# Patient Record
Sex: Male | Born: 1935
Health system: Southern US, Community
[De-identification: ages and names within clinical notes are randomized; demographics above are authoritative.]

## PROBLEM LIST (undated history)

## (undated) DIAGNOSIS — I4891 Unspecified atrial fibrillation: Secondary | ICD-10-CM

## (undated) DIAGNOSIS — I4892 Unspecified atrial flutter: Secondary | ICD-10-CM

## (undated) DIAGNOSIS — G4733 Obstructive sleep apnea (adult) (pediatric): Secondary | ICD-10-CM

## (undated) DIAGNOSIS — E785 Hyperlipidemia, unspecified: Secondary | ICD-10-CM

## (undated) DIAGNOSIS — C801 Malignant (primary) neoplasm, unspecified: Secondary | ICD-10-CM

## (undated) DIAGNOSIS — I499 Cardiac arrhythmia, unspecified: Secondary | ICD-10-CM

## (undated) DIAGNOSIS — M1990 Unspecified osteoarthritis, unspecified site: Secondary | ICD-10-CM

## (undated) HISTORY — DX: Unspecified atrial flutter: I48.92

## (undated) HISTORY — DX: Hyperlipidemia, unspecified: E78.5

## (undated) HISTORY — PX: MELANOMA EXCISION: SHX5266

## (undated) HISTORY — PX: CARDIAC CATHETERIZATION: SHX172

## (undated) HISTORY — DX: Obstructive sleep apnea (adult) (pediatric): G47.33

## (undated) HISTORY — DX: Unspecified atrial fibrillation: I48.91

## (undated) HISTORY — PX: ATRIAL ABLATION SURGERY: SHX560

---

## 2005-10-30 ENCOUNTER — Encounter: Payer: Self-pay | Admitting: Cardiology

## 2005-11-11 ENCOUNTER — Ambulatory Visit: Payer: Self-pay | Admitting: Cardiology

## 2005-11-11 ENCOUNTER — Encounter: Payer: Self-pay | Admitting: Physician Assistant

## 2007-12-16 ENCOUNTER — Encounter: Payer: Self-pay | Admitting: Cardiology

## 2008-09-23 ENCOUNTER — Encounter: Payer: Self-pay | Admitting: Cardiology

## 2008-09-29 ENCOUNTER — Encounter: Payer: Self-pay | Admitting: Cardiology

## 2009-02-25 DIAGNOSIS — E785 Hyperlipidemia, unspecified: Secondary | ICD-10-CM

## 2009-02-25 DIAGNOSIS — I4891 Unspecified atrial fibrillation: Secondary | ICD-10-CM | POA: Insufficient documentation

## 2009-02-25 DIAGNOSIS — E7800 Pure hypercholesterolemia, unspecified: Secondary | ICD-10-CM | POA: Insufficient documentation

## 2009-02-25 DIAGNOSIS — I10 Essential (primary) hypertension: Secondary | ICD-10-CM | POA: Insufficient documentation

## 2009-02-25 DIAGNOSIS — G4733 Obstructive sleep apnea (adult) (pediatric): Secondary | ICD-10-CM

## 2009-03-01 ENCOUNTER — Ambulatory Visit: Payer: Self-pay | Admitting: Cardiology

## 2010-03-29 NOTE — Letter (Signed)
Summary: Discharge Summary/ [facility]  Discharge Summary/ [facility]   Imported By: Dorise Hiss 03/01/2009 11:56:24  _____________________________________________________________________  External Attachment:    Type:   Image     Comment:   External Document

## 2010-03-29 NOTE — Letter (Signed)
Summary: Internal Other/ PATIENT HISTORY FORM  Internal Other/ PATIENT HISTORY FORM   Imported By: Dorise Hiss 03/01/2009 11:51:05  _____________________________________________________________________  External Attachment:    Type:   Image     Comment:   External Document

## 2010-03-29 NOTE — Assessment & Plan Note (Signed)
Summary: NP-AFIB   Visit Type:  Initial Consult Primary Provider:  Dr. Fara Chute  CC:  Atrial Fibrillation.  History of Present Illness: The patient presents for followup of atrial fibrillation. He has had this problem paroxysmally for years. He has only had 3 symptomatic episodes. At one point he was treated with propafenon and and Cardizem.  However, he developed a rash and both of these medications were stopped. He also felt lethargic on beta blockers. He has since been treated with anticoagulation alone. He did have a brief episode recently of feeling slightly lightheaded and noted that he had an irregular pulse with rates in the 100 range. He rested and this lasted for only about an hour. He had no presyncope or syncope. He is active in his daily chores though he does not exercise routinely. With his activities he denies any chest pressure, neck or arm discomfort. He does not have any shortness of breath and denies any PND or orthopnea. He does have sleep apnea and cannot use CPAP.  Preventive Screening-Counseling & Management  Alcohol-Tobacco     Smoking Status: never  Current Medications (verified): 1)  Pravastatin Sodium 20 Mg Tabs (Pravastatin Sodium) .... Take One Tablet By Mouth Daily At Bedtime 2)  Warfarin Sodium 5 Mg Tabs (Warfarin Sodium) .... Use As Directed By Dr. Neita Carp 3)  Multivitamins  Tabs (Multiple Vitamin) .... Take 1 Tablet By Mouth Once A Day 4)  Fish Oil 1000 Mg Caps (Omega-3 Fatty Acids) .... Take 1 Capsule By Mouth Once A Day 5)  Aleve 220 Mg Tabs (Naproxen Sodium) .... As Needed Gout  Allergies (verified): 1)  Lisinopril 2)  Lovenox (Enoxaparin Sodium) 3)  Propafenone Hcl 4)  Cardizem 5)  Metoprolol Succinate  Comments:  Nurse/Medical Assistant: The patient's medications and allergies were reviewed with the patient and were updated in the Medication and Allergy Lists. Pt brought a list.  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   Vital Signs:  Patient profile:   77 year old male Height:      70 inches Weight:      210.50 pounds BMI:     30.31 Pulse rate:   67 / minute BP sitting:   169 / 81  (left arm) Cuff size:   regular  Vitals Entered By: Cyril Loosen, RN, BSN (March 01, 2009 10:09 AM)  Nutrition Counseling: Patient's BMI is greater than 25 and therefore counseled on weight management options. CC: Atrial Fibrillation   Physical Exam  General:  Well developed, well nourished, in no acute distress. Head:  normocephalic and atraumatic Eyes:  PERRLA/EOM intact; conjunctiva and lids normal. Mouth:  Teeth, gums and palate normal. Oral mucosa normal. Neck:  Neck supple, no JVD. No masses, thyromegaly or abnormal cervical nodes. Chest Wall:  no deformities or breast masses noted Lungs:  Clear bilaterally to auscultation and percussion. Abdomen:  Bowel sounds positive; abdomen soft and non-tender without masses, organomegaly, or hernias noted. No hepatosplenomegaly. Msk:  Back normal, normal gait. Muscle strength and tone normal. Extremities:  No clubbing or cyanosis. Neurologic:  Alert and oriented x  3. Skin:  Intact without lesions or rashes. Cervical Nodes:  no significant adenopathy Axillary Nodes:  no significant adenopathy Inguinal Nodes:  no significant adenopathy Psych:  Normal affect.   Detailed Cardiovascular Exam  Neck    Carotids: Carotids full and equal bilaterally without bruits.      Neck Veins: Normal, no JVD.    Heart    Inspection: no deformities or lifts noted.       Palpation: normal PMI with no thrills palpable.      Auscultation: regular rate and rhythm, S1, S2 without murmurs, rubs, gallops, or clicks.    Vascular    Abdominal Aorta: no palpable masses, pulsations, or audible bruits.      Femoral Pulses: normal femoral pulses bilaterally.      Pedal Pulses: normal pedal pulses bilaterally.      Radial Pulses: normal radial pulses bilaterally.      Peripheral Circulation: no clubbing, cyanosis, or edema noted with normal capillary refill.     EKG  Procedure date:  03/01/2009  Findings:      Sinus rhythm, rate 62, axis within normal limits, intervals within normal limits, no acute ST-T wave changes.  Impression & Recommendations:  Problem # 1:  ATRIAL FIBRILLATION (ICD-427.31) The patient has infrequent symptomatic paroxysms of atrial fibrillation. At this point he and I agree that no antiarrhythmic therapy is needed. Rather we will continue with anticoagulation and will let me know if he has increasing paroxysms at which point I would most likely consider therapy with Tikosyn.  Of note I did discuss with him the option of Pradaxa therapy. He will investigate the cost of this and consider this. Orders: EKG w/ Interpretation (93000)  Problem # 2:  HYPERTENSION (ICD-401.9) His blood pressure is elevated today. This is above his baseline. I will defer any med changes to his primary physician but asked him to keep an eye on this and let Dr. Neita Carp know if he used routinely above 140/90.  Problem # 3:  OBSTRUCTIVE SLEEP APNEA (ICD-327.23) He cannot tolerate CPAP. We discussed weight loss and I prescribed for him an exercise regimen.  Patient Instructions: 1)  Your physician recommends that you continue on your current medications as directed. Please refer to the Current Medication list given to you today. PRADAXA is the new drug your doctor discussed with you today. 2)  No follow up needed.

## 2010-05-10 ENCOUNTER — Encounter: Payer: Self-pay | Admitting: Cardiology

## 2010-05-10 ENCOUNTER — Ambulatory Visit (INDEPENDENT_AMBULATORY_CARE_PROVIDER_SITE_OTHER): Payer: Medicare Other | Admitting: Cardiology

## 2010-05-10 DIAGNOSIS — I4892 Unspecified atrial flutter: Secondary | ICD-10-CM | POA: Insufficient documentation

## 2010-05-17 ENCOUNTER — Telehealth: Payer: Self-pay | Admitting: *Deleted

## 2010-05-17 NOTE — Telephone Encounter (Signed)
Pt called for results of holter monitor. He is aware results have just become available. Pt is aware he will be called when results reviewed. Pt is going out of town and asks for return call on cell phone.

## 2010-05-17 NOTE — Assessment & Plan Note (Signed)
Summary: 1 yr fu per reminder -vs   Visit Type:  Follow-up Primary Provider:  Dr. Fara Chute  CC:  Atrial Fibrillation.  History of Present Illness: The patient presents for followup of atrial fibrillation. He actually has noticed that his heart rate and more easily be in the 80-100 range than it used to be. This has been for weeks or months. However, he hasn't noticed any significant consistently elevated tachycardia arrhythmias and he does not feel palpitations, presyncope or syncope. He has had not any acute symptoms that he had previously apparently was fibrillation. He is in flutter today on his EKG. He has been under stress caring for his mother-in-law [place]. He does take Coumadin. He denies any chest pain, neck or arm discomfort. He doesn't feel any PND or orthopnea.  Preventive Screening-Counseling & Management  Alcohol-Tobacco     Smoking Status: never  Current Medications (verified): 1)  Pravastatin Sodium 20 Mg Tabs (Pravastatin Sodium) .... Take One Tablet By Mouth Daily At Bedtime 2)  Warfarin Sodium 5 Mg Tabs (Warfarin Sodium) .... Use As Directed By Dr. Neita Carp 3)  Multivitamins  Tabs (Multiple Vitamin) .... Take 1 Tablet By Mouth Once A Day 4)  Fish Oil 1000 Mg Caps (Omega-3 Fatty Acids) .... Take 1 Capsule By Mouth Once A Day 5)  Aleve 220 Mg Tabs (Naproxen Sodium) .... As Needed Gout 6)  Metoprolol Tartrate 50 Mg Tabs (Metoprolol Tartrate) .... Take One By Mouth Every Twelve Hours As Needed  Allergies (verified): 1)  Lisinopril 2)  Lovenox (Enoxaparin Sodium) 3)  Propafenone Hcl 4)  Cardizem 5)  Metoprolol Succinate  Comments:  Nurse/Medical Assistant: The patient's medication bottles and allergies were reviewed with the patient and were updated in the Medication and Allergy Lists.  Past History:  Past Medical History: Reviewed history from 03/01/2009 and no changes required. 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 Cardiac Catheterization   ( Nonobstructive coronary artery disease 2000) Melanoma resected from his right shoulder  Review of Systems       As stated in the HPI and negative for all other systems.   Vital Signs:  Patient profile:   77 year old male Height:      70 inches Weight:      207 pounds BMI:     29.81 O2 Sat:      98 % on Room air Pulse rate:   95 / minute BP sitting:   119 / 73  (left arm) Cuff size:   large  Vitals Entered By: Carlye Grippe (May 10, 2010 10:11 AM)  Nutrition Counseling: Patient's BMI is greater than 25 and therefore counseled on weight management options.  O2 Flow:  Room air  Physical Exam  General:  Well developed, well nourished, in no acute distress. Head:  normocephalic and atraumatic Eyes:  PERRLA/EOM intact; conjunctiva and lids normal. Mouth:  Teeth, gums and palate normal. Oral mucosa normal. Neck:  Neck supple, no JVD. No masses, thyromegaly or abnormal cervical nodes. Chest Wall:  no deformities or breast masses noted Lungs:  Clear bilaterally to auscultation and percussion. Heart:  Non-displaced PMI, chest non-tender;ir regular rate and rhythm, S1, S2 without murmurs, rubs or gallops. Carotid upstroke normal, no bruit. Normal abdominal aortic size, no bruits. Femorals normal pulses, no bruits. Pedals normal pulses. No edema, no varicosities. Abdomen:  Bowel  sounds positive; abdomen soft and non-tender without masses, organomegaly, or hernias noted. No hepatosplenomegaly. Msk:  Back normal, normal gait. Muscle strength and tone normal. Extremities:  No clubbing or cyanosis. Neurologic:  Alert and oriented x 3. Skin:  Intact without lesions or rashes. Cervical Nodes:  no significant adenopathy Inguinal Nodes:  no significant  adenopathy Psych:  Normal affect.   EKG  Procedure date:  05/10/2010  Findings:      atrial flutter with variable conduction, axis within normal limits, intervals within normal limits, no acute ST-T wave changes.  Impression & Recommendations:  Problem # 1:  ATRIAL FLUTTER (ICD-427.32)  The patient is in flutter. Pedal place at 48 hour Holter monitor to see if this is persistent. I will see whether he has good rate control and whether there is evidence of fibrillation. He will remain on Coumadin. It is only flutter we may discuss flutter ablation in the future. He does not want to do this at this time as he is traveling back and forth to care for his family.   His updated medication list for this problem includes:    Warfarin Sodium 5 Mg Tabs (Warfarin sodium) ..... Use as directed by dr. Neita Carp    Metoprolol Tartrate 50 Mg Tabs (Metoprolol tartrate) .Marland Kitchen... Take one by mouth every twelve hours as needed  Orders: Holter Monitor (Holter Monitor)  Problem # 2:  HYPERTENSION (ICD-401.9)  His blood pressure is controlled. He does have p.r.n. beta blocker and has tolerated this. I likely will need some better rate control after looking at his monitor.  His updated medication list for this problem includes:    Metoprolol Tartrate 50 Mg Tabs (Metoprolol tartrate) .Marland Kitchen... Take one by mouth every twelve hours as needed  His updated medication list for this problem includes:    Metoprolol Tartrate 50 Mg Tabs (Metoprolol tartrate) .Marland Kitchen... Take one by mouth every twelve hours as needed  Problem # 3:  HYPERLIPIDEMIA (ICD-272.4)  he will continue to have this followed by his primary provider.  His updated medication list for this problem includes:    Pravastatin Sodium 20 Mg Tabs (Pravastatin sodium) .Marland Kitchen... Take one tablet by mouth daily at bedtime  His updated medication list for this problem includes:    Pravastatin Sodium 20 Mg Tabs (Pravastatin sodium) .Marland Kitchen... Take one tablet by mouth daily  at bedtime  Other Orders: EKG w/ Interpretation (93000)  Patient Instructions: 1)  Follow up with Dr. Antoine Poche on MONDAY, June 27, 2010 at 10:30am. 2)  Your physician recommends that you continue on your current medications as directed. Please refer to the Current Medication list given to you today. 3)  Your physician has recommended that you wear a holter monitor.  Holter monitors are medical devices that record the heart's electrical activity. Doctors most often use these monitors to diagnose arrhythmias. Arrhythmias are problems with the speed or rhythm of the heartbeat. The monitor is a small, portable device. You can wear one while you do your normal daily activities. This is usually used to diagnose what is causing palpitations/syncope (passing out).

## 2010-05-18 ENCOUNTER — Telehealth: Payer: Self-pay | Admitting: *Deleted

## 2010-05-18 NOTE — Telephone Encounter (Signed)
Per Gene Holter monitor showed, A.Flutter w/HR 48-127, mean HR 96, no significant pauses. Pt notified of results and verbalized understanding.

## 2010-06-06 ENCOUNTER — Encounter: Payer: Self-pay | Admitting: Physician Assistant

## 2010-06-27 ENCOUNTER — Ambulatory Visit: Payer: Medicare Other | Admitting: Cardiology

## 2010-06-28 ENCOUNTER — Ambulatory Visit: Payer: Medicare Other | Admitting: Cardiology

## 2010-07-19 ENCOUNTER — Ambulatory Visit: Payer: Medicare Other | Admitting: Cardiology

## 2010-08-08 ENCOUNTER — Encounter: Payer: Self-pay | Admitting: Cardiology

## 2010-08-16 ENCOUNTER — Ambulatory Visit (INDEPENDENT_AMBULATORY_CARE_PROVIDER_SITE_OTHER): Payer: Medicare Other | Admitting: Cardiology

## 2010-08-16 ENCOUNTER — Encounter: Payer: Self-pay | Admitting: Cardiology

## 2010-08-16 VITALS — BP 130/91 | HR 67 | Ht 70.0 in | Wt 207.0 lb

## 2010-08-16 DIAGNOSIS — E7800 Pure hypercholesterolemia, unspecified: Secondary | ICD-10-CM

## 2010-08-16 DIAGNOSIS — I4891 Unspecified atrial fibrillation: Secondary | ICD-10-CM

## 2010-08-16 DIAGNOSIS — G4733 Obstructive sleep apnea (adult) (pediatric): Secondary | ICD-10-CM

## 2010-08-16 DIAGNOSIS — I4892 Unspecified atrial flutter: Secondary | ICD-10-CM

## 2010-08-16 DIAGNOSIS — I10 Essential (primary) hypertension: Secondary | ICD-10-CM

## 2010-08-16 NOTE — Assessment & Plan Note (Signed)
This is followed by Estanislado Pandy, MD

## 2010-08-16 NOTE — Patient Instructions (Signed)
   Follow up with Dr. Johney Frame as scheduled.   Follow up with Dr. Antoine Poche as needed. Your physician has requested that you have an echocardiogram. Echocardiography is a painless test that uses sound waves to create images of your heart. It provides your doctor with information about the size and shape of your heart and how well your heart's chambers and valves are working. This procedure takes approximately one hour. There are no restrictions for this procedure. Your physician recommends that you continue on your current medications as directed. Please refer to the Current Medication list given to you today.

## 2010-08-16 NOTE — Progress Notes (Signed)
HPI  Allergies  Allergen Reactions  . Diltiazem Hcl     REACTION: Looked like burned all over  . Lisinopril     REACTION: Looked like burned all over  . Metoprolol Succinate     REACTION: Looked like burned all over  . Propafenone Hcl     REACTION: Looked like burned all over    Current Outpatient Prescriptions  Medication Sig Dispense Refill  . metoprolol (LOPRESSOR) 50 MG tablet Take 50 mg by mouth. Take every 12 hours as needed       . Multiple Vitamins-Minerals (MULTIVITAL) tablet Take 1 tablet by mouth daily.        . naproxen sodium (ALEVE) 220 MG tablet Take 220 mg by mouth. As needed gout       . Omega-3 Fatty Acids (FISH OIL) 1000 MG CAPS Take by mouth daily.        . pravastatin (PRAVACHOL) 20 MG tablet Take 20 mg by mouth daily.        Marland Kitchen warfarin (COUMADIN) 5 MG tablet Take 5 mg by mouth. Use as directed by Dr. Neita Carp.         Past Medical History  Diagnosis Date  . Atrial fibrillation   . Unspecified essential hypertension   . Other and unspecified hyperlipidemia   . Other specified cardiac dysrhythmias   . Obstructive sleep apnea (adult) (pediatric)     Past Surgical History  Procedure Date  . Cardiac catheterization     nonobstructive CAD 2000  . Melanoma (other)     resected from his right shoulder    ROS:  As stated in the HPI and negative for all other systems.  PHYSICAL EXAM BP 130/91  Pulse 67  Ht 5\' 10"  (1.778 m)  Wt 207 lb (93.895 kg)  BMI 29.70 kg/m2 GENERAL:  Well appearing HEENT:  Pupils equal round and reactive, fundi not visualized, oral mucosa unremarkable NECK:  No jugular venous distention, waveform within normal limits, carotid upstroke brisk and symmetric, no bruits, no thyromegaly LYMPHATICS:  No cervical, inguinal adenopathy LUNGS:  Clear to auscultation bilaterally BACK:  No CVA tenderness CHEST:  Unremarkable HEART:  PMI not displaced or sustained,S1 and S2 within normal limits, no S3, no S4, no clicks, no rubs, no  murmurs ABD:  Flat, positive bowel sounds normal in frequency in pitch, no bruits, no rebound, no guarding, no midline pulsatile mass, no hepatomegaly, no splenomegaly EXT:  2 plus pulses throughout, no edema, no cyanosis no clubbing SKIN:  No rashes no nodules NEURO:  Cranial nerves II through XII grossly intact, motor grossly intact throughout PSYCH:  Cognitively intact, oriented to person place and time   ASSESSMENT AND PLAN

## 2010-08-16 NOTE — Assessment & Plan Note (Signed)
The patient want to discuss ablation.  I will check and echo and make an appt with Dr. Johney Frame.

## 2010-08-16 NOTE — Assessment & Plan Note (Signed)
The blood pressure is at target. No change in medications is indicated. We will continue with therapeutic lifestyle changes (TLC).  

## 2010-08-16 NOTE — Progress Notes (Signed)
HPI The patient presents for follow up of atrial flutter.  Since I last saw him he has worn a holter which demonstrated only atrial flutter. He has remained on Coumadin.  He will occasionally feel palpitations but has no presyncope or syncope. He is not describing chest pressure, neck or arm discomfort. He is not describing any new shortness of breath, PND or orthopnea. He has had no weight gain or edema. Socially  he is a light settling down as he is elderly mother-in-law is in better health than she was previously. He thinks that he is in a position now to talk about flutter ablation.  Allergies  Allergen Reactions  . Diltiazem Hcl     REACTION: Looked like burned all over  . Lisinopril     REACTION: Looked like burned all over  . Metoprolol Succinate     REACTION: Looked like burned all over  . Propafenone Hcl     REACTION: Looked like burned all over    Current Outpatient Prescriptions  Medication Sig Dispense Refill  . metoprolol (LOPRESSOR) 50 MG tablet Take 50 mg by mouth. Take every 12 hours as needed       . Multiple Vitamins-Minerals (MULTIVITAL) tablet Take 1 tablet by mouth daily.        . naproxen sodium (ALEVE) 220 MG tablet Take 220 mg by mouth. As needed gout       . Omega-3 Fatty Acids (FISH OIL) 1000 MG CAPS Take by mouth daily.        . pravastatin (PRAVACHOL) 20 MG tablet Take 20 mg by mouth daily.        Marland Kitchen warfarin (COUMADIN) 5 MG tablet Take 5 mg by mouth. Use as directed by Dr. Neita Carp.         Past Medical History  Diagnosis Date  . Atrial fibrillation   . Unspecified essential hypertension   . Other and unspecified hyperlipidemia   . Other specified cardiac dysrhythmias   . Obstructive sleep apnea (adult) (pediatric)     Past Surgical History  Procedure Date  . Cardiac catheterization     nonobstructive CAD 2000  . Melanoma (other)     resected from his right shoulder    ROS:  As stated in the HPI and negative for all other systems.  PHYSICAL  EXAM BP 130/91  Pulse 67  Ht 5\' 10"  (1.778 m)  Wt 207 lb (93.895 kg)  BMI 29.70 kg/m2 GENERAL:  Well appearing HEENT:  Pupils equal round and reactive, fundi not visualized, oral mucosa unremarkable NECK:  No jugular venous distention, waveform within normal limits, carotid upstroke brisk and symmetric, no bruits, no thyromegaly LYMPHATICS:  No cervical, inguinal adenopathy LUNGS:  Clear to auscultation bilaterally BACK:  No CVA tenderness CHEST:  Unremarkable HEART:  PMI not displaced or sustained,S1 and S2 within normal limits, no S3, no S4, no clicks, no rubs, no murmurs, irregular ABD:  Flat, positive bowel sounds normal in frequency in pitch, no bruits, no rebound, no guarding, no midline pulsatile mass, no hepatomegaly, no splenomegaly EXT:  2 plus pulses throughout, no edema, no cyanosis no clubbing SKIN:  No rashes no nodules NEURO:  Cranial nerves II through XII grossly intact, motor grossly intact throughout PSYCH:  Cognitively intact, oriented to person place and time  ASSESSMENT AND PLAN

## 2010-09-01 ENCOUNTER — Other Ambulatory Visit: Payer: Medicare Other | Admitting: *Deleted

## 2010-09-02 ENCOUNTER — Other Ambulatory Visit: Payer: Self-pay | Admitting: Cardiology

## 2010-09-02 DIAGNOSIS — I4892 Unspecified atrial flutter: Secondary | ICD-10-CM

## 2010-09-07 ENCOUNTER — Other Ambulatory Visit (INDEPENDENT_AMBULATORY_CARE_PROVIDER_SITE_OTHER): Payer: Medicare Other | Admitting: *Deleted

## 2010-09-07 DIAGNOSIS — I4892 Unspecified atrial flutter: Secondary | ICD-10-CM

## 2010-09-08 ENCOUNTER — Encounter: Payer: Self-pay | Admitting: Internal Medicine

## 2010-09-12 ENCOUNTER — Ambulatory Visit (INDEPENDENT_AMBULATORY_CARE_PROVIDER_SITE_OTHER): Payer: Medicare Other | Admitting: Internal Medicine

## 2010-09-12 ENCOUNTER — Encounter: Payer: Self-pay | Admitting: Internal Medicine

## 2010-09-12 DIAGNOSIS — G4733 Obstructive sleep apnea (adult) (pediatric): Secondary | ICD-10-CM

## 2010-09-12 DIAGNOSIS — I10 Essential (primary) hypertension: Secondary | ICD-10-CM

## 2010-09-12 DIAGNOSIS — I4891 Unspecified atrial fibrillation: Secondary | ICD-10-CM

## 2010-09-12 DIAGNOSIS — I4892 Unspecified atrial flutter: Secondary | ICD-10-CM

## 2010-09-12 NOTE — Assessment & Plan Note (Signed)
The patient presents today for EP consultation regarding atrial arrhythmias.  Though he presents today in typical appearing atrial flutter, he has also had paroxysmal afib in the past.  He has been intolerant to propafenone and diltiazem in the past.  He is quite symptomatic with his atrial arrhythmias (afib more so than atrial flutter). Therapeutic strategies for both atrial flutter and afib including medicine and ablation were discussed in detail with the patient today.  Given h/o both afib and atrial flutter as well as poor tolerance to drug therapy previously, I think that the most prudent strategy would be to proceed with both CTI ablation and also concominant pulmonary vein isolation.  Risk, benefits, and alternatives to EP study and radiofrequency ablation for afib and atrial flutter were also discussed in detail today. These risks include but are not limited to stroke, bleeding, vascular damage, tamponade, perforation, damage to the esophagus, lungs, and other structures, pulmonary vein stenosis, AV block requiring pacemaker implantation, worsening renal function, and death. The patient understands these risk and wishes to proceed.  We will therefore proceed with catheter ablation at the next available time.

## 2010-09-12 NOTE — Progress Notes (Signed)
Bradley Short is a pleasant 77 y.o. WM patient with a h/o paroxysmal atrial fibrillation and atrial flutter who presents today for EP consultation.  He reports initially being diagnosed with atrial fibrillation in 2000 after developing diaphoresis and palpitations.  He presented to [facility] and was found to have atrial fibrillation and transferred him to [facility].  He underwent cath at that time which revealed no significant CAD.  He was placed on metoprolol but could not tolerate this medicine due to somnolence and fatigue.  He reports doing well until about 2007.  At that time, he returned to afib.  He was again evaluated at [facility] and placed on flecainide and diltiazem.  He developed a severe skin reaction and therefore these medicines were both discontinued 2007.  He has been chronically anticoagulated with coumadin since that time.  He did well off of antiarrhythmic drugs until recently presenting to [facility] office 3/12.  At that time, he was found to have atrial flutter. He reports symptoms of occasional "warmth" and palpitations.  He feels that his exercise tolerance is also decreased.   During episodes of afib, he has been much more symptomatic.  During afib, he reports tachy palpitations with diaphoresis and significantly decreased exercise tolerance.  Today, he denies symptoms of palpitations, chest pain, shortness of breath, orthopnea, PND, lower extremity edema, dizziness, presyncope, syncope, or neurologic sequela. The patient is tolerating medications without difficulties and is otherwise without complaint today.   Past Medical History  Diagnosis Date  . Atrial flutter     typical by ekg  . Hypertension   . Hyperlipidemia   . Obstructive sleep apnea (adult) (pediatric)     on CPAP  . Atrial fibrillation     paroxysmal   Past Surgical History  Procedure Date  . Cardiac catheterization     nonobstructive CAD 2000  . Melanoma (other)    resected from his right shoulder    Current Outpatient Prescriptions  Medication Sig Dispense Refill  . metoprolol (LOPRESSOR) 50 MG tablet Take 50 mg by mouth. Take every 12 hours as needed       . Multiple Vitamins-Minerals (MULTIVITAL) tablet Take 1 tablet by mouth daily.        . naproxen sodium (ALEVE) 220 MG tablet Take 220 mg by mouth. As needed gout       . Omega-3 Fatty Acids (FISH OIL) 1000 MG CAPS Take by mouth daily.        . pravastatin (PRAVACHOL) 20 MG tablet Take 20 mg by mouth daily.        Marland Kitchen warfarin (COUMADIN) 5 MG tablet Take 5 mg by mouth. Use as directed by Dr. Neita Carp.         Allergies  Allergen Reactions  . Diltiazem Hcl     REACTION: Looked like burned all over  . Lisinopril     REACTION: Looked like burned all over  . Metoprolol Succinate     REACTION: Looked like burned all over  . Propafenone Hcl     REACTION: Looked like burned all over    History   Social History  . Marital Status: Single    Spouse Name: N/A    Number of Children: N/A  . Years of Education: N/A   Occupational History  . Not on file.   Social History Main Topics  . Smoking status: Never Smoker   . Smokeless tobacco: Never Used  . Alcohol Use: No  . Drug Use: No  .  Sexually Active: Not on file   Other Topics Concern  . Not on file   Social History Narrative   Lives in [place].  Retired Dealer    Family History  Problem Relation Age of Onset  . Prostate cancer Father   . Seizures Other   . Cancer Other   . Diabetes Other   . Heart disease Other     ROS- All systems are reviewed and negative except as per the HPI above  Physical Exam: Filed Vitals:   09/12/10 1512  BP: 108/82  Pulse: 125  Resp: 16  Height: 5\' 10"  (1.778 m)  Weight: 208 lb (94.348 kg)    GEN- The patient is well appearing, alert and oriented x 3 today.   Head- normocephalic, atraumatic Eyes-  Sclera clear, conjunctiva pink Ears- hearing intact Oropharynx- clear Neck- supple, no  JVP Lymph- no cervical lymphadenopathy Lungs- Clear to ausculation bilaterally, normal work of breathing Heart- irregular rate and rhythm, no murmurs, rubs or gallops, PMI not laterally displaced GI- soft, NT, ND, + BS Extremities- no clubbing, cyanosis, or edema MS- no significant deformity or atrophy Skin- no rash or lesion Psych- euthymic mood, full affect Neuro- strength and sensation are intact  EKG today reveals typical appearing atrial flutter with 2:1 AV conduction  Echo 09/07/10- LVEF 65-70%, LA 42mm, no significant valvular disease    Assessment and Plan:

## 2010-09-12 NOTE — Assessment & Plan Note (Signed)
As above.

## 2010-09-12 NOTE — Assessment & Plan Note (Signed)
The importance of CPAP therapy was discussed today.

## 2010-09-12 NOTE — Assessment & Plan Note (Signed)
Stable No change required today  

## 2010-09-13 ENCOUNTER — Telehealth: Payer: Self-pay | Admitting: Internal Medicine

## 2010-09-13 NOTE — Telephone Encounter (Signed)
Pt wants to know if the procedures for July 30 and July 31 have been scheduled.  Please call them and let them  Know.  [phone]    Cell [phone]

## 2010-09-13 NOTE — Telephone Encounter (Signed)
patient wife aware of time for procedures on 09/27/10  TEE at 10 and ablation to follow  Labs on 09/20/10 at 9:30

## 2010-09-20 ENCOUNTER — Other Ambulatory Visit (INDEPENDENT_AMBULATORY_CARE_PROVIDER_SITE_OTHER): Payer: Medicare Other | Admitting: *Deleted

## 2010-09-20 ENCOUNTER — Encounter: Payer: Self-pay | Admitting: *Deleted

## 2010-09-20 DIAGNOSIS — I4891 Unspecified atrial fibrillation: Secondary | ICD-10-CM

## 2010-09-20 LAB — PROTIME-INR
INR: 2.4 ratio — ABNORMAL HIGH (ref 0.8–1.0)
Prothrombin Time: 25 s — ABNORMAL HIGH (ref 10.2–12.4)

## 2010-09-20 LAB — BASIC METABOLIC PANEL
BUN: 14 mg/dL (ref 6–23)
CO2: 29 mEq/L (ref 19–32)
Calcium: 9 mg/dL (ref 8.4–10.5)
Chloride: 109 mEq/L (ref 96–112)
Creatinine, Ser: 1.4 mg/dL (ref 0.4–1.5)

## 2010-09-20 LAB — CBC WITH DIFFERENTIAL/PLATELET
Eosinophils Absolute: 0.1 10*3/uL (ref 0.0–0.7)
MCHC: 34.1 g/dL (ref 30.0–36.0)
MCV: 93.8 fl (ref 78.0–100.0)
Monocytes Absolute: 0.5 10*3/uL (ref 0.1–1.0)
Neutrophils Relative %: 76.1 % (ref 43.0–77.0)
Platelets: 135 10*3/uL — ABNORMAL LOW (ref 150.0–400.0)
WBC: 9.3 10*3/uL (ref 4.5–10.5)

## 2010-09-27 ENCOUNTER — Ambulatory Visit (HOSPITAL_COMMUNITY)
Admission: RE | Admit: 2010-09-27 | Discharge: 2010-09-28 | Disposition: A | Discharge status: Home / Self Care | Payer: Medicare Other | Source: Ambulatory Visit | Attending: Internal Medicine | Admitting: Internal Medicine

## 2010-09-27 DIAGNOSIS — I4892 Unspecified atrial flutter: Secondary | ICD-10-CM

## 2010-09-27 DIAGNOSIS — I10 Essential (primary) hypertension: Secondary | ICD-10-CM | POA: Insufficient documentation

## 2010-09-27 DIAGNOSIS — E785 Hyperlipidemia, unspecified: Secondary | ICD-10-CM | POA: Insufficient documentation

## 2010-09-27 DIAGNOSIS — Z7901 Long term (current) use of anticoagulants: Secondary | ICD-10-CM | POA: Insufficient documentation

## 2010-09-27 DIAGNOSIS — G4733 Obstructive sleep apnea (adult) (pediatric): Secondary | ICD-10-CM | POA: Insufficient documentation

## 2010-09-27 DIAGNOSIS — I4891 Unspecified atrial fibrillation: Secondary | ICD-10-CM

## 2010-09-27 LAB — POCT ACTIVATED CLOTTING TIME
Activated Clotting Time: 331 seconds
Activated Clotting Time: 292 seconds
Activated Clotting Time: 171 seconds

## 2010-09-28 DIAGNOSIS — R072 Precordial pain: Secondary | ICD-10-CM

## 2010-09-28 LAB — PROTIME-INR
INR: 2.34 — ABNORMAL HIGH (ref 0.00–1.49)
Prothrombin Time: 26 seconds — ABNORMAL HIGH (ref 11.6–15.2)

## 2010-09-29 ENCOUNTER — Telehealth: Payer: Self-pay | Admitting: Internal Medicine

## 2010-09-29 NOTE — Telephone Encounter (Signed)
Per pt wife calling, pt needs [place] to call Dr. Neita Carp office at [place] [phone]. Pt needs to get a PT within a week. Please return pt wife call to advise/discuss.

## 2010-09-29 NOTE — Telephone Encounter (Signed)
Faxed request for PT to dr sasser office for next week tues.--pt's wife notified--nt

## 2010-10-11 ENCOUNTER — Inpatient Hospital Stay (HOSPITAL_COMMUNITY)
Admission: EM | Admit: 2010-10-11 | Discharge: 2010-10-13 | DRG: 309 | Disposition: A | Discharge status: Home / Self Care | Payer: Medicare Other | Attending: Internal Medicine | Admitting: Internal Medicine

## 2010-10-11 ENCOUNTER — Emergency Department (HOSPITAL_COMMUNITY): Payer: Medicare Other

## 2010-10-11 DIAGNOSIS — G4733 Obstructive sleep apnea (adult) (pediatric): Secondary | ICD-10-CM | POA: Diagnosis present

## 2010-10-11 DIAGNOSIS — Q211 Atrial septal defect: Secondary | ICD-10-CM

## 2010-10-11 DIAGNOSIS — Z85820 Personal history of malignant melanoma of skin: Secondary | ICD-10-CM

## 2010-10-11 DIAGNOSIS — D72829 Elevated white blood cell count, unspecified: Secondary | ICD-10-CM | POA: Diagnosis present

## 2010-10-11 DIAGNOSIS — I2510 Atherosclerotic heart disease of native coronary artery without angina pectoris: Secondary | ICD-10-CM | POA: Diagnosis present

## 2010-10-11 DIAGNOSIS — I4891 Unspecified atrial fibrillation: Secondary | ICD-10-CM

## 2010-10-11 DIAGNOSIS — Z79899 Other long term (current) drug therapy: Secondary | ICD-10-CM

## 2010-10-11 DIAGNOSIS — E785 Hyperlipidemia, unspecified: Secondary | ICD-10-CM | POA: Diagnosis present

## 2010-10-11 DIAGNOSIS — I4892 Unspecified atrial flutter: Secondary | ICD-10-CM | POA: Diagnosis present

## 2010-10-11 DIAGNOSIS — Q2111 Secundum atrial septal defect: Secondary | ICD-10-CM

## 2010-10-11 DIAGNOSIS — R002 Palpitations: Secondary | ICD-10-CM

## 2010-10-11 DIAGNOSIS — I10 Essential (primary) hypertension: Secondary | ICD-10-CM | POA: Diagnosis present

## 2010-10-11 DIAGNOSIS — Z7901 Long term (current) use of anticoagulants: Secondary | ICD-10-CM

## 2010-10-11 LAB — BASIC METABOLIC PANEL
BUN: 15 mg/dL (ref 6–23)
Calcium: 9.5 mg/dL (ref 8.4–10.5)
Creatinine, Ser: 1.11 mg/dL (ref 0.50–1.35)
GFR calc non Af Amer: >60 mL/min (ref >60)
Glucose, Bld: 94 mg/dL (ref 70–99)
Potassium: 4.1 mEq/L (ref 3.5–5.1)

## 2010-10-11 LAB — URINALYSIS, ROUTINE W REFLEX MICROSCOPIC
Bilirubin Urine: NEGATIVE
Nitrite: NEGATIVE
Protein, ur: NEGATIVE mg/dL
Urobilinogen, UA: 1 mg/dL (ref 0.0–1.0)

## 2010-10-11 LAB — CBC
Platelets: 221 10*3/uL (ref 150–400)
RBC: 4.65 MIL/uL (ref 4.22–5.81)
RDW: 14.6 % (ref 11.5–15.5)
WBC: 17.3 10*3/uL — ABNORMAL HIGH (ref 4.0–10.5)

## 2010-10-11 LAB — HEPATIC FUNCTION PANEL
AST: 31 U/L (ref 0–37)
Bilirubin, Direct: 0.3 mg/dL (ref 0.0–0.3)
Indirect Bilirubin: 0.7 mg/dL (ref 0.3–0.9)
Total Bilirubin: 1 mg/dL (ref 0.3–1.2)

## 2010-10-11 LAB — CK TOTAL AND CKMB (NOT AT ARMC)
Relative Index: INVALID (ref 0.0–2.5)
Total CK: 63 U/L (ref 7–232)

## 2010-10-11 LAB — TROPONIN I: Troponin I: <0.3 ng/mL (ref <0.30)

## 2010-10-11 LAB — URINE MICROSCOPIC-ADD ON

## 2010-10-11 LAB — DIFFERENTIAL
Basophils Absolute: 0 10*3/uL (ref 0.0–0.1)
Basophils Relative: 0 % (ref 0–1)
Eosinophils Absolute: 0.1 10*3/uL (ref 0.0–0.7)
Eosinophils Relative: 1 % (ref 0–5)
Neutrophils Relative %: 80 % — ABNORMAL HIGH (ref 43–77)

## 2010-10-11 LAB — PROTIME-INR
INR: 2.02 — ABNORMAL HIGH (ref 0.00–1.49)
Prothrombin Time: 23.2 seconds — ABNORMAL HIGH (ref 11.6–15.2)

## 2010-10-11 LAB — T4, FREE: Free T4: 1.18 ng/dL (ref 0.80–1.80)

## 2010-10-11 LAB — POCT I-STAT TROPONIN I: Troponin i, poc: 0.01 ng/mL (ref 0.00–0.08)

## 2010-10-12 LAB — CBC
HCT: 40.9 % (ref 39.0–52.0)
MCHC: 33.7 g/dL (ref 30.0–36.0)
MCV: 91.3 fL (ref 78.0–100.0)
RDW: 14.7 % (ref 11.5–15.5)
WBC: 13.1 10*3/uL — ABNORMAL HIGH (ref 4.0–10.5)

## 2010-10-12 LAB — CARDIAC PANEL(CRET KIN+CKTOT+MB+TROPI)
Relative Index: INVALID (ref 0.0–2.5)
Total CK: 51 U/L (ref 7–232)

## 2010-10-12 LAB — DIFFERENTIAL
Eosinophils Relative: 1 % (ref 0–5)
Lymphocytes Relative: 13 % (ref 12–46)
Lymphs Abs: 1.7 10*3/uL (ref 0.7–4.0)
Monocytes Absolute: 0.9 10*3/uL (ref 0.1–1.0)
Monocytes Relative: 7 % (ref 3–12)

## 2010-10-12 LAB — HEPARIN LEVEL (UNFRACTIONATED): Heparin Unfractionated: <0.1 IU/mL — ABNORMAL LOW (ref 0.30–0.70)

## 2010-10-13 LAB — HEPARIN LEVEL (UNFRACTIONATED): Heparin Unfractionated: 0.29 IU/mL — ABNORMAL LOW (ref 0.30–0.70)

## 2010-10-13 LAB — CBC
Hemoglobin: 12.4 g/dL — ABNORMAL LOW (ref 13.0–17.0)
MCHC: 33.5 g/dL (ref 30.0–36.0)

## 2010-10-13 LAB — PROTIME-INR: Prothrombin Time: 21.7 seconds — ABNORMAL HIGH (ref 11.6–15.2)

## 2010-10-14 ENCOUNTER — Telehealth: Payer: Self-pay | Admitting: Internal Medicine

## 2010-10-14 NOTE — Telephone Encounter (Signed)
Wife called and stated protime this am was 2.4.  He is not going to take his second shot of Lovenox today. Call back if there is anything else she needs to do.

## 2010-10-17 NOTE — Telephone Encounter (Signed)
Wife called and stated pt not doing well. Vital sign is fine. Not sleeping well. Pt on  Amiodarone 400 mg twice a day.

## 2010-10-17 NOTE — Telephone Encounter (Signed)
Discussed with Dr Johney Frame  Pt to decrease Amiodarone to 200mg  twice daily and see if this helps

## 2010-10-18 NOTE — Op Note (Signed)
  NAMEBRYNDAN, BILYK              ACCOUNT NO.:  [id number]  MEDICAL RECORD NO.:  [id number]  LOCATION:  2036                         FACILITY:  [facility]  PHYSICIAN:  Madolyn Frieze. Jens Som, MD, FACCDATE OF BIRTH:  Aug 02, 1932  DATE OF PROCEDURE:  10/12/2010 DATE OF DISCHARGE:                              OPERATIVE REPORT   This is cardioversion of atrial fibrillation.  A transesophageal echocardiogram prior to the procedure showed no left atrial appendage thrombus.  The patient was subsequently sedated by anesthesia with propofol 30 mg intravenously.  Synchronized cardioversion with 120 joules resulted in sinus rhythm.  There were no immediate complications. We would recommend continuing IV heparin until his INR is greater than or equal to 2.     Madolyn Frieze Jens Som, MD, [facility]     BSC/MEDQ  D:  10/12/2010  T:  10/12/2010  Job:  [id number]  Electronically Signed by Olga Millers MD [facility] on 10/18/2010 01:09:32 PM

## 2010-10-18 NOTE — Discharge Summary (Addendum)
NAMEMARKICE, TORBERT              ACCOUNT NO.:  [id number]  MEDICAL RECORD NO.:  [id number]  LOCATION:  2036                         FACILITY:  [facility]  PHYSICIAN:  Rollene Rotunda, MD, FACCDATE OF BIRTH:  1932/04/20  DATE OF ADMISSION:  10/11/2010 DATE OF DISCHARGE:  10/13/2010                              DISCHARGE SUMMARY   DISCHARGE DIAGNOSES: 1. Recurrent atrial fibrillation with rapid ventricular response.     a.     Status post TEE/DCCV October 12, 2010.     b.     Initiated on amiodarone.     c.     Chronically anticoagulated with Coumadin, subtherapeutic,      crossed over with Lovenox. 2. Atrial fibrillation/atrial flutter status post ablation of atrial     fibrillation and atrial flutter on September 27, 2010.  EP study     demonstrated typical isthmus and right atrial flutter successfully     ablated along the cavotricuspid isthmus as well as successful     pulmonary vein isolation of all 4 pulmonary veins using    radiofrequency current. 3. Hypertension. 4. Hyperlipidemia. 5. Obstructive sleep apnea on CPAP. 6. History of AVNRT. 7. Nonobstructive coronary artery disease by cardiac catheterization     in 2000. 8. Melanoma resection from his right shoulder. 9. Patent foramen ovale per TEE September 27, 2010.  HOSPITAL COURSE:  Mr. Elman is a 77 year old gentleman with history of atrial fibrillation and atrial flutter who recently underwent ablation on July 31.  He had general malaise the entire day prior to admission. This is the symptom he usually has when he is in atrial fibrillation, so we checked his blood pressure and heart rate.  Heart rate is elevated up to 120 at times.  __________ .  He had a regularly scheduled appointment with Dr. Neita Carp in his office when he expressed his concern, EKG was checked showing AFib with RVR.  Additionally his blood pressure was low. He was sent by EMS to [facility] and in the ER, his blood pressure improved.  His heart rate was  still in the 100-120s.  He endorsed questionable dyspnea on exertion versus shortness of breath. No chest pain was noted.  The patient was evaluated by Dr. Graciela Husbands and felt to benefit from a TEE cardioversion.  INR was therapeutic on admission, however, downtrended during this admission.  TEE demonstrated no left atrial appendage thrombus and the patient was cardioverted with 120 joules resulting in sinus rhythm.  He was continued on IV heparin given the subtherapeutic INR.  On the day of discharge, he was feeling better.  EKG was obtained which appears to be in a regular rhythm with atrial fibrillation, with occasional sinus beats.  Dr. Antoine Poche has seen and examined the patient today and felt he is stable for discharge, Lovenox crossover as an outpatient.  DISCHARGE LABS:  WBC 13.1, hematocrit 12.4, hematocrit 37, platelet count 198,000.  INR 1.85.  Electrolytes October 11, 2010, were totally normal with exception of albumin 3.3.  Cardiac enzymes negative x4.  TSH and free T4 were normal.  Initial white count was 17.3.  STUDIES: 1. Chest x-ray October 11, 2010, showed no evidence for active chest  disease. 2. TEE/cardioversion, please see full report for details.  DISCHARGE MEDICATIONS: 1. Amiodarone 200 mg 2 tablets b.i.d.  Per discussion with Dr.     Antoine Poche, the patient should be continued on his medicines and     follow up with Dr. Johney Frame for further advice. 2. Lovenox 90 mg q.12 h. subcutaneously. 3. Coumadin 5 mg.  The patient will take 1/2 tablet tonight.  He has     an appointment today.  His INR will be checked tomorrow morning. 4. Aleve 220 mg t.i.d. p.r.n. with instructions __________ while on     Coumadin. 5. Fish oil OTC 1 capsule b.i.d. 6. Metoprolol tartrate 50 mg q.12 h. p.r.n. for heart racing. 7. Multivitamin 1 tablet daily. 8. Pravastatin 20 mg daily. 9. Protonix 40 mg daily.  DISPOSITION:  Mr. Dewald will be discharged in stable condition to home. He is  instructed to increase activity slowly and follow a low sodium heart-healthy diet.  He will follow up with Dr. Dian Situ office August 17 at 9:45 a.m.  Our office will call him with the followup appointment with Dr. Johney Frame.  DURATION OF DISCHARGE ENCOUNTER:  Greater than 30 minutes including physician and PA time.     Ronie Spies, P.A.C.   ______________________________ Rollene Rotunda, MD, [facility]    DD/MEDQ  D:  10/13/2010  T:  10/13/2010  Job:  [id number]  cc:   Hillis Range, MD Rollene Rotunda, MD, [facility]  Electronically Signed by Rollene Rotunda MD [facility] on 10/18/2010 02:21:57 PM Electronically Signed by Ronie Spies  on 10/19/2010 10:01:46 AM

## 2010-10-20 ENCOUNTER — Telehealth: Payer: Self-pay | Admitting: Internal Medicine

## 2010-10-20 NOTE — Telephone Encounter (Signed)
Discussed Dr Johney Frame Pt to keep appt in 11/2010 and he is to decrease his Amiodarone to 200mg  daily aroung 11/14/10  This will be after 4 weeks of Amiodarone 200mg  bid  Pt wife aware and will call us with any problems

## 2010-10-20 NOTE — Telephone Encounter (Addendum)
Pt's wife calling to see when pt needs fu appt, had a 3 month fu in oct from his first hospital visit, but wanted to check since he was in hospital a second time, if that date needs to change, wife miriam cell [phone]

## 2010-10-20 NOTE — Op Note (Signed)
[name], Short              ACCOUNT NO.:  [id number]  MEDICAL RECORD NO.:  [id number]  LOCATION:  2925                         FACILITY:  [facility]  PHYSICIAN:  Hillis Range, MD       DATE OF BIRTH:  1932/03/31  DATE OF PROCEDURE: DATE OF DISCHARGE:                              OPERATIVE REPORT   SURGEON:  Hillis Range, MD  PREPROCEDURE DIAGNOSES: 1. Atrial flutter. 2. Paroxysmal atrial fibrillation.  POSTPROCEDURE DIAGNOSES: 1. Typical isthmus dependent right atrial flutter. 2. Paroxysmal atrial fibrillation. 3. Atypical atrial flutter.  PROCEDURES: 1. Comprehensive electrophysiologic study. 2. Coronary sinus pacing and recording. 3. Three-D mapping of supraventricular tachycardia. 4. Radiofrequency ablation of supraventricular tachycardia. 5. Arterial blood pressure monitoring. 6. Intracardiac echocardiography. 7. Transseptal puncture of an intact septum. 8. Pulmonary vein venography. 9. Isoproterenol infusion.  INTRODUCTION:  Bradley Short is a pleasant 77 year old gentleman with a history of paroxysmal atrial fibrillation and atrial flutter who now presents for EP study and radiofrequency ablation.  He was initially diagnosed with paroxysmal atrial fibrillation in 2000, at [facility].  He initially failed medical therapy with metoprolol, flecainide, and diltiazem.  Recently, he returns with typical appearing atrial flutter.  He therefore presents today for EP study and radiofrequency ablation.  DESCRIPTION OF PROCEDURE:  Informed written consent was obtained and the patient was brought to the electrophysiology lab in the fasting state. He was adequately sedated with intravenous medications as outlined in the anesthesia report.  The patient's right and left groins were prepped and draped in the usual sterile fashion by the EP lab staff.  Using a percutaneous Seldinger technique, two 7-French and one 8-French hemostasis sheaths were placed in the right  common femoral vein.  A 4- [place] hemostasis sheath was placed in the right common femoral artery for blood pressure monitoring.  An 11-French hemostasis sheath was placed into the left common femoral vein.  A 7-French Biosense Webster decapolar coronary sinus catheter was introduced through the right common femoral vein and advanced into the coronary sinus for recording and pacing from this location.  A 6-French quadripolar Josephson catheter was introduced through the right common femoral vein and advanced into the right ventricle for recording and pacing.  This catheter was then pulled back to the His bundle location.  The patient presented to the electrophysiology lab in typical appearing atrial flutter.  The atrial flutter cycle length was 250 milliseconds.  The coronary sinus activation revealed proximal to distal activation, was therefore suggestive of right atrial flutter.  Entrainment from the left atrium was performed which revealed a long post pacing interval. Entrainment from the cavotricuspid isthmus was therefore performed and revealed a post pacing interval approximately equal to the tachycardia cycle length.  The HV interval measured 55 milliseconds with a QRS duration of 82 milliseconds and a QT interval of 324 milliseconds.  I elected to perform cavotricuspid isthmus ablation.  A 3.5 mm Biosense EZ Steer ThermoCool ablation catheter was therefore introduced through the right common femoral vein and advanced into the right atrium.  Three- dimensional electroanatomical mapping was performed using CARTO technology.  This demonstrated a rather anteriorly displaced isthmus.  A series of radiofrequency applications were delivered  along the cavotricuspid isthmus using radiofrequency current.  The tachycardia slowed and then terminated during ablation.  Additional radiofrequency applications were delivered as bonus radiofrequency lesions along the isthmus.  Following ablation,  differential atrial pacing from the low lateral right atrium revealed a stimulus to earliest atrial activation recorded bidirectionally across the isthmus measuring 180 milliseconds. A 10-French Biosense Webster AcuNav intracardiac echocardiography catheter was introduced through the left common femoral vein and advanced into the right atrium.  Intracardiac echocardiography was performed which revealed a mildly enlarged left atrium.  The patient was noted to have four pulmonary veins with separate ostia.  There was no evidence of pulmonary vein stenosis.  The middle right common femoral vein sheath was exchanged for an 8.5 [place] SL-2 transseptal sheath and transseptal access was achieved in a standard fashion using a Brockenbrough needle under biplane fluoroscopy.  Once transseptal access had been achieved, heparin was administered intravenously and intra- arterially in order to maintain an ACT of greater than 300 seconds throughout the procedure.  A 6-French multipurpose angiographic catheter with guidewire was introduced through the transseptal sheath and positioned over the mouth of all four pulmonary veins.  Pulmonary venograms were performed by hand injection of nonionic contrast and demonstrated moderate to large pulmonary veins with no evidence of pulmonary vein stenosis.  The angiographic catheter was then removed. The His bundle catheter was removed and in its place the 3.5 mm Biosense Webster EZ [place] ThermoCool ablation catheter was re-advanced into the right atrium.  The transseptal sheath was pulled back into the IVC over a guidewire.  The ablation catheter was advanced across the transseptal hole using the wire as a guide.  The transseptal sheath was then re- advanced over the guidewire into the left atrium.  A 20-mm dual decapolar circular catheter was introduced through the transseptal sheath and positioned over the mouth of all four pulmonary veins.  Three- dimensional  electroanatomical mapping was performed using CARTO technology.  This demonstrated electrical conduction with all four pulmonary veins at baseline.  The patient underwent successful sequential electrical isolation and anatomical encircling of all four pulmonary veins using radiofrequency current with a circular mapping catheter as a guide.  Following ablation, isoproterenol was infused up to 20 mcg per minute with no inducible atrial fibrillation, atrial tachycardia, or arrhythmias initially.  Isoproterenol was then allowed to wash out.  During isoproterenol washout with catheter manipulation within the left atrium the patient transiently developed tachycardia. This is a one-to-one tachycardia with the earliest atrial activation recorded in the CS 7/8 electrode pair.  This was felt to possibly represent an atrial tachycardia with isoproterenol infusion and catheter manipulation.  Prior to being able to perform any diagnostic maneuvers, the tachycardia spontaneously terminated.  Continued to allow isoproterenol to washout.  Following ablation, atrial pacing was performed which revealed PR greater than RR but no tachycardias induced. The AV Wenckebach cycle length was 410 milliseconds.  Following ablation, the AH interval measured 220 milliseconds with an HV interval of 61 milliseconds.  Ventricular pacing was performed which revealed midline decremental VA conduction with a VA Wenckebach cycle length of 580 milliseconds.  Additional rapid atrial pacing was performed down to a cycle length of 210 milliseconds.  This induced atrial fibrillation initially.  Atrial fibrillation then appeared to organize into a rather regular tachycardia.  The tachycardia cycle length was 230 milliseconds and the earliest atrial activation was noticed in the CS 9-10 electrode pair.  This was a 2:1 atrial flutter.  Entrainment was performed from  the left atrium which revealed a very long post pacing  interval. Entrainment was attempted from the cavotricuspid isthmus, however, unfortunately the tachycardia terminated with this maneuver.  Additional rapid atrial pacing was performed with no further inducible arrhythmias. As this last atypical atrial flutter was earliest in the right atrium, I wanted to make sure that cavotricuspid isthmus block was complete.  I therefore placed a dual decapolar halo catheter around the tricuspid valve annulus and performed Differential atrial pacing from the low lateral right atrium.  This revealed clear cavotricuspid isthmus block bidirectionally.  The procedure was therefore considered completed.  All catheters were removed and the sheaths were aspirated and flushed.  The patient was transferred to the recovery area for sheath removal per protocol.  A limited bedside transthoracic echocardiogram revealed no pericardial effusion.  There were no early apparent complications.  CONCLUSIONS: 1. Typical isthmus dependent right atrial flutter upon presentation     successfully ablated along the cavotricuspid isthmus. 2. Complete cavotricuspid isthmus block achieved. 3. Successful pulmonary vein isolation of all four pulmonary veins     using radiofrequency current. 4. Atypical atrial flutter induced following ablation, however, this     rhythm terminated prematurely and could not be re-induced or     mapped. 5. No early apparent complications.     Hillis Range, MD     JA/MEDQ  D:  09/27/2010  T:  09/28/2010  Job:  [id number]  cc:   Rollene Rotunda, MD, [facility]  Electronically Signed by Hillis Range MD on 10/20/2010 09:43:11 AM

## 2010-10-20 NOTE — Discharge Summary (Signed)
Bradley Short, Bradley Short              ACCOUNT NO.:  [id number]  MEDICAL RECORD NO.:  [id number]  LOCATION:  2925                         FACILITY:  [facility]  PHYSICIAN:  Hillis Range, MD       DATE OF BIRTH:  April 10, 1932  DATE OF ADMISSION:  09/27/2010 DATE OF DISCHARGE:  09/28/2010                              DISCHARGE SUMMARY   PRIMARY CARE PHYSICIAN:  Dr. Princella Pellegrini.  PRIMARY CARDIOLOGIST:  Rollene Rotunda, MD, [facility]  ELECTROPHYSIOLOGIST:  Hillis Range, MD  PRIMARY DIAGNOSIS:  Atrial fibrillation and atrial flutter.  SECONDARY DIAGNOSES: 1. Hypertension. 2. Hyperlipidemia. 3. Obstructive sleep apnea, on continuous positive airway pressure. 4. Chronic anticoagulation with warfarin.  ALLERGIES:  The patient is allergic to PROPAFENONE, DILTIAZEM, and LISINOPRIL.  PROCEDURES THIS ADMISSION: 1. Transesophageal echocardiogram by Dr. Elease Hashimoto on September 27, 2010.     This demonstrated normal left ventricular function with no thrombus     in the left atrial appendage.  The patient does have a patent     foramen ovale with bubble contrast. 2. Electrophysiology study and radiofrequency catheter ablation of     atrial fibrillation and atrial flutter on September 27, 2010, by Dr.     Johney Frame.  This demonstrated typical isthmus-dependent right atrial     flutter upon presentation that was successfully ablated along with     cavotricuspid isthmus.  The patient also underwent successful     pulmonary vein isolation of all four pulmonary veins using     radiofrequency current.  The patient did have atypical atrial     flutter induced following ablation, however, this rhythm terminated     prematurely and could not be reinduced or mapped.  The patient had     no early apparent complications.  BRIEF HISTORY OF PRESENT ILLNESS:  Bradley Short is a 77 year old male who was initially diagnosed with atrial fibrillation in 2000 after developing diaphoresis and palpitations.  He was evaluated at that  time and underwent catheterization which revealed no significant coronary artery disease.  He was placed on metoprolol but was unable to tolerate this due to somnolence and fatigue.  He did well until around 2007, at which time he returned to atrial fibrillation.  At that time, he was placed on flecainide and diltiazem; however, he developed a severe skin reaction and those medications were discontinued.  He subsequently did well off antiarrhythmic drugs and until presenting in March 2012, at which time he was found to be in atrial flutter.  He is symptomatic with his atrial arrhythmias with tachy palpitations, diaphoresis, and decreased exercise tolerance.  He was referred to Dr. Johney Frame in the outpatient setting for treatment options.  Risks, benefits, and alternatives of ablation were reviewed with the patient, he wished to proceed.  HOSPITAL COURSE:  The patient was admitted on September 27, 2010, with planned TEE and ablation of atrial fibrillation and atrial flutter. This was carried out by Dr. Elease Hashimoto and Dr. Johney Frame with details outlined above.  He was monitored on telemetry overnight which demonstrated sinus rhythm.  EKG was obtained which demonstrated sinus rhythm with first- degree AV block with a rate of 73 beats per minute.  Intervals of 0.21/0.09/0.42.  The patient's groin incisions were without hematoma or bruit.  Dr. Johney Frame examined the patient and considered him stable for discharge.  FOLLOWUP APPOINTMENTS: 1. Dr. Johney Frame in the [facility] on December 28, 2010, at     1:45 p.m. 2. Dr. Antoine Poche as scheduled. 3. Dr. Neita Carp as scheduled. 4. Dr. Dian Situ office for a Coumadin check in 1 week - the patient     will call and schedule this appointment.  DISCHARGE INSTRUCTIONS: 1. Increase activity slowly. 2. No driving for 4 days. 3. Follow low-sodium heart healthy diet. 4. Keep incisions clean and dry.  DISCHARGE MEDICATIONS: 1. Protonix 40 mg 1 tablet daily -  this is a new prescription for the     patient. 2. Aleve 220 mg one tablet 3 times daily as needed for pain. 3. Fish oil twice daily. 4. Metoprolol 50 mg 1 tablet every 12 hours as needed for atrial     fibrillation. 5. Multivitamin daily. 6. Pravastatin 20 mg daily. 7. Warfarin 5 mg, take 1 tablet daily except for every third day take     one half tablet.  DISPOSITION:  Home.  The patient was seen and examined by Dr. Johney Frame on September 28, 2010, and considered stable for discharge.  DISCHARGE ENCOUNTER:  35 minutes.     Gypsy Balsam, RN,BSN   ______________________________ Hillis Range, MD    AS/MEDQ  D:  09/28/2010  T:  09/28/2010  Job:  [id number]  cc:   Rollene Rotunda, MD, [facility] Dr. Princella Pellegrini  Electronically Signed by Gypsy Balsam RNBSN on 10/14/2010 10:21:25 AM Electronically Signed by Hillis Range MD on 10/20/2010 09:43:15 AM

## 2010-10-24 NOTE — H&P (Signed)
Bradley Short, Bradley Short NO.:  [id number]  MEDICAL RECORD NO.:  [id number]  LOCATION:  2036                         FACILITY:  [facility]  PHYSICIAN:  Duke Salvia, MD, FACCDATE OF BIRTH:  12-20-1932  DATE OF ADMISSION:  10/11/2010 DATE OF DISCHARGE:                             HISTORY & PHYSICAL   PRIMARY CARE PHYSICIAN:  Dr. Neita Carp in [place], [place].  PRIMARY CARDIOLOGIST:  Rollene Rotunda, MD, [facility]  ELECTROPHYSIOLOGIST:  Dr. Johney Frame.  CHIEF COMPLAINT:  Palpitations/atrial fibrillation.  HISTORY OF PRESENT ILLNESS:  [name] is a 77 year old male with a history of atrial fib ablation on September 27, 2010.  He was doing well until yesterday.  Yesterday Mr. Bradley Short had general malaise all day.  He had possibly some slight dyspnea on exertion.  He had no pain or specific complaints.  He had no palpitations.  The general malaise is his atrial fibrillation symptom so he checked his blood pressure and heart rate and his blood pressure was normal but his heart rate was elevated, up to 120 at times. He checked his pulse and could tell it was irregular.  Today he had a regularly scheduled appointment with Dr. Neita Carp and in Dr. Dian Situ office when he expressed his concern, an EKG was checked and showed AFib RPR.  Additionally, his blood pressure was low.  Dr. Neita Carp send him by EMS to [facility].  At [facility] in the emergency room, his blood pressure has improved and his heart rate is in the 100s-120s.  He still feels the general malaise but otherwise is asymptomatic.  PAST MEDICAL HISTORY: 1. Typical isthmus-dependent right atrial flutter (successfully     ablated on September 27, 2010).  Atypical atrial flutter following     ablation that terminated prematurely and could not be reinduced or     mapped.  Atrial fibrillation with successful pulmonary vein     isolation of all 4 pulmonary veins and complete cavotricuspid     isthmus block achieved on September 27, 2010. 2. Hypertension. 3. Hyperlipidemia. 4. Obstructive sleep apnea, on CPAP. 5. Chronic anticoagulation with Coumadin. 6. History of AVNRT.  SURGICAL HISTORY:  He is status post cardiac catheterization in 2000 showing nonobstructive coronary artery disease as well as melanoma resection from his right shoulder and the electrophysiology study with ablation procedure on September 27, 2010.  ALLERGIES:  He is allergic or intolerant to lisinopril, Lovenox and Cardizem.  There is a discrepancy as to whether he is allergic to propafenone or flecainide or both, so we will list both.  One list also includes Toprol-XL, but this is believed to be an intolerance with fatigue.  CURRENT MEDICATIONS: 1. Coumadin 5 mg daily except for every third day one-half tablet 2. Protonix 40 mg a day. 3. Aleve p.r.n. 4. Fish oil t.i.d. 5. Lopressor 50 mg b.i.d. p.r.n. for AFib. 6. Multivitamin daily. 7. Pravachol 20 mg a day.  SOCIAL HISTORY:  Lives in [place] with his wife.  He is a retired Chartered certified accountant.  He has no history of alcohol, tobacco or drug abuse.  FAMILY HISTORY:  Both of his parents are deceased and his father had prostate cancer.  Neither of his  parents had cardiac issues.  REVIEW OF SYSTEMS:  He has some questionable dyspnea on exertion versus shortness of breath, but for several months has occasionally felt the need to stop and take deep breath which he does several times and feels better.  He has not had any fevers or chills.  He has occasional arthralgias.  He has not had melena.  He denies any dysuria.  Full 14- point review of systems is otherwise negative except as stated in the HPI.  PHYSICAL EXAMINATION:  VITAL SIGNS:  Temperature is 98.3, blood pressure 126/85, heart rate 106, respiratory rate 20, O2 saturation 99% on 2 L. GENERAL:  He is a well-developed, well-nourished white male in no acute distress. HEENT:  Normal. NECK:  There is no lymphadenopathy, thyromegaly, bruit or JVD  noted. CV:  His heart is irregular in rate and rhythm with an S1, S2 and no significant murmur, rub or gallop is noted.  Distal pulses are intact in all 4 extremities. LUNGS:  Clear to auscultation bilaterally. SKIN:  No rashes or lesions are noted and all IV sites have healed well without any signs of infection. ABDOMEN:  Soft and nontender with active bowel sounds. EXTREMITIES:  There is no cyanosis, clubbing or edema noted. MUSCULOSKELETAL:  There is no joint deformity or effusions and no spine or CVA tenderness. NEURO:  He is alert and oriented.  Cranial nerves II-XII grossly intact.  Chest x-ray, no acute disease.  LABORATORY VALUES:  Hemoglobin 14.7, hematocrit 42.4, WBCs 17.3, platelets 221.  Sodium 137, potassium 4.1, chloride 105, CO2 23, BUN 15, creatinine 1.11, glucose 94, INR 2.02, PTT 42, troponin-I 0.01.  IMPRESSION: 1. Mr. Bradley Short was seen today by Dr. Graciela Husbands, the patient evaluated and     the data reviewed.  He is post atrial flutter and atrial     fibrillation ablation. 2. Previous intolerance to possibly flecainide and possibly     propafenone. 3. Prolonged QTs. 4. Leukocytosis. 5. Obstructive sleep apnea, on CPAP.  PLAN: 1. Admit to telemetry. 2. Add amiodarone at 400 mg p.o. q.8 h. 3. Coumadin per pharmacy to make sure that his INR does not decrease     further. 4. Direct current cardioversion in a.m. if he is still on AFib. 5. Blood cultures and urine cultures if he has an elevated temperature     which he currently does not but we will check urinalysis. 6. Recheck CBC in a.m.     Theodore Demark, PA-C   ______________________________ Duke Salvia, MD, [facility]    RB/MEDQ  D:  10/11/2010  T:  10/11/2010  Job:  [id number]  Electronically Signed by Theodore Demark PA-C on 10/13/2010 07:56:16 PM Electronically Signed by Sherryl Manges MD [facility] on 10/24/2010 01:54:10 PM

## 2010-12-28 ENCOUNTER — Ambulatory Visit (INDEPENDENT_AMBULATORY_CARE_PROVIDER_SITE_OTHER): Payer: Medicare Other | Admitting: Internal Medicine

## 2010-12-28 ENCOUNTER — Encounter: Payer: Self-pay | Admitting: Internal Medicine

## 2010-12-28 VITALS — BP 148/72 | HR 62 | Ht 70.0 in | Wt 204.0 lb

## 2010-12-28 DIAGNOSIS — I4891 Unspecified atrial fibrillation: Secondary | ICD-10-CM

## 2010-12-28 NOTE — Assessment & Plan Note (Signed)
Doing well s/p PVI Decrease amiodarone to 100mg  daily IF remains in sinus upon return in 3 months, we will stop amiodarone  Continue coumadin for now (CHADS2 score is at least 1)

## 2010-12-28 NOTE — Patient Instructions (Signed)
Your physician recommends that you schedule a follow-up appointment in: 3 months with Dr Allred  Your physician has recommended you make the following change in your medication:  1) Decrease Amiodarone to 100mg daily   

## 2010-12-28 NOTE — Progress Notes (Signed)
The patient presents today for routine electrophysiology followup.  Since last being seen in our clinic, the patient reports doing very well. He has ERAF early but has been in sinus rhythm since late August.  He denies procedure related complications and is pleased with he results thus far.  Today, he denies symptoms of palpitations, chest pain, shortness of breath, orthopnea, PND, lower extremity edema, dizziness, presyncope, syncope, or neurologic sequela.  The patient feels that he is tolerating medications without difficulties and is otherwise without complaint today.   Past Medical History  Diagnosis Date  . Atrial flutter     s/p CTI ablation  . Hypertension   . Hyperlipidemia   . Obstructive sleep apnea (adult) (pediatric)     on CPAP  . Atrial fibrillation     paroxysmal, s/p PVI by Dr Johney Frame 8/12   Past Surgical History  Procedure Date  . Cardiac catheterization     nonobstructive CAD 2000  . Melanoma (other)     resected from his right shoulder  . Atrial ablation surgery     s/p CTI and PVI ablations by Dr Johney Frame 8/12    Current Outpatient Prescriptions  Medication Sig Dispense Refill  . amiodarone (PACERONE) 200 MG tablet Take 200 mg by mouth daily. Half a tablet twice a day       . clonazePAM (KLONOPIN) 0.5 MG tablet       . Multiple Vitamins-Minerals (MULTIVITAL) tablet Take 1 tablet by mouth daily.        . naproxen sodium (ALEVE) 220 MG tablet Take 220 mg by mouth. As needed gout       . Omega-3 Fatty Acids (FISH OIL) 1000 MG CAPS Take by mouth daily.        . pravastatin (PRAVACHOL) 20 MG tablet Take 20 mg by mouth daily.        Marland Kitchen warfarin (COUMADIN) 5 MG tablet Take 5 mg by mouth. Use as directed by Dr. Neita Carp.         Allergies  Allergen Reactions  . Diltiazem Hcl     REACTION: Looked like burned all over  . Lisinopril     REACTION: Looked like burned all over  . Metoprolol Succinate     REACTION: Looked like burned all over  . Propafenone Hcl    REACTION: Looked like burned all over    History   Social History  . Marital Status: Married    Spouse Name: N/A    Number of Children: N/A  . Years of Education: N/A   Occupational History  . Not on file.   Social History Main Topics  . Smoking status: Never Smoker   . Smokeless tobacco: Never Used  . Alcohol Use: No  . Drug Use: No  . Sexually Active: Not on file   Other Topics Concern  . 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 Prostate cancer Father   . Seizures Other   . Cancer Other   . Diabetes Other   . Heart disease Other     Physical Exam: Filed Vitals:   12/28/10 1333  BP: 148/72  Pulse: 62  Height: 5\' 10"  (1.778 m)  Weight: 204 lb (92.534 kg)    GEN- The patient is well appearing, alert and oriented x 3 today.   Head- normocephalic, atraumatic Eyes-  Sclera clear, conjunctiva pink Ears- hearing intact  Oropharynx- clear Neck- supple, no JVP Lymph- no cervical lymphadenopathy Lungs- Clear to ausculation bilaterally, normal work of breathing Heart- Regular rate and rhythm, no murmurs, rubs or gallops, PMI not laterally displaced GI- soft, NT, ND, + BS Extremities- no clubbing, cyanosis, or edema  ekg today reveals sinus rhythm 62 bpm, PR 192, QRS 90, Qtc 440   Assessment and Plan:

## 2011-01-04 ENCOUNTER — Other Ambulatory Visit: Payer: Medicare Other | Admitting: *Deleted

## 2011-01-25 ENCOUNTER — Encounter: Payer: Self-pay | Admitting: Internal Medicine

## 2011-03-28 DIAGNOSIS — M109 Gout, unspecified: Secondary | ICD-10-CM | POA: Diagnosis not present

## 2011-03-28 DIAGNOSIS — I4891 Unspecified atrial fibrillation: Secondary | ICD-10-CM | POA: Diagnosis not present

## 2011-03-28 DIAGNOSIS — F411 Generalized anxiety disorder: Secondary | ICD-10-CM | POA: Diagnosis not present

## 2011-03-28 DIAGNOSIS — E7800 Pure hypercholesterolemia, unspecified: Secondary | ICD-10-CM | POA: Diagnosis not present

## 2011-03-29 ENCOUNTER — Ambulatory Visit: Payer: Medicare Other | Admitting: Internal Medicine

## 2011-03-30 ENCOUNTER — Encounter: Payer: Self-pay | Admitting: Internal Medicine

## 2011-03-30 ENCOUNTER — Ambulatory Visit (INDEPENDENT_AMBULATORY_CARE_PROVIDER_SITE_OTHER): Payer: Medicare Other | Admitting: Internal Medicine

## 2011-03-30 VITALS — BP 142/80 | HR 67 | Ht 70.0 in | Wt 202.0 lb

## 2011-03-30 DIAGNOSIS — I10 Essential (primary) hypertension: Secondary | ICD-10-CM | POA: Diagnosis not present

## 2011-03-30 DIAGNOSIS — I4891 Unspecified atrial fibrillation: Secondary | ICD-10-CM | POA: Diagnosis not present

## 2011-03-30 NOTE — Assessment & Plan Note (Signed)
Doing well s/p PVI Decrease amiodarone to 100mg  QOD (he is afraid to quit amodarone at this time) IF remains in sinus upon return in 3 months, we will stop amiodarone  Continue coumadin for now (CHADS2 score is at least 1)

## 2011-03-30 NOTE — Progress Notes (Signed)
The patient presents today for routine electrophysiology followup.  Since last being seen in our clinic, the patient reports doing very well.  He denies any afib since last visit.  Today, he denies symptoms of palpitations, chest pain, shortness of breath, orthopnea, PND, lower extremity edema, dizziness, presyncope, syncope, or neurologic sequela.  The patient feels that he is tolerating medications without difficulties and is otherwise without complaint today.   Past Medical History  Diagnosis Date  . Atrial flutter     s/p CTI ablation  . Hypertension   . Hyperlipidemia   . Obstructive sleep apnea (adult) (pediatric)     on CPAP  . Atrial fibrillation     paroxysmal, s/p PVI by Dr Johney Frame 8/12   Past Surgical History  Procedure Date  . Cardiac catheterization     nonobstructive CAD 2000  . Melanoma (other)     resected from his right shoulder  . Atrial ablation surgery     s/p CTI and PVI ablations by Dr Johney Frame 8/12    Current Outpatient Prescriptions  Medication Sig Dispense Refill  . amiodarone (PACERONE) 200 MG tablet Half a tablet every other day      . Multiple Vitamins-Minerals (MULTIVITAL) tablet Take 1 tablet by mouth daily.        . naproxen sodium (ALEVE) 220 MG tablet Take 220 mg by mouth. As needed gout       . Omega-3 Fatty Acids (FISH OIL) 1000 MG CAPS Take by mouth daily.        . pravastatin (PRAVACHOL) 20 MG tablet Take 20 mg by mouth daily.        Marland Kitchen warfarin (COUMADIN) 5 MG tablet Take 5 mg by mouth. Use as directed by Dr. Neita Carp.         Allergies  Allergen Reactions  . Diltiazem Hcl     REACTION: Looked like burned all over  . Lisinopril     REACTION: Looked like burned all over  . Metoprolol Succinate     REACTION: Looked like burned all over  . Propafenone Hcl     REACTION: Looked like burned all over    History   Social History  . Marital Status: Married    Spouse Name: N/A    Number of Children: N/A  . Years of Education: N/A    Occupational History  . Not on file.   Social History Main Topics  . Smoking status: Never Smoker   . Smokeless tobacco: Never Used  . Alcohol Use: No  . Drug Use: No  . Sexually Active: Not on file   Other Topics Concern  . 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 Prostate cancer Father   . Seizures Other   . Cancer Other   . Diabetes Other   . Heart disease Other     Physical Exam: Filed Vitals:   03/30/11 1143  BP: 142/80  Pulse: 67  Height: 5\' 10"  (1.778 m)  Weight: 202 lb (91.627 kg)    GEN- The patient is well appearing, alert and oriented x 3 today.   Head- normocephalic, atraumatic Eyes-  Sclera clear, conjunctiva pink Ears- hearing intact Oropharynx- clear Neck- supple, no JVP Lymph- no cervical lymphadenopathy Lungs- Clear to ausculation bilaterally, normal work of breathing Heart- Regular rate and rhythm, no murmurs, rubs or gallops, PMI not laterally displaced GI- soft, NT,  ND, + BS Extremities- no clubbing, cyanosis, or edema  ekg today reveals sinus rhythm 67 bpm, PR 182, QRS 90, Qtc 450   Assessment and Plan:

## 2011-03-30 NOTE — Assessment & Plan Note (Signed)
Stable No change required today  

## 2011-03-30 NOTE — Patient Instructions (Addendum)
Your physician recommends that you schedule a follow-up appointment in: 3 months with Dr Johney Frame  Your physician has recommended you make the following change in your medication:  1. Decrease Amiodarone 100mg  every other day

## 2011-03-31 DIAGNOSIS — H43399 Other vitreous opacities, unspecified eye: Secondary | ICD-10-CM | POA: Diagnosis not present

## 2011-04-27 DIAGNOSIS — I4891 Unspecified atrial fibrillation: Secondary | ICD-10-CM | POA: Diagnosis not present

## 2011-05-25 DIAGNOSIS — I4891 Unspecified atrial fibrillation: Secondary | ICD-10-CM | POA: Diagnosis not present

## 2011-06-21 ENCOUNTER — Ambulatory Visit (INDEPENDENT_AMBULATORY_CARE_PROVIDER_SITE_OTHER): Payer: Medicare Other | Admitting: Internal Medicine

## 2011-06-21 ENCOUNTER — Encounter: Payer: Self-pay | Admitting: Internal Medicine

## 2011-06-21 VITALS — BP 136/74 | HR 49 | Resp 18 | Ht 70.0 in | Wt 199.0 lb

## 2011-06-21 DIAGNOSIS — I4891 Unspecified atrial fibrillation: Secondary | ICD-10-CM

## 2011-06-21 NOTE — Patient Instructions (Signed)
Your physician wants you to follow-up in: 6 months with Dr Allred You will receive a reminder letter in the mail two months in advance. If you don't receive a letter, please call our office to schedule the follow-up appointment.    Your physician has recommended you make the following change in your medication:  1) Stop Amiodarone   

## 2011-06-21 NOTE — Assessment & Plan Note (Signed)
Doing very well s/p ablation without recurrence Stop amiodarone  CHADS2 score is 1(he denies h/oHTN but is 65) He would like to consider stopping coumadin if no further afib upon return

## 2011-06-21 NOTE — Progress Notes (Signed)
The patient presents today for routine electrophysiology followup.  Since last being seen in our clinic, the patient reports doing very well.  He denies any afib since last visit.  Today, he denies symptoms of palpitations, chest pain, shortness of breath,  dizziness, presyncope, syncope, or neurologic sequela.  The patient feels that he is tolerating medications without difficulties and is otherwise without complaint today.   Past Medical History  Diagnosis Date  . Atrial flutter     s/p CTI ablation  . Hypertension   . Hyperlipidemia   . Obstructive sleep apnea (adult) (pediatric)     on CPAP  . Atrial fibrillation     paroxysmal, s/p PVI by Dr Johney Frame 8/12   Past Surgical History  Procedure Date  . Cardiac catheterization     nonobstructive CAD 2000  . Melanoma (other)     resected from his right shoulder  . Atrial ablation surgery     s/p CTI and PVI ablations by Dr Johney Frame 8/12    Current Outpatient Prescriptions  Medication Sig Dispense Refill  . Multiple Vitamins-Minerals (MULTIVITAL) tablet Take 1 tablet by mouth daily.        . naproxen sodium (ALEVE) 220 MG tablet Take 220 mg by mouth. As needed gout       . Omega-3 Fatty Acids (FISH OIL) 1000 MG CAPS Take by mouth daily.        . pravastatin (PRAVACHOL) 20 MG tablet Take 20 mg by mouth daily.        Marland Kitchen warfarin (COUMADIN) 5 MG tablet Take 5 mg by mouth. Use as directed by Dr. Neita Carp.         Allergies  Allergen Reactions  . Diltiazem Hcl     REACTION: Looked like burned all over  . Lisinopril     REACTION: Looked like burned all over  . Metoprolol Succinate     REACTION: Looked like burned all over  . Propafenone Hcl     REACTION: Looked like burned all over    History   Social History  . Marital Status: Married    Spouse Name: N/A    Number of Children: N/A  . Years of Education: N/A   Occupational History  . Not on file.   Social History Main Topics  . Smoking status: Never Smoker   . Smokeless  tobacco: Never Used  . Alcohol Use: No  . Drug Use: No  . Sexually Active: Not on file   Other Topics Concern  . 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 Prostate cancer Father   . Seizures Other   . Cancer Other   . Diabetes Other   . Heart disease Other     Physical Exam: Filed Vitals:   06/21/11 0953  BP: 136/74  Pulse: 49  Resp: 18  Height: 5\' 10"  (1.778 m)  Weight: 199 lb (90.266 kg)    GEN- The patient is well appearing, alert and oriented x 3 today.   Head- normocephalic, atraumatic Eyes-  Sclera clear, conjunctiva pink Ears- hearing intact Oropharynx- clear Neck- supple, no JVP Lymph- no cervical lymphadenopathy Lungs- Clear to ausculation bilaterally, normal work of breathing Heart- Regular rate and rhythm, no murmurs, rubs or gallops, PMI not laterally displaced GI- soft, NT, ND, + BS Extremities- no clubbing, cyanosis, or edema  ekg today reveals sinus rhythm 50 bpm, PR  206, QRS 90, Qtc 415   Assessment and Plan:

## 2011-06-27 DIAGNOSIS — I4891 Unspecified atrial fibrillation: Secondary | ICD-10-CM | POA: Diagnosis not present

## 2011-07-12 DIAGNOSIS — E7800 Pure hypercholesterolemia, unspecified: Secondary | ICD-10-CM | POA: Diagnosis not present

## 2011-07-19 DIAGNOSIS — F411 Generalized anxiety disorder: Secondary | ICD-10-CM | POA: Diagnosis not present

## 2011-07-19 DIAGNOSIS — I4891 Unspecified atrial fibrillation: Secondary | ICD-10-CM | POA: Diagnosis not present

## 2011-07-19 DIAGNOSIS — E7800 Pure hypercholesterolemia, unspecified: Secondary | ICD-10-CM | POA: Diagnosis not present

## 2011-07-19 DIAGNOSIS — M109 Gout, unspecified: Secondary | ICD-10-CM | POA: Diagnosis not present

## 2011-07-27 DIAGNOSIS — I4891 Unspecified atrial fibrillation: Secondary | ICD-10-CM | POA: Diagnosis not present

## 2011-08-25 DIAGNOSIS — I4891 Unspecified atrial fibrillation: Secondary | ICD-10-CM | POA: Diagnosis not present

## 2011-09-05 DIAGNOSIS — I4891 Unspecified atrial fibrillation: Secondary | ICD-10-CM | POA: Diagnosis not present

## 2011-10-10 DIAGNOSIS — I4891 Unspecified atrial fibrillation: Secondary | ICD-10-CM | POA: Diagnosis not present

## 2011-11-14 DIAGNOSIS — I4891 Unspecified atrial fibrillation: Secondary | ICD-10-CM | POA: Diagnosis not present

## 2011-11-14 DIAGNOSIS — E7800 Pure hypercholesterolemia, unspecified: Secondary | ICD-10-CM | POA: Diagnosis not present

## 2011-11-14 DIAGNOSIS — M109 Gout, unspecified: Secondary | ICD-10-CM | POA: Diagnosis not present

## 2011-11-29 DIAGNOSIS — M109 Gout, unspecified: Secondary | ICD-10-CM | POA: Diagnosis not present

## 2011-11-29 DIAGNOSIS — E7800 Pure hypercholesterolemia, unspecified: Secondary | ICD-10-CM | POA: Diagnosis not present

## 2011-11-29 DIAGNOSIS — I4891 Unspecified atrial fibrillation: Secondary | ICD-10-CM | POA: Diagnosis not present

## 2011-12-18 ENCOUNTER — Encounter: Payer: Self-pay | Admitting: Internal Medicine

## 2011-12-18 ENCOUNTER — Ambulatory Visit (INDEPENDENT_AMBULATORY_CARE_PROVIDER_SITE_OTHER): Payer: Medicare Other | Admitting: Internal Medicine

## 2011-12-18 VITALS — BP 138/60 | HR 65 | Ht 71.0 in | Wt 193.0 lb

## 2011-12-18 DIAGNOSIS — I4891 Unspecified atrial fibrillation: Secondary | ICD-10-CM | POA: Diagnosis not present

## 2011-12-18 NOTE — Patient Instructions (Signed)
Your physician wants you to follow-up in: 12 months with Dr Jacquiline Doe will receive a reminder letter in the mail two months in advance. If you don't receive a letter, please call our office to schedule the follow-up appointment.  Name of medication to consider is Eliquis

## 2011-12-18 NOTE — Assessment & Plan Note (Signed)
Doing very well s/p ablation without recurrence off of AAD   CHADS2 score is 1(he denies h/oHTN but is 52) We discussed pradaxa, xarelto, eliquis, coumadin, and ASA at length today. He might lean towards eliquis given the AVERROES study which compared eliquis more favorably with ASA.  He will check on costs of eliquis and then make a decision about switching. In the interim, he will continue coumadin.  I will see him again in 1year

## 2011-12-18 NOTE — Progress Notes (Signed)
PCP: Estanislado Pandy, MD Primary Cardiologist:  Dr Antoine Poche  The patient presents today for routine electrophysiology followup.  Since last being seen in our clinic, the patient reports doing very well.  He denies any afib since last visit.  Today, he denies symptoms of palpitations, chest pain, shortness of breath,  dizziness, presyncope, syncope, or neurologic sequela.  The patient feels that he is tolerating medications without difficulties and is otherwise without complaint today.   Past Medical History  Diagnosis Date  . Atrial flutter     s/p CTI ablation  . Hypertension   . Hyperlipidemia   . Obstructive sleep apnea (adult) (pediatric)     on CPAP  . Atrial fibrillation     paroxysmal, s/p PVI by Dr Johney Frame 8/12   Past Surgical History  Procedure Date  . Cardiac catheterization     nonobstructive CAD 2000  . Melanoma (other)     resected from his right shoulder  . Atrial ablation surgery     s/p CTI and PVI ablations by Dr Johney Frame 8/12    Current Outpatient Prescriptions  Medication Sig Dispense Refill  . Multiple Vitamins-Minerals (MULTIVITAL) tablet Take 1 tablet by mouth daily.        . naproxen sodium (ALEVE) 220 MG tablet Take 220 mg by mouth. As needed gout       . Omega-3 Fatty Acids (FISH OIL) 1000 MG CAPS Take by mouth daily.        . pravastatin (PRAVACHOL) 20 MG tablet Take 20 mg by mouth daily.        Marland Kitchen warfarin (COUMADIN) 5 MG tablet Take 5 mg by mouth. Use as directed by Dr. Neita Carp.         Allergies  Allergen Reactions  . Diltiazem Hcl     REACTION: Looked like burned all over  . Lisinopril     REACTION: Looked like burned all over  . Metoprolol Succinate     REACTION: Looked like burned all over  . Propafenone Hcl     REACTION: Looked like burned all over    History   Social History  . Marital Status: Married    Spouse Name: N/A    Number of Children: N/A  . Years of Education: N/A   Occupational History  . Not on file.   Social History  Main Topics  . Smoking status: Never Smoker   . Smokeless tobacco: Never Used  . Alcohol Use: No  . Drug Use: No  . Sexually Active: Not on file   Other Topics Concern  . 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 Prostate cancer Father   . Seizures Other   . Cancer Other   . Diabetes Other   . Heart disease Other     Physical Exam: Filed Vitals:   12/18/11 0955  BP: 138/60  Pulse: 65  Height: 5\' 11"  (1.803 m)  Weight: 193 lb (87.544 kg)  SpO2: 98%    GEN- The patient is well appearing, alert and oriented x 3 today.   Head- normocephalic, atraumatic Eyes-  Sclera clear, conjunctiva pink Ears- hearing intact Oropharynx- clear Neck- supple, no JVP Lymph- no cervical lymphadenopathy Lungs- Clear to ausculation bilaterally, normal work of breathing Heart- Regular rate and rhythm, no murmurs, rubs or gallops, PMI not laterally displaced GI- soft, NT, ND, + BS Extremities- no clubbing, cyanosis, or  edema  ekg today reveals sinus rhythm 65 bpm, normal ekg   Assessment and Plan:

## 2011-12-19 DIAGNOSIS — I4891 Unspecified atrial fibrillation: Secondary | ICD-10-CM | POA: Diagnosis not present

## 2011-12-20 DIAGNOSIS — Z23 Encounter for immunization: Secondary | ICD-10-CM | POA: Diagnosis not present

## 2012-01-18 DIAGNOSIS — I4891 Unspecified atrial fibrillation: Secondary | ICD-10-CM | POA: Diagnosis not present

## 2012-01-18 DIAGNOSIS — Z23 Encounter for immunization: Secondary | ICD-10-CM | POA: Diagnosis not present

## 2012-02-16 DIAGNOSIS — I4891 Unspecified atrial fibrillation: Secondary | ICD-10-CM | POA: Diagnosis not present

## 2012-03-15 DIAGNOSIS — I4891 Unspecified atrial fibrillation: Secondary | ICD-10-CM | POA: Diagnosis not present

## 2012-04-03 DIAGNOSIS — E7800 Pure hypercholesterolemia, unspecified: Secondary | ICD-10-CM | POA: Diagnosis not present

## 2012-04-09 DIAGNOSIS — N189 Chronic kidney disease, unspecified: Secondary | ICD-10-CM | POA: Diagnosis not present

## 2012-04-09 DIAGNOSIS — E7800 Pure hypercholesterolemia, unspecified: Secondary | ICD-10-CM | POA: Diagnosis not present

## 2012-04-09 DIAGNOSIS — M109 Gout, unspecified: Secondary | ICD-10-CM | POA: Diagnosis not present

## 2012-04-09 DIAGNOSIS — I4891 Unspecified atrial fibrillation: Secondary | ICD-10-CM | POA: Diagnosis not present

## 2012-04-16 DIAGNOSIS — I4891 Unspecified atrial fibrillation: Secondary | ICD-10-CM | POA: Diagnosis not present

## 2012-05-14 DIAGNOSIS — I4891 Unspecified atrial fibrillation: Secondary | ICD-10-CM | POA: Diagnosis not present

## 2012-06-18 DIAGNOSIS — I4891 Unspecified atrial fibrillation: Secondary | ICD-10-CM | POA: Diagnosis not present

## 2012-07-04 DIAGNOSIS — I4891 Unspecified atrial fibrillation: Secondary | ICD-10-CM | POA: Diagnosis not present

## 2012-08-05 DIAGNOSIS — J40 Bronchitis, not specified as acute or chronic: Secondary | ICD-10-CM | POA: Diagnosis not present

## 2012-08-05 DIAGNOSIS — I4891 Unspecified atrial fibrillation: Secondary | ICD-10-CM | POA: Diagnosis not present

## 2012-08-08 DIAGNOSIS — E7800 Pure hypercholesterolemia, unspecified: Secondary | ICD-10-CM | POA: Diagnosis not present

## 2012-08-09 DIAGNOSIS — E7800 Pure hypercholesterolemia, unspecified: Secondary | ICD-10-CM | POA: Diagnosis not present

## 2012-08-09 DIAGNOSIS — M109 Gout, unspecified: Secondary | ICD-10-CM | POA: Diagnosis not present

## 2012-08-09 DIAGNOSIS — J40 Bronchitis, not specified as acute or chronic: Secondary | ICD-10-CM | POA: Diagnosis not present

## 2012-08-09 DIAGNOSIS — F411 Generalized anxiety disorder: Secondary | ICD-10-CM | POA: Diagnosis not present

## 2012-08-09 DIAGNOSIS — I4891 Unspecified atrial fibrillation: Secondary | ICD-10-CM | POA: Diagnosis not present

## 2012-08-09 DIAGNOSIS — N189 Chronic kidney disease, unspecified: Secondary | ICD-10-CM | POA: Diagnosis not present

## 2012-08-12 DIAGNOSIS — I4891 Unspecified atrial fibrillation: Secondary | ICD-10-CM | POA: Diagnosis not present

## 2012-08-19 DIAGNOSIS — I4891 Unspecified atrial fibrillation: Secondary | ICD-10-CM | POA: Diagnosis not present

## 2012-09-03 DIAGNOSIS — L570 Actinic keratosis: Secondary | ICD-10-CM | POA: Diagnosis not present

## 2012-09-03 DIAGNOSIS — D485 Neoplasm of uncertain behavior of skin: Secondary | ICD-10-CM | POA: Diagnosis not present

## 2012-09-03 DIAGNOSIS — Z85828 Personal history of other malignant neoplasm of skin: Secondary | ICD-10-CM | POA: Diagnosis not present

## 2012-09-19 DIAGNOSIS — I4891 Unspecified atrial fibrillation: Secondary | ICD-10-CM | POA: Diagnosis not present

## 2012-10-10 DIAGNOSIS — D485 Neoplasm of uncertain behavior of skin: Secondary | ICD-10-CM | POA: Diagnosis not present

## 2012-10-22 DIAGNOSIS — I4891 Unspecified atrial fibrillation: Secondary | ICD-10-CM | POA: Diagnosis not present

## 2012-11-26 DIAGNOSIS — I4891 Unspecified atrial fibrillation: Secondary | ICD-10-CM | POA: Diagnosis not present

## 2012-12-11 ENCOUNTER — Encounter: Payer: Self-pay | Admitting: Internal Medicine

## 2012-12-11 DIAGNOSIS — I4891 Unspecified atrial fibrillation: Secondary | ICD-10-CM | POA: Diagnosis not present

## 2012-12-11 DIAGNOSIS — E7800 Pure hypercholesterolemia, unspecified: Secondary | ICD-10-CM | POA: Diagnosis not present

## 2012-12-17 DIAGNOSIS — F411 Generalized anxiety disorder: Secondary | ICD-10-CM | POA: Diagnosis not present

## 2012-12-17 DIAGNOSIS — N189 Chronic kidney disease, unspecified: Secondary | ICD-10-CM | POA: Diagnosis not present

## 2012-12-17 DIAGNOSIS — M109 Gout, unspecified: Secondary | ICD-10-CM | POA: Diagnosis not present

## 2012-12-17 DIAGNOSIS — I4891 Unspecified atrial fibrillation: Secondary | ICD-10-CM | POA: Diagnosis not present

## 2012-12-17 DIAGNOSIS — E7800 Pure hypercholesterolemia, unspecified: Secondary | ICD-10-CM | POA: Diagnosis not present

## 2012-12-17 DIAGNOSIS — Z23 Encounter for immunization: Secondary | ICD-10-CM | POA: Diagnosis not present

## 2012-12-18 ENCOUNTER — Ambulatory Visit (INDEPENDENT_AMBULATORY_CARE_PROVIDER_SITE_OTHER): Payer: Medicare Other | Admitting: Internal Medicine

## 2012-12-18 ENCOUNTER — Encounter: Payer: Self-pay | Admitting: Internal Medicine

## 2012-12-18 VITALS — BP 146/79 | HR 60 | Ht 70.5 in | Wt 198.0 lb

## 2012-12-18 DIAGNOSIS — I4892 Unspecified atrial flutter: Secondary | ICD-10-CM

## 2012-12-18 DIAGNOSIS — I4891 Unspecified atrial fibrillation: Secondary | ICD-10-CM

## 2012-12-18 NOTE — Progress Notes (Signed)
PCP: Estanislado Pandy, MD Primary Cardiologist:  Dr Antoine Poche  The patient presents today for routine electrophysiology followup.  Since last being seen in our clinic, the patient reports doing very well.  He denies any afib since his ablation.  Today, he denies symptoms of palpitations, chest pain, shortness of breath,  dizziness, presyncope, syncope, or neurologic sequela.  The patient feels that he is tolerating medications without difficulties and is otherwise without complaint today.   Past Medical History  Diagnosis Date  . Atrial flutter     s/p CTI ablation  . Hypertension   . Hyperlipidemia   . Obstructive sleep apnea (adult) (pediatric)     on CPAP  . Atrial fibrillation     paroxysmal, s/p PVI by Dr Johney Frame 8/12   Past Surgical History  Procedure Laterality Date  . Cardiac catheterization      nonobstructive CAD 2000  . Melanoma (other)      resected from his right shoulder  . Atrial ablation surgery      s/p CTI and PVI ablations by Dr Johney Frame 8/12    Current Outpatient Prescriptions  Medication Sig Dispense Refill  . Multiple Vitamins-Minerals (MULTIVITAL) tablet Take 1 tablet by mouth daily.        . naproxen sodium (ALEVE) 220 MG tablet Take 220 mg by mouth. As needed gout       . Omega-3 Fatty Acids (FISH OIL) 1000 MG CAPS Take 1 capsule by mouth daily.       . pravastatin (PRAVACHOL) 20 MG tablet Take 10 mg by mouth daily.       Marland Kitchen warfarin (COUMADIN) 5 MG tablet Use as directed by Dr. Neita Carp.       No current facility-administered medications for this visit.    Allergies  Allergen Reactions  . Diltiazem Hcl     REACTION: Looked like burned all over  . Lisinopril     REACTION: Looked like burned all over  . Metoprolol Succinate     REACTION: Looked like burned all over  . Propafenone Hcl     REACTION: Looked like burned all over    History   Social History  . Marital Status: Married    Spouse Name: N/A    Number of Children: N/A  . Years of Education:  N/A   Occupational History  . Not on file.   Social History Main Topics  . Smoking status: Never Smoker   . Smokeless tobacco: Never Used  . Alcohol Use: No  . Drug Use: No  . Sexual Activity: Not on file   Other Topics Concern  . 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 Prostate cancer Father   . Seizures Other   . Cancer Other   . Diabetes Other   . Heart disease Other     Physical Exam: Filed Vitals:   12/18/12 1137  BP: 146/79  Pulse: 60  Height: 5' 10.5" (1.791 m)  Weight: 198 lb (89.812 kg)    GEN- The patient is well appearing, alert and oriented x 3 today.   Head- normocephalic, atraumatic Eyes-  Sclera clear, conjunctiva pink Ears- hearing intact Oropharynx- clear Neck- supple, no JVP Lymph- no cervical lymphadenopathy Lungs- Clear to ausculation bilaterally, normal work of breathing Heart- Regular rate and rhythm, no murmurs, rubs or gallops, PMI not laterally displaced GI- soft, NT, ND, + BS Extremities-  no clubbing, cyanosis, or edema  ekg today reveals sinus rhythm 60 bpm, normal ekg Labs from Dr Neita Carp are reviewed.  Creatinine 12/11/12 was 1.38 (CrCl 55)   Assessment and Plan:  1. afib No recurrence s/p ablation CHADS2VASC score is at least 2 (age >36) Continue coumadin.  Today, I discussed novel anticoagulants including pradaxa, xarelto, and eliquis today as indicated for risk reduction in stroke and systemic emboli with nonvalvular atrial fibrillation.  Risks, benefits, and alternatives to each of these drugs were discussed at length today.He will consider switching to either xarelto or eliquis and will contact our office if he wishes to make this change. I will see again in 1 year

## 2012-12-18 NOTE — Patient Instructions (Signed)
Your physician wants you to follow-up in: 12 months with Dr Jacquiline Doe will receive a reminder letter in the mail two months in advance. If you don't receive a letter, please call our office to schedule the follow-up appointment.  Call me in Jan. To let me know about Eliquis or Xarelto  [phone]

## 2013-01-28 DIAGNOSIS — I4891 Unspecified atrial fibrillation: Secondary | ICD-10-CM | POA: Diagnosis not present

## 2013-02-12 DIAGNOSIS — I4891 Unspecified atrial fibrillation: Secondary | ICD-10-CM | POA: Diagnosis not present

## 2013-03-12 ENCOUNTER — Telehealth: Payer: Self-pay | Admitting: Internal Medicine

## 2013-03-12 DIAGNOSIS — I4891 Unspecified atrial fibrillation: Secondary | ICD-10-CM | POA: Diagnosis not present

## 2013-03-12 MED ORDER — RIVAROXABAN 20 MG PO TABS: 20.0000 mg | ORAL_TABLET | Freq: Every day | ORAL | Status: DC

## 2013-03-12 NOTE — Telephone Encounter (Signed)
New message   From last office visit discussion  on xarelto.    Patient  Has decided  to start medication until the first of year due to insurance.    When should he have another coumadin check the last one was in  12/12

## 2013-03-12 NOTE — Telephone Encounter (Addendum)
Will start Xarelto 20mg  daily---he is going to start the medication tomorrow. INR today 2.1 Medication has been called into his pharmacy

## 2013-07-04 DIAGNOSIS — N183 Chronic kidney disease, stage 3 unspecified: Secondary | ICD-10-CM | POA: Diagnosis not present

## 2013-07-04 DIAGNOSIS — E7800 Pure hypercholesterolemia, unspecified: Secondary | ICD-10-CM | POA: Diagnosis not present

## 2013-07-04 DIAGNOSIS — I4891 Unspecified atrial fibrillation: Secondary | ICD-10-CM | POA: Diagnosis not present

## 2013-07-08 DIAGNOSIS — J209 Acute bronchitis, unspecified: Secondary | ICD-10-CM | POA: Diagnosis not present

## 2013-07-08 DIAGNOSIS — R042 Hemoptysis: Secondary | ICD-10-CM | POA: Diagnosis not present

## 2013-07-23 DIAGNOSIS — M109 Gout, unspecified: Secondary | ICD-10-CM | POA: Diagnosis not present

## 2013-07-23 DIAGNOSIS — F411 Generalized anxiety disorder: Secondary | ICD-10-CM | POA: Diagnosis not present

## 2013-07-23 DIAGNOSIS — K2100 Gastro-esophageal reflux disease with esophagitis, without bleeding: Secondary | ICD-10-CM | POA: Diagnosis not present

## 2013-07-23 DIAGNOSIS — N183 Chronic kidney disease, stage 3 unspecified: Secondary | ICD-10-CM | POA: Diagnosis not present

## 2013-07-23 DIAGNOSIS — I4891 Unspecified atrial fibrillation: Secondary | ICD-10-CM | POA: Diagnosis not present

## 2013-07-23 DIAGNOSIS — E7800 Pure hypercholesterolemia, unspecified: Secondary | ICD-10-CM | POA: Diagnosis not present

## 2013-12-14 ENCOUNTER — Telehealth: Payer: Self-pay | Admitting: Internal Medicine

## 2013-12-14 NOTE — Telephone Encounter (Signed)
I spoke with patient by phone.  He is doing great post ablation and has had no afib.  He is off of AAD therapy. He continues to take xarelto but has recently reached the "donut hole." I told him that I would have Claiborne Billings try to obtain samples for him and contact him this week. He may also be due to follow-up in the near future.  I will ask Melissa to arrange follow-up.

## 2013-12-24 DIAGNOSIS — E780 Pure hypercholesterolemia: Secondary | ICD-10-CM | POA: Diagnosis not present

## 2013-12-24 DIAGNOSIS — N179 Acute kidney failure, unspecified: Secondary | ICD-10-CM | POA: Diagnosis not present

## 2013-12-24 DIAGNOSIS — K210 Gastro-esophageal reflux disease with esophagitis: Secondary | ICD-10-CM | POA: Diagnosis not present

## 2013-12-24 DIAGNOSIS — I4891 Unspecified atrial fibrillation: Secondary | ICD-10-CM | POA: Diagnosis not present

## 2013-12-31 DIAGNOSIS — F411 Generalized anxiety disorder: Secondary | ICD-10-CM | POA: Diagnosis not present

## 2013-12-31 DIAGNOSIS — I482 Chronic atrial fibrillation: Secondary | ICD-10-CM | POA: Diagnosis not present

## 2013-12-31 DIAGNOSIS — N183 Chronic kidney disease, stage 3 (moderate): Secondary | ICD-10-CM | POA: Diagnosis not present

## 2013-12-31 DIAGNOSIS — Z23 Encounter for immunization: Secondary | ICD-10-CM | POA: Diagnosis not present

## 2013-12-31 DIAGNOSIS — K210 Gastro-esophageal reflux disease with esophagitis: Secondary | ICD-10-CM | POA: Diagnosis not present

## 2013-12-31 DIAGNOSIS — E780 Pure hypercholesterolemia: Secondary | ICD-10-CM | POA: Diagnosis not present

## 2014-01-14 ENCOUNTER — Encounter: Payer: Self-pay | Admitting: Internal Medicine

## 2014-01-14 ENCOUNTER — Ambulatory Visit (INDEPENDENT_AMBULATORY_CARE_PROVIDER_SITE_OTHER): Payer: Medicare Other | Admitting: Internal Medicine

## 2014-01-14 VITALS — BP 130/80 | HR 61 | Ht 70.0 in | Wt 197.8 lb

## 2014-01-14 DIAGNOSIS — I4891 Unspecified atrial fibrillation: Secondary | ICD-10-CM | POA: Diagnosis not present

## 2014-01-14 DIAGNOSIS — I480 Paroxysmal atrial fibrillation: Secondary | ICD-10-CM | POA: Diagnosis not present

## 2014-01-14 MED ORDER — RIVAROXABAN 20 MG PO TABS: 20.0000 mg | ORAL_TABLET | Freq: Every day | ORAL | Status: DC

## 2014-01-14 NOTE — Patient Instructions (Signed)
Your physician wants you to follow-up in: 12 months with Dr Allred You will receive a reminder letter in the mail two months in advance. If you don't receive a letter, please call our office to schedule the follow-up appointment.  

## 2014-01-14 NOTE — Progress Notes (Signed)
PCP: Manon Hilding, MD Primary Cardiologist:  Dr Percival Spanish  The patient presents today for routine electrophysiology followup.  Since last being seen in our clinic, the patient reports doing very well. Today, he denies symptoms of palpitations, chest pain, shortness of breath,  dizziness, presyncope, syncope, or neurologic sequela.  The patient feels that he is tolerating medications without difficulties and is otherwise without complaint today.   Past Medical History  Diagnosis Date  . Atrial flutter     s/p CTI ablation  . Hypertension   . Hyperlipidemia   . Obstructive sleep apnea (adult) (pediatric)     on CPAP  . Atrial fibrillation     paroxysmal, s/p PVI by Dr Rayann Heman 8/12   Past Surgical History  Procedure Laterality Date  . Cardiac catheterization      nonobstructive CAD 2000  . Melanoma (other)      resected from his right shoulder  . Atrial ablation surgery      s/p CTI and PVI ablations by Dr Rayann Heman 8/12    Current Outpatient Prescriptions  Medication Sig Dispense Refill  . Multiple Vitamins-Minerals (MULTIVITAL) tablet Take 1 tablet by mouth daily.      . naproxen sodium (ALEVE) 220 MG tablet Take 220 mg by mouth daily as needed (gout).     . Omega-3 Fatty Acids (FISH OIL) 1000 MG CAPS Take 1 capsule by mouth daily.     . pravastatin (PRAVACHOL) 20 MG tablet Take 10 mg by mouth daily.     . rivaroxaban (XARELTO) 20 MG TABS tablet Take 1 tablet (20 mg total) by mouth daily with supper. 90 tablet 3   No current facility-administered medications for this visit.    Allergies  Allergen Reactions  . Diltiazem Hcl     REACTION: Looked like burned all over  . Lisinopril     REACTION: Looked like burned all over  . Metoprolol Succinate     REACTION: Looked like burned all over  . Propafenone Hcl     REACTION: Looked like burned all over    History   Social History  . Marital Status: Married    Spouse Name: N/A    Number of Children: N/A  . Years of Education:  N/A   Occupational History  . Not on file.   Social History Main Topics  . Smoking status: Never Smoker   . Smokeless tobacco: Never Used  . Alcohol Use: No  . Drug Use: No  . Sexual Activity: Not on file   Other Topics Concern  . 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 Prostate cancer Father   . Seizures Other   . Cancer Other   . Diabetes Other   . Heart disease Other     Physical Exam: Filed Vitals:   01/14/14 0839  BP: 130/80  Pulse: 61  Height: 5\' 10"  (1.778 m)  Weight: 197 lb 12.8 oz (89.721 kg)    GEN- The patient is well appearing, alert and oriented x 3 today.   Head- normocephalic, atraumatic Eyes-  Sclera clear, conjunctiva pink Ears- hearing intact Oropharynx- clear Neck- supple, no JVP Lymph- no cervical lymphadenopathy Lungs- Clear to ausculation bilaterally, normal work of breathing Heart- Regular rate and rhythm, no murmurs, rubs or gallops, PMI not laterally displaced GI- soft, NT, ND, + BS Extremities- no clubbing, cyanosis, or edema  ekg today reveals  sinus rhythm 61 bpm, normal ekg Labs from Dr Quintin Alto are reviewed.    Assessment and Plan:  1. afib Maintaining sinus rhythm post ablation CHADS2VASC score is at least 2 (age >32) Continue xarelto Dr Quintin Alto is following CrCl  Return to see me in 1 year

## 2014-02-05 ENCOUNTER — Telehealth: Payer: Self-pay | Admitting: *Deleted

## 2014-02-05 NOTE — Telephone Encounter (Signed)
Samples from 01/14/14 put back.

## 2014-04-20 DIAGNOSIS — D225 Melanocytic nevi of trunk: Secondary | ICD-10-CM | POA: Diagnosis not present

## 2014-04-20 DIAGNOSIS — L570 Actinic keratosis: Secondary | ICD-10-CM | POA: Diagnosis not present

## 2014-04-20 DIAGNOSIS — D485 Neoplasm of uncertain behavior of skin: Secondary | ICD-10-CM | POA: Diagnosis not present

## 2014-04-20 DIAGNOSIS — Z85828 Personal history of other malignant neoplasm of skin: Secondary | ICD-10-CM | POA: Diagnosis not present

## 2014-06-25 DIAGNOSIS — N183 Chronic kidney disease, stage 3 (moderate): Secondary | ICD-10-CM | POA: Diagnosis not present

## 2014-06-25 DIAGNOSIS — E780 Pure hypercholesterolemia: Secondary | ICD-10-CM | POA: Diagnosis not present

## 2014-06-25 DIAGNOSIS — I482 Chronic atrial fibrillation: Secondary | ICD-10-CM | POA: Diagnosis not present

## 2014-07-09 DIAGNOSIS — K210 Gastro-esophageal reflux disease with esophagitis: Secondary | ICD-10-CM | POA: Diagnosis not present

## 2014-07-09 DIAGNOSIS — I482 Chronic atrial fibrillation: Secondary | ICD-10-CM | POA: Diagnosis not present

## 2014-07-09 DIAGNOSIS — F411 Generalized anxiety disorder: Secondary | ICD-10-CM | POA: Diagnosis not present

## 2014-07-09 DIAGNOSIS — E780 Pure hypercholesterolemia: Secondary | ICD-10-CM | POA: Diagnosis not present

## 2014-07-09 DIAGNOSIS — N183 Chronic kidney disease, stage 3 (moderate): Secondary | ICD-10-CM | POA: Diagnosis not present

## 2014-07-09 DIAGNOSIS — Z23 Encounter for immunization: Secondary | ICD-10-CM | POA: Diagnosis not present

## 2014-07-10 DIAGNOSIS — N183 Chronic kidney disease, stage 3 (moderate): Secondary | ICD-10-CM | POA: Diagnosis not present

## 2014-10-20 DIAGNOSIS — Z85828 Personal history of other malignant neoplasm of skin: Secondary | ICD-10-CM | POA: Diagnosis not present

## 2014-10-20 DIAGNOSIS — L570 Actinic keratosis: Secondary | ICD-10-CM | POA: Diagnosis not present

## 2014-12-29 DIAGNOSIS — H353131 Nonexudative age-related macular degeneration, bilateral, early dry stage: Secondary | ICD-10-CM | POA: Diagnosis not present

## 2014-12-29 DIAGNOSIS — Z961 Presence of intraocular lens: Secondary | ICD-10-CM | POA: Diagnosis not present

## 2015-01-01 DIAGNOSIS — E7800 Pure hypercholesterolemia, unspecified: Secondary | ICD-10-CM | POA: Diagnosis not present

## 2015-01-01 DIAGNOSIS — I9589 Other hypotension: Secondary | ICD-10-CM | POA: Diagnosis not present

## 2015-01-04 DIAGNOSIS — N183 Chronic kidney disease, stage 3 (moderate): Secondary | ICD-10-CM | POA: Diagnosis not present

## 2015-01-04 DIAGNOSIS — F411 Generalized anxiety disorder: Secondary | ICD-10-CM | POA: Diagnosis not present

## 2015-01-04 DIAGNOSIS — I482 Chronic atrial fibrillation: Secondary | ICD-10-CM | POA: Diagnosis not present

## 2015-01-04 DIAGNOSIS — K210 Gastro-esophageal reflux disease with esophagitis: Secondary | ICD-10-CM | POA: Diagnosis not present

## 2015-01-04 DIAGNOSIS — N529 Male erectile dysfunction, unspecified: Secondary | ICD-10-CM | POA: Diagnosis not present

## 2015-01-04 DIAGNOSIS — Z23 Encounter for immunization: Secondary | ICD-10-CM | POA: Diagnosis not present

## 2015-02-01 ENCOUNTER — Ambulatory Visit (INDEPENDENT_AMBULATORY_CARE_PROVIDER_SITE_OTHER): Payer: Medicare Other | Admitting: Internal Medicine

## 2015-02-01 ENCOUNTER — Encounter: Payer: Self-pay | Admitting: Internal Medicine

## 2015-02-01 VITALS — BP 138/86 | HR 64 | Ht 70.0 in | Wt 206.0 lb

## 2015-02-01 DIAGNOSIS — N529 Male erectile dysfunction, unspecified: Secondary | ICD-10-CM | POA: Diagnosis not present

## 2015-02-01 DIAGNOSIS — I480 Paroxysmal atrial fibrillation: Secondary | ICD-10-CM | POA: Diagnosis not present

## 2015-02-01 MED ORDER — RIVAROXABAN 20 MG PO TABS: 20.0000 mg | ORAL_TABLET | Freq: Every day | ORAL | Status: DC

## 2015-02-01 NOTE — Progress Notes (Signed)
PCP: Manon Hilding, MD Primary Cardiologist:  Dr Percival Spanish  The patient presents today for routine electrophysiology followup.  Since last being seen in our clinic, the patient reports doing very well.  No symptoms of afib.  Today, he denies symptoms of palpitations, chest pain, shortness of breath,  dizziness, presyncope, syncope, or neurologic sequela.  + erectile dysfunction.  The patient feels that he is tolerating medications without difficulties and is otherwise without complaint today.   Past Medical History  Diagnosis Date  . Atrial flutter ([facility])     s/p CTI ablation  . Hypertension   . Hyperlipidemia   . Obstructive sleep apnea (adult) (pediatric)     on CPAP  . Atrial fibrillation ([facility])     paroxysmal, s/p PVI by Dr Rayann Heman 8/12   Past Surgical History  Procedure Laterality Date  . Cardiac catheterization      nonobstructive CAD 2000  . Melanoma (other)      resected from his right shoulder  . Atrial ablation surgery      s/p CTI and PVI ablations by Dr Rayann Heman 8/12    Current Outpatient Prescriptions  Medication Sig Dispense Refill  . Multiple Vitamins-Minerals (MULTIVITAL) tablet Take 1 tablet by mouth daily.      . naproxen sodium (ALEVE) 220 MG tablet Take 220 mg by mouth daily as needed (gout).     . Omega-3 Fatty Acids (FISH OIL PO) Take 1,600 mg by mouth daily.    . rivaroxaban (XARELTO) 20 MG TABS tablet Take 1 tablet (20 mg total) by mouth daily with supper. 90 tablet 3   No current facility-administered medications for this visit.    Allergies  Allergen Reactions  . Diltiazem Hcl     REACTION: Looked like burned all over  . Lisinopril     REACTION: Looked like burned all over  . Metoprolol Succinate     REACTION: Looked like burned all over  . Propafenone Hcl     REACTION: Looked like burned all over    Social History   Social History  . Marital Status: Married    Spouse Name: N/A  . Number of Children: N/A  . Years of Education: N/A    Occupational History  . Not on file.   Social History Main Topics  . Smoking status: Never Smoker   . Smokeless tobacco: Never Used  . Alcohol Use: No  . Drug Use: No  . Sexual Activity: Not on file   Other Topics Concern  . 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 Prostate cancer Father   . Seizures Other   . Cancer Other   . Diabetes Other   . Heart disease Other     Physical Exam: Filed Vitals:   02/01/15 1015  BP: 138/86  Pulse: 64  Height: 5\' 10"  (1.778 m)  Weight: 206 lb (93.441 kg)    GEN- The patient is well appearing, alert and oriented x 3 today.   Head- normocephalic, atraumatic Eyes-  Sclera clear, conjunctiva pink Ears- hearing intact Oropharynx- clear Neck- supple, no JVP Lymph- no cervical lymphadenopathy Lungs- Clear to ausculation bilaterally, normal work of breathing Heart- Regular rate and rhythm, no murmurs, rubs or gallops, PMI not laterally displaced GI- soft, NT, ND, + BS Extremities- no clubbing, cyanosis, or edema  ekg today reveals sinus rhythm 64 bpm, PR 244 msec, PACs normal  ekg Labs from Dr Quintin Alto are reviewed.    Assessment and Plan:  1. afib Maintaining sinus rhythm post ablation off of AAD therapy CHADS2VASC score is at least 2 (age >1) Continue xarelto Dr Quintin Alto is following CrCl  2. Erectile dyfunction Ok to use viagra from my standpoint Pt will discuss with PCP  Return to see me in 1 year unless problems arise in the interim  [name] MD, [facility] 02/01/2015 10:48 AM

## 2015-02-01 NOTE — Patient Instructions (Signed)
Medication Instructions:  Your physician recommends that you continue on your current medications as directed. Please refer to the Current Medication list given to you today.   Labwork: N/A  Testing/Procedures: N/A  Follow-Up: Your physician wants you to follow-up in: 12 months with Dr. Rayann Heman. You will receive a reminder letter in the mail two months in advance. If you don't receive a letter, please call our office to schedule the follow-up appointment.   Any Other Special Instructions Will Be Listed Below (If Applicable).     If you need a refill on your cardiac medications before your next appointment, please call your pharmacy.

## 2015-04-27 DIAGNOSIS — N183 Chronic kidney disease, stage 3 (moderate): Secondary | ICD-10-CM | POA: Diagnosis not present

## 2015-04-27 DIAGNOSIS — K210 Gastro-esophageal reflux disease with esophagitis: Secondary | ICD-10-CM | POA: Diagnosis not present

## 2015-04-27 DIAGNOSIS — E7800 Pure hypercholesterolemia, unspecified: Secondary | ICD-10-CM | POA: Diagnosis not present

## 2015-05-04 DIAGNOSIS — F411 Generalized anxiety disorder: Secondary | ICD-10-CM | POA: Diagnosis not present

## 2015-05-04 DIAGNOSIS — Z13220 Encounter for screening for lipoid disorders: Secondary | ICD-10-CM | POA: Diagnosis not present

## 2015-05-04 DIAGNOSIS — N183 Chronic kidney disease, stage 3 (moderate): Secondary | ICD-10-CM | POA: Diagnosis not present

## 2015-05-04 DIAGNOSIS — I482 Chronic atrial fibrillation: Secondary | ICD-10-CM | POA: Diagnosis not present

## 2015-05-04 DIAGNOSIS — M109 Gout, unspecified: Secondary | ICD-10-CM | POA: Diagnosis not present

## 2015-05-04 DIAGNOSIS — N529 Male erectile dysfunction, unspecified: Secondary | ICD-10-CM | POA: Diagnosis not present

## 2015-05-04 DIAGNOSIS — K210 Gastro-esophageal reflux disease with esophagitis: Secondary | ICD-10-CM | POA: Diagnosis not present

## 2015-07-13 DIAGNOSIS — D225 Melanocytic nevi of trunk: Secondary | ICD-10-CM | POA: Diagnosis not present

## 2015-07-13 DIAGNOSIS — L570 Actinic keratosis: Secondary | ICD-10-CM | POA: Diagnosis not present

## 2015-07-13 DIAGNOSIS — D485 Neoplasm of uncertain behavior of skin: Secondary | ICD-10-CM | POA: Diagnosis not present

## 2015-07-13 DIAGNOSIS — Z85828 Personal history of other malignant neoplasm of skin: Secondary | ICD-10-CM | POA: Diagnosis not present

## 2015-09-07 DIAGNOSIS — E7800 Pure hypercholesterolemia, unspecified: Secondary | ICD-10-CM | POA: Diagnosis not present

## 2015-09-07 DIAGNOSIS — N183 Chronic kidney disease, stage 3 (moderate): Secondary | ICD-10-CM | POA: Diagnosis not present

## 2015-09-07 DIAGNOSIS — M109 Gout, unspecified: Secondary | ICD-10-CM | POA: Diagnosis not present

## 2015-09-07 DIAGNOSIS — Z13220 Encounter for screening for lipoid disorders: Secondary | ICD-10-CM | POA: Diagnosis not present

## 2015-09-09 DIAGNOSIS — I482 Chronic atrial fibrillation: Secondary | ICD-10-CM | POA: Diagnosis not present

## 2015-09-09 DIAGNOSIS — N183 Chronic kidney disease, stage 3 (moderate): Secondary | ICD-10-CM | POA: Diagnosis not present

## 2015-09-09 DIAGNOSIS — F411 Generalized anxiety disorder: Secondary | ICD-10-CM | POA: Diagnosis not present

## 2015-09-09 DIAGNOSIS — E7800 Pure hypercholesterolemia, unspecified: Secondary | ICD-10-CM | POA: Diagnosis not present

## 2015-09-09 DIAGNOSIS — K210 Gastro-esophageal reflux disease with esophagitis: Secondary | ICD-10-CM | POA: Diagnosis not present

## 2015-09-09 DIAGNOSIS — Z6828 Body mass index (BMI) 28.0-28.9, adult: Secondary | ICD-10-CM | POA: Diagnosis not present

## 2015-09-09 DIAGNOSIS — M109 Gout, unspecified: Secondary | ICD-10-CM | POA: Diagnosis not present

## 2015-09-09 DIAGNOSIS — N529 Male erectile dysfunction, unspecified: Secondary | ICD-10-CM | POA: Diagnosis not present

## 2015-12-09 DIAGNOSIS — Z23 Encounter for immunization: Secondary | ICD-10-CM | POA: Diagnosis not present

## 2016-01-26 ENCOUNTER — Telehealth: Payer: Self-pay | Admitting: Internal Medicine

## 2016-01-26 NOTE — Telephone Encounter (Signed)
Patient calling the office for samples of medication: ° ° °1.  What medication and dosage are you requesting samples for? Xarelto 20 mg ° °2.  Are you currently out of this medication? No ° ° °

## 2016-01-26 NOTE — Telephone Encounter (Signed)
Patient aware samples will be placed at the front desk. 

## 2016-02-18 ENCOUNTER — Encounter: Payer: Self-pay | Admitting: Internal Medicine

## 2016-02-25 ENCOUNTER — Telehealth: Payer: Self-pay | Admitting: Internal Medicine

## 2016-02-25 MED ORDER — RIVAROXABAN 20 MG PO TABS: 20.0000 mg | ORAL_TABLET | Freq: Every day | ORAL | 0 refills | Status: DC

## 2016-02-25 NOTE — Telephone Encounter (Signed)
New Message   *STAT* If patient is at the pharmacy, call can be transferred to refill team.   1. Which medications need to be refilled? (please list name of each medication and dose if known)  rivaroxaban (Xarelto) 20 mg tablets once daily at supper  2. Which pharmacy/location (including street and city if local pharmacy) is medication to be sent to? [place], [place], [place], [place]  3. Do they need a 30 day or 90 day supply? 90 day supply

## 2016-03-09 DIAGNOSIS — I9589 Other hypotension: Secondary | ICD-10-CM | POA: Diagnosis not present

## 2016-03-09 DIAGNOSIS — R7301 Impaired fasting glucose: Secondary | ICD-10-CM | POA: Diagnosis not present

## 2016-03-09 DIAGNOSIS — K210 Gastro-esophageal reflux disease with esophagitis: Secondary | ICD-10-CM | POA: Diagnosis not present

## 2016-03-09 DIAGNOSIS — E7800 Pure hypercholesterolemia, unspecified: Secondary | ICD-10-CM | POA: Diagnosis not present

## 2016-03-09 DIAGNOSIS — M109 Gout, unspecified: Secondary | ICD-10-CM | POA: Diagnosis not present

## 2016-03-10 ENCOUNTER — Ambulatory Visit: Payer: Medicare Other | Admitting: Internal Medicine

## 2016-03-15 ENCOUNTER — Other Ambulatory Visit: Payer: Self-pay | Admitting: Internal Medicine

## 2016-03-15 NOTE — Telephone Encounter (Signed)
°*  STAT* If patient is at the pharmacy, call can be transferred to refill team.   1. Which medications need to be refilled? (please list name of each medication and dose if known) Xarelto  2. Which pharmacy/location (including street and city if local pharmacy) is medication to be sent to? [facility] in [place]  3. Do they need a 30 day or 90 day supply?  90 day

## 2016-03-16 ENCOUNTER — Ambulatory Visit: Payer: Medicare Other | Admitting: Internal Medicine

## 2016-03-17 MED ORDER — RIVAROXABAN 20 MG PO TABS
20.0000 mg | ORAL_TABLET | Freq: Every day | ORAL | 0 refills | Status: DC
Start: 2016-03-17 — End: 2016-06-22

## 2016-03-17 NOTE — Addendum Note (Signed)
Addended by: SUPPLE, MEGAN E on: 03/17/2016 01:41 PM   Modules accepted: Orders

## 2016-03-17 NOTE — Telephone Encounter (Addendum)
Patient has not had BMET or CBC in 6 years. No weight in over 1 year, last seen in office 02/01/15. Had annual OV scheduled for yesterday but clinic closed due to weather.  Called PCP Dr Quintin Alto for updated labs.  03/09/16: CMET - SCr 1.34  No Hgb on file. Weight 205.  CrCl = 26mL/min. Pt ok to remain on Xarelto 20mg  dose, refill sent in.

## 2016-03-22 ENCOUNTER — Encounter: Payer: Self-pay | Admitting: Internal Medicine

## 2016-03-22 ENCOUNTER — Ambulatory Visit (INDEPENDENT_AMBULATORY_CARE_PROVIDER_SITE_OTHER): Payer: Medicare Other | Admitting: Internal Medicine

## 2016-03-22 VITALS — BP 114/66 | HR 66 | Ht 70.0 in | Wt 211.0 lb

## 2016-03-22 DIAGNOSIS — I480 Paroxysmal atrial fibrillation: Secondary | ICD-10-CM

## 2016-03-22 DIAGNOSIS — I10 Essential (primary) hypertension: Secondary | ICD-10-CM | POA: Diagnosis not present

## 2016-03-22 NOTE — Progress Notes (Signed)
PCP: Manon Hilding, MD Primary Cardiologist:  Dr Percival Spanish  The patient presents today for routine electrophysiology followup.  Since last being seen in our clinic, the patient reports doing very well.  No symptoms of afib.  Today, he denies symptoms of palpitations, chest pain, shortness of breath,  dizziness, presyncope, syncope, or neurologic sequela.  + erectile dysfunction is stable.  The patient feels that he is tolerating medications without difficulties and is otherwise without complaint today.   Past Medical History:  Diagnosis Date  . Atrial fibrillation ([facility])    paroxysmal, s/p PVI by Dr Rayann Heman 8/12  . Atrial flutter ([place])    s/p CTI ablation  . Hyperlipidemia   . Hypertension   . Obstructive sleep apnea (adult) (pediatric)    on CPAP   Past Surgical History:  Procedure Laterality Date  . ATRIAL ABLATION SURGERY     s/p CTI and PVI ablations by Dr Rayann Heman 8/12  . CARDIAC CATHETERIZATION     nonobstructive CAD 2000  . melanoma (other)     resected from his right shoulder    Current Outpatient Prescriptions  Medication Sig Dispense Refill  . Multiple Vitamins-Minerals (MULTIVITAL) tablet Take 1 tablet by mouth daily.      . naproxen sodium (ALEVE) 220 MG tablet Take 220 mg by mouth daily as needed (gout).     . Omega-3 Fatty Acids (FISH OIL PO) Take 1,600 mg by mouth daily.    . rivaroxaban (XARELTO) 20 MG TABS tablet Take 1 tablet (20 mg total) by mouth daily with supper. Please keep 03/16/16 appt for further refills. 90 tablet 0   No current facility-administered medications for this visit.     Allergies  Allergen Reactions  . Diltiazem Hcl     REACTION: Looked like burned all over  . Lisinopril     REACTION: Looked like burned all over  . Metoprolol Succinate     REACTION: Looked like burned all over  . Propafenone Hcl     REACTION: Looked like burned all over    Social History   Social History  . Marital status: Married    Spouse name: N/A  . Number of  children: N/A  . Years of education: N/A   Occupational History  . Not on file.   Social History Main Topics  . Smoking status: Never Smoker  . Smokeless tobacco: Never Used  . Alcohol use No  . Drug use: No  . Sexual activity: Not on file   Other Topics Concern  . 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 Prostate cancer Father   . Seizures Other   . Cancer Other   . Diabetes Other   . Heart disease Other     Physical Exam: Vitals:   03/22/16 1143  BP: 114/66  Pulse: 66  SpO2: 98%  Weight: 211 lb (95.7 kg)  Height: 5\' 10"  (1.778 m)    GEN- The patient is well appearing, alert and oriented x 3 today.   Head- normocephalic, atraumatic Eyes-  Sclera clear, conjunctiva pink Ears- hearing intact Oropharynx- clear Neck- supple,  Lungs- Clear to ausculation bilaterally, normal work of breathing Heart- Regular rate and rhythm, no murmurs, rubs or gallops, PMI not laterally displaced GI- soft, NT, ND, + BS Extremities- no clubbing, cyanosis, or edema  ekg today reveals sinus rhythm 64 bpm, PR 208 msec, normal ekg  Labs from Dr Quintin Alto are reviewed.  Creat 1.34 (CrCl 58)   Assessment and Plan:  1. afib Maintaining sinus rhythm post ablation off of AAD therapy CHADS2VASC score is at least 3 (age >76) Continue xarelto  2. HTN Stable No change required today  Return to see me in 1 year unless problems arise in the interim  [name] MD, [facility] 03/22/2016 11:52 AM

## 2016-03-22 NOTE — Patient Instructions (Signed)

## 2016-03-28 DIAGNOSIS — N183 Chronic kidney disease, stage 3 (moderate): Secondary | ICD-10-CM | POA: Diagnosis not present

## 2016-03-28 DIAGNOSIS — N529 Male erectile dysfunction, unspecified: Secondary | ICD-10-CM | POA: Diagnosis not present

## 2016-03-28 DIAGNOSIS — I482 Chronic atrial fibrillation: Secondary | ICD-10-CM | POA: Diagnosis not present

## 2016-03-28 DIAGNOSIS — F411 Generalized anxiety disorder: Secondary | ICD-10-CM | POA: Diagnosis not present

## 2016-03-28 DIAGNOSIS — K210 Gastro-esophageal reflux disease with esophagitis: Secondary | ICD-10-CM | POA: Diagnosis not present

## 2016-03-28 DIAGNOSIS — R7301 Impaired fasting glucose: Secondary | ICD-10-CM | POA: Diagnosis not present

## 2016-03-28 DIAGNOSIS — E7800 Pure hypercholesterolemia, unspecified: Secondary | ICD-10-CM | POA: Diagnosis not present

## 2016-03-28 DIAGNOSIS — M109 Gout, unspecified: Secondary | ICD-10-CM | POA: Diagnosis not present

## 2016-06-22 ENCOUNTER — Other Ambulatory Visit: Payer: Self-pay | Admitting: Internal Medicine

## 2016-06-29 DIAGNOSIS — H524 Presbyopia: Secondary | ICD-10-CM | POA: Diagnosis not present

## 2016-06-29 DIAGNOSIS — H353131 Nonexudative age-related macular degeneration, bilateral, early dry stage: Secondary | ICD-10-CM | POA: Diagnosis not present

## 2016-06-29 DIAGNOSIS — Z961 Presence of intraocular lens: Secondary | ICD-10-CM | POA: Diagnosis not present

## 2016-08-21 ENCOUNTER — Inpatient Hospital Stay (HOSPITAL_COMMUNITY)
Admission: AD | Admit: 2016-08-21 | Discharge: 2016-09-12 | DRG: 329 | Disposition: A | Discharge status: Home Health | Payer: Medicare Other | Source: Other Acute Inpatient Hospital | Attending: Internal Medicine | Admitting: Internal Medicine

## 2016-08-21 ENCOUNTER — Encounter (HOSPITAL_COMMUNITY): Payer: Self-pay

## 2016-08-21 DIAGNOSIS — R55 Syncope and collapse: Secondary | ICD-10-CM | POA: Diagnosis not present

## 2016-08-21 DIAGNOSIS — D72825 Bandemia: Secondary | ICD-10-CM | POA: Diagnosis not present

## 2016-08-21 DIAGNOSIS — Z452 Encounter for adjustment and management of vascular access device: Secondary | ICD-10-CM | POA: Diagnosis not present

## 2016-08-21 DIAGNOSIS — G4733 Obstructive sleep apnea (adult) (pediatric): Secondary | ICD-10-CM | POA: Diagnosis present

## 2016-08-21 DIAGNOSIS — I5032 Chronic diastolic (congestive) heart failure: Secondary | ICD-10-CM | POA: Diagnosis present

## 2016-08-21 DIAGNOSIS — Z85820 Personal history of malignant melanoma of skin: Secondary | ICD-10-CM

## 2016-08-21 DIAGNOSIS — K922 Gastrointestinal hemorrhage, unspecified: Secondary | ICD-10-CM | POA: Diagnosis present

## 2016-08-21 DIAGNOSIS — D120 Benign neoplasm of cecum: Secondary | ICD-10-CM | POA: Diagnosis not present

## 2016-08-21 DIAGNOSIS — Z01818 Encounter for other preprocedural examination: Secondary | ICD-10-CM | POA: Diagnosis not present

## 2016-08-21 DIAGNOSIS — Z8042 Family history of malignant neoplasm of prostate: Secondary | ICD-10-CM | POA: Diagnosis not present

## 2016-08-21 DIAGNOSIS — K641 Second degree hemorrhoids: Secondary | ICD-10-CM | POA: Diagnosis present

## 2016-08-21 DIAGNOSIS — K5731 Diverticulosis of large intestine without perforation or abscess with bleeding: Secondary | ICD-10-CM | POA: Diagnosis present

## 2016-08-21 DIAGNOSIS — J9811 Atelectasis: Secondary | ICD-10-CM | POA: Diagnosis not present

## 2016-08-21 DIAGNOSIS — E7800 Pure hypercholesterolemia, unspecified: Secondary | ICD-10-CM | POA: Diagnosis not present

## 2016-08-21 DIAGNOSIS — K644 Residual hemorrhoidal skin tags: Secondary | ICD-10-CM | POA: Diagnosis present

## 2016-08-21 DIAGNOSIS — I4892 Unspecified atrial flutter: Secondary | ICD-10-CM | POA: Diagnosis present

## 2016-08-21 DIAGNOSIS — L270 Generalized skin eruption due to drugs and medicaments taken internally: Secondary | ICD-10-CM | POA: Diagnosis present

## 2016-08-21 DIAGNOSIS — K639 Disease of intestine, unspecified: Secondary | ICD-10-CM | POA: Diagnosis not present

## 2016-08-21 DIAGNOSIS — Z7901 Long term (current) use of anticoagulants: Secondary | ICD-10-CM | POA: Diagnosis not present

## 2016-08-21 DIAGNOSIS — I480 Paroxysmal atrial fibrillation: Secondary | ICD-10-CM | POA: Diagnosis present

## 2016-08-21 DIAGNOSIS — J189 Pneumonia, unspecified organism: Secondary | ICD-10-CM | POA: Diagnosis not present

## 2016-08-21 DIAGNOSIS — Z888 Allergy status to other drugs, medicaments and biological substances status: Secondary | ICD-10-CM

## 2016-08-21 DIAGNOSIS — Z6829 Body mass index (BMI) 29.0-29.9, adult: Secondary | ICD-10-CM | POA: Diagnosis not present

## 2016-08-21 DIAGNOSIS — K5751 Diverticulosis of both small and large intestine without perforation or abscess with bleeding: Secondary | ICD-10-CM | POA: Diagnosis not present

## 2016-08-21 DIAGNOSIS — R600 Localized edema: Secondary | ICD-10-CM | POA: Diagnosis not present

## 2016-08-21 DIAGNOSIS — T462X5A Adverse effect of other antidysrhythmic drugs, initial encounter: Secondary | ICD-10-CM | POA: Diagnosis present

## 2016-08-21 DIAGNOSIS — Z800 Family history of malignant neoplasm of digestive organs: Secondary | ICD-10-CM

## 2016-08-21 DIAGNOSIS — C180 Malignant neoplasm of cecum: Secondary | ICD-10-CM | POA: Diagnosis present

## 2016-08-21 DIAGNOSIS — D6959 Other secondary thrombocytopenia: Secondary | ICD-10-CM | POA: Diagnosis present

## 2016-08-21 DIAGNOSIS — M79609 Pain in unspecified limb: Secondary | ICD-10-CM | POA: Diagnosis not present

## 2016-08-21 DIAGNOSIS — N879 Dysplasia of cervix uteri, unspecified: Secondary | ICD-10-CM | POA: Diagnosis not present

## 2016-08-21 DIAGNOSIS — D490 Neoplasm of unspecified behavior of digestive system: Secondary | ICD-10-CM | POA: Diagnosis not present

## 2016-08-21 DIAGNOSIS — R21 Rash and other nonspecific skin eruption: Secondary | ICD-10-CM | POA: Diagnosis not present

## 2016-08-21 DIAGNOSIS — D696 Thrombocytopenia, unspecified: Secondary | ICD-10-CM

## 2016-08-21 DIAGNOSIS — I129 Hypertensive chronic kidney disease with stage 1 through stage 4 chronic kidney disease, or unspecified chronic kidney disease: Secondary | ICD-10-CM | POA: Diagnosis not present

## 2016-08-21 DIAGNOSIS — Z79899 Other long term (current) drug therapy: Secondary | ICD-10-CM | POA: Diagnosis not present

## 2016-08-21 DIAGNOSIS — F419 Anxiety disorder, unspecified: Secondary | ICD-10-CM | POA: Diagnosis present

## 2016-08-21 DIAGNOSIS — N183 Chronic kidney disease, stage 3 unspecified: Secondary | ICD-10-CM

## 2016-08-21 DIAGNOSIS — I130 Hypertensive heart and chronic kidney disease with heart failure and stage 1 through stage 4 chronic kidney disease, or unspecified chronic kidney disease: Secondary | ICD-10-CM | POA: Diagnosis present

## 2016-08-21 DIAGNOSIS — K9189 Other postprocedural complications and disorders of digestive system: Secondary | ICD-10-CM | POA: Diagnosis not present

## 2016-08-21 DIAGNOSIS — I440 Atrioventricular block, first degree: Secondary | ICD-10-CM | POA: Diagnosis present

## 2016-08-21 DIAGNOSIS — D62 Acute posthemorrhagic anemia: Secondary | ICD-10-CM | POA: Diagnosis not present

## 2016-08-21 DIAGNOSIS — K567 Ileus, unspecified: Secondary | ICD-10-CM | POA: Diagnosis not present

## 2016-08-21 DIAGNOSIS — R609 Edema, unspecified: Secondary | ICD-10-CM | POA: Diagnosis not present

## 2016-08-21 DIAGNOSIS — D72829 Elevated white blood cell count, unspecified: Secondary | ICD-10-CM | POA: Diagnosis not present

## 2016-08-21 DIAGNOSIS — I4891 Unspecified atrial fibrillation: Secondary | ICD-10-CM | POA: Diagnosis not present

## 2016-08-21 DIAGNOSIS — M109 Gout, unspecified: Secondary | ICD-10-CM

## 2016-08-21 DIAGNOSIS — F319 Bipolar disorder, unspecified: Secondary | ICD-10-CM | POA: Diagnosis present

## 2016-08-21 DIAGNOSIS — D122 Benign neoplasm of ascending colon: Secondary | ICD-10-CM | POA: Diagnosis not present

## 2016-08-21 DIAGNOSIS — C189 Malignant neoplasm of colon, unspecified: Secondary | ICD-10-CM | POA: Diagnosis not present

## 2016-08-21 DIAGNOSIS — N281 Cyst of kidney, acquired: Secondary | ICD-10-CM | POA: Diagnosis not present

## 2016-08-21 DIAGNOSIS — K625 Hemorrhage of anus and rectum: Secondary | ICD-10-CM | POA: Diagnosis not present

## 2016-08-21 DIAGNOSIS — K921 Melena: Secondary | ICD-10-CM | POA: Diagnosis not present

## 2016-08-21 DIAGNOSIS — R404 Transient alteration of awareness: Secondary | ICD-10-CM | POA: Diagnosis not present

## 2016-08-21 DIAGNOSIS — I482 Chronic atrial fibrillation: Secondary | ICD-10-CM | POA: Diagnosis present

## 2016-08-21 DIAGNOSIS — Z09 Encounter for follow-up examination after completed treatment for conditions other than malignant neoplasm: Secondary | ICD-10-CM

## 2016-08-21 DIAGNOSIS — K5730 Diverticulosis of large intestine without perforation or abscess without bleeding: Secondary | ICD-10-CM | POA: Diagnosis not present

## 2016-08-21 DIAGNOSIS — Z820 Family history of epilepsy and other diseases of the nervous system: Secondary | ICD-10-CM | POA: Diagnosis not present

## 2016-08-21 DIAGNOSIS — D1339 Benign neoplasm of other parts of small intestine: Secondary | ICD-10-CM | POA: Diagnosis not present

## 2016-08-21 DIAGNOSIS — Z9049 Acquired absence of other specified parts of digestive tract: Secondary | ICD-10-CM | POA: Diagnosis not present

## 2016-08-21 DIAGNOSIS — Z833 Family history of diabetes mellitus: Secondary | ICD-10-CM | POA: Diagnosis not present

## 2016-08-21 DIAGNOSIS — K6389 Other specified diseases of intestine: Secondary | ICD-10-CM | POA: Diagnosis not present

## 2016-08-21 DIAGNOSIS — M7989 Other specified soft tissue disorders: Secondary | ICD-10-CM | POA: Diagnosis not present

## 2016-08-21 DIAGNOSIS — E785 Hyperlipidemia, unspecified: Secondary | ICD-10-CM | POA: Diagnosis present

## 2016-08-21 DIAGNOSIS — E876 Hypokalemia: Secondary | ICD-10-CM | POA: Diagnosis present

## 2016-08-21 DIAGNOSIS — K56609 Unspecified intestinal obstruction, unspecified as to partial versus complete obstruction: Secondary | ICD-10-CM

## 2016-08-21 DIAGNOSIS — Z809 Family history of malignant neoplasm, unspecified: Secondary | ICD-10-CM | POA: Diagnosis not present

## 2016-08-21 DIAGNOSIS — Z8719 Personal history of other diseases of the digestive system: Secondary | ICD-10-CM | POA: Diagnosis not present

## 2016-08-21 DIAGNOSIS — R140 Abdominal distension (gaseous): Secondary | ICD-10-CM | POA: Diagnosis not present

## 2016-08-21 DIAGNOSIS — R000 Tachycardia, unspecified: Secondary | ICD-10-CM | POA: Diagnosis not present

## 2016-08-21 LAB — CBC
HCT: 34.9 % — ABNORMAL LOW (ref 39.0–52.0)
HEMOGLOBIN: 11.9 g/dL — AB (ref 13.0–17.0)
MCH: 30.4 pg (ref 26.0–34.0)
MCHC: 34.1 g/dL (ref 30.0–36.0)
MCV: 89.3 fL (ref 78.0–100.0)
PLATELETS: 143 10*3/uL — AB (ref 150–400)
RBC: 3.91 MIL/uL — ABNORMAL LOW (ref 4.22–5.81)
RDW: 14.4 % (ref 11.5–15.5)
WBC: 14.9 10*3/uL — AB (ref 4.0–10.5)

## 2016-08-21 LAB — COMPREHENSIVE METABOLIC PANEL
ALK PHOS: 53 U/L (ref 38–126)
ALT: 19 U/L (ref 17–63)
AST: 29 U/L (ref 15–41)
Albumin: 3.3 g/dL — ABNORMAL LOW (ref 3.5–5.0)
Anion gap: 7 (ref 5–15)
BUN: 18 mg/dL (ref 6–20)
CALCIUM: 8.3 mg/dL — AB (ref 8.9–10.3)
CHLORIDE: 108 mmol/L (ref 101–111)
CO2: 23 mmol/L (ref 22–32)
CREATININE: 1.34 mg/dL — AB (ref 0.61–1.24)
GFR calc Af Amer: 55 mL/min — ABNORMAL LOW (ref >60)
GFR, EST NON AFRICAN AMERICAN: 48 mL/min — AB (ref >60)
Glucose, Bld: 151 mg/dL — ABNORMAL HIGH (ref 65–99)
Potassium: 4.1 mmol/L (ref 3.5–5.1)
SODIUM: 138 mmol/L (ref 135–145)
Total Bilirubin: 0.9 mg/dL (ref 0.3–1.2)
Total Protein: 6.3 g/dL — ABNORMAL LOW (ref 6.5–8.1)

## 2016-08-21 LAB — PROTIME-INR
INR: 1.03
Prothrombin Time: 13.5 seconds (ref 11.4–15.2)

## 2016-08-21 LAB — LACTIC ACID, PLASMA: Lactic Acid, Venous: 1.7 mmol/L (ref 0.5–1.9)

## 2016-08-21 LAB — MRSA PCR SCREENING: MRSA by PCR: NEGATIVE

## 2016-08-21 MED ORDER — ACETAMINOPHEN 325 MG PO TABS: 650.0000 mg | ORAL_TABLET | Freq: Four times a day (QID) | ORAL | Status: DC | PRN

## 2016-08-21 MED ORDER — PANTOPRAZOLE SODIUM 40 MG IV SOLR
40.0000 mg | Freq: Every day | INTRAVENOUS | Status: DC
  Administered 2016-08-22 – 2016-08-24 (×4): 40 mg via INTRAVENOUS
  Filled 2016-08-21 (×4): qty 40

## 2016-08-21 MED ORDER — ONDANSETRON HCL 4 MG PO TABS: 4.0000 mg | ORAL_TABLET | Freq: Four times a day (QID) | ORAL | Status: DC | PRN

## 2016-08-21 MED ORDER — ONDANSETRON HCL 4 MG/2ML IJ SOLN
4.0000 mg | Freq: Four times a day (QID) | INTRAMUSCULAR | Status: DC | PRN
  Administered 2016-08-25 – 2016-08-31 (×2): 4 mg via INTRAVENOUS
  Filled 2016-08-21 (×2): qty 2

## 2016-08-21 MED ORDER — SODIUM CHLORIDE 0.9 % IV SOLN
INTRAVENOUS | Status: AC
  Administered 2016-08-22: 01:00:00 via INTRAVENOUS

## 2016-08-21 MED ORDER — ALLOPURINOL 100 MG PO TABS
100.0000 mg | ORAL_TABLET | Freq: Every day | ORAL | Status: DC
  Administered 2016-08-22 – 2016-09-01 (×8): 100 mg via ORAL
  Filled 2016-08-21 (×8): qty 1

## 2016-08-21 MED ORDER — ACETAMINOPHEN 650 MG RE SUPP: 650.0000 mg | Freq: Four times a day (QID) | RECTAL | Status: DC | PRN

## 2016-08-21 NOTE — H&P (Signed)
History and Physical    Bradley Short NIO:[id number] DOB: 18-May-1932 DOA: 08/21/2016  PCP: Manon Hilding, MD  Patient coming from: Patient was transferred from [facility].  Chief Complaint: Rectal bleeding.  HPI: 34 Bradley Short is a 83 y.o. male with history of atrial fibrillation, gout started experiencing frank rectal bleeding yesterday morning at home. Had 3 episodes which were painless. Patient felt weak and diaphoretic. Denies any chest pain or shortness of breath. Patient went to the ER at [facility]. Hemoglobin over the eye was found to be around 12. Patient was initially in A. fib with RVR which improved with fluids. CT of the abdomen and pelvis done showed nothing acute except for sigmoid diverticulosis. Patient was transferred to [facility] for further management of GI bleeding. Patient follows up with cardiologist at [facility]. On my exam patient is not in distress. On arrival patient had another bout of rectal bleeding with Pilar Plate blood and also melanotic stools. Patient denies taking any NSAIDs. Patient is being admitted for further management of rectal bleeding likely to be from diverticulosis.   ED Course: Patient was a direct admit.  Review of Systems: As per HPI, rest all negative.   Past Medical History:  Diagnosis Date  . Atrial fibrillation (HCC)    paroxysmal, s/p PVI by Dr Rayann Heman 8/12  . Atrial flutter ([place])    s/p CTI ablation  . Hyperlipidemia   . Obstructive sleep apnea (adult) (pediatric)    on CPAP    Past Surgical History:  Procedure Laterality Date  . ATRIAL ABLATION SURGERY     s/p CTI and PVI ablations by Dr Rayann Heman 8/12  . CARDIAC CATHETERIZATION     nonobstructive CAD 2000  . melanoma (other)     resected from his right shoulder     reports that he has never smoked. He has never used smokeless tobacco. He reports that he does not drink alcohol or use drugs.  Allergies  Allergen Reactions  .  Diltiazem Hcl     REACTION: Looked like burned all over  . Lisinopril     REACTION: Looked like burned all over  . Metoprolol Succinate     REACTION: Looked like burned all over  . Propafenone Hcl     REACTION: Looked like burned all over    Family History  Problem Relation Age of Onset  . Prostate cancer Father   . Seizures Other   . Cancer Other   . Diabetes Other   . Heart disease Other     Prior to Admission medications   Medication Sig Start Date End Date Taking? Authorizing Provider  allopurinol (ZYLOPRIM) 100 MG tablet Take 1 tablet by mouth daily. 06/22/16  Yes [provider]  Omega-3 Fatty Acids (FISH OIL PO) Take 1,600 mg by mouth daily.   Yes [provider]  rivaroxaban (XARELTO) 20 MG TABS tablet Take 1 tablet by mouth once daily with supper 06/23/16  Yes Allred, Jeneen Rinks, MD    Physical Exam: Vitals:   08/21/16 2200 08/21/16 2314  BP: (!) 121/54   Pulse: 84   Resp: 19   Temp: 98.4 F (36.9 C) 98.2 F (36.8 C)  TempSrc: Oral Oral  SpO2: 99%   Weight: 93 kg (205 lb 0.4 oz)   Height: 5\' 11"  (1.803 m)       Constitutional: Moderately built and nourished. Vitals:   08/21/16 2200 08/21/16 2314  BP: (!) 121/54   Pulse: 84   Resp:  19   Temp: 98.4 F (36.9 C) 98.2 F (36.8 C)  TempSrc: Oral Oral  SpO2: 99%   Weight: 93 kg (205 lb 0.4 oz)   Height: 5\' 11"  (1.803 m)    Eyes: Anicteric no pallor. ENMT: No discharge from the ears eyes nose or mouth. Neck: No mass felt. No neck rigidity. Respiratory: No rhonchi or crepitations. Cardiovascular: S1 and S2 heard no murmurs appreciated. Abdomen: Soft nontender bowel sounds present. Musculoskeletal: No edema. No joint effusion. Skin: No rash. Skin appears warm. Neurologic: Alert awake oriented to time place and person. Moves all extremities. Psychiatric: Appears normal. Normal affect.   Labs on Admission: I have personally reviewed following labs and imaging studies  CBC:  Recent  Labs Lab 08/21/16 2303  WBC 14.9*  HGB 11.9*  HCT 34.9*  MCV 89.3  PLT 672*   Basic Metabolic Panel:  Recent Labs Lab 08/21/16 2303  NA 138  K 4.1  CL 108  CO2 23  GLUCOSE 151*  BUN 18  CREATININE 1.34*  CALCIUM 8.3*   GFR: Estimated Creatinine Clearance: 49.5 mL/min (A) (by C-G formula based on SCr of 1.34 mg/dL (H)). Liver Function Tests:  Recent Labs Lab 08/21/16 2303  AST 29  ALT 19  ALKPHOS 53  BILITOT 0.9  PROT 6.3*  ALBUMIN 3.3*   No results for input(s): LIPASE, AMYLASE in the last 168 hours. No results for input(s): AMMONIA in the last 168 hours. Coagulation Profile:  Recent Labs Lab 08/21/16 2303  INR 1.03   Cardiac Enzymes: No results for input(s): CKTOTAL, CKMB, CKMBINDEX, TROPONINI in the last 168 hours. BNP (last 3 results) No results for input(s): PROBNP in the last 8760 hours. HbA1C: No results for input(s): HGBA1C in the last 72 hours. CBG: No results for input(s): GLUCAP in the last 168 hours. Lipid Profile: No results for input(s): CHOL, HDL, LDLCALC, TRIG, CHOLHDL, LDLDIRECT in the last 72 hours. Thyroid Function Tests: No results for input(s): TSH, T4TOTAL, FREET4, T3FREE, THYROIDAB in the last 72 hours. Anemia Panel: No results for input(s): VITAMINB12, FOLATE, FERRITIN, TIBC, IRON, RETICCTPCT in the last 72 hours. Urine analysis:    Component Value Date/Time   COLORURINE YELLOW 10/11/2010 [place] 10/11/2010 1201   LABSPEC 1.022 10/11/2010 1201   PHURINE 6.0 10/11/2010 1201   GLUCOSEU NEGATIVE 10/11/2010 1201   HGBUR SMALL (A) 10/11/2010 1201   BILIRUBINUR NEGATIVE 10/11/2010 1201   KETONESUR NEGATIVE 10/11/2010 1201   PROTEINUR NEGATIVE 10/11/2010 1201   UROBILINOGEN 1.0 10/11/2010 1201   NITRITE NEGATIVE 10/11/2010 1201   LEUKOCYTESUR NEGATIVE 10/11/2010 1201   Sepsis Labs: @LABRCNTIP (procalcitonin:4,lacticidven:4) ) Recent Results (from the past 240 hour(s))  MRSA PCR Screening     Status: None    Collection Time: 08/21/16 10:20 PM  Result Value Ref Range Status   MRSA by PCR NEGATIVE NEGATIVE Final    Comment:        The GeneXpert MRSA Assay (FDA approved for NASAL specimens only), is one component of a comprehensive MRSA colonization surveillance program. It is not intended to diagnose MRSA infection nor to guide or monitor treatment for MRSA infections.      Radiological Exams on Admission: No results found.  EKG: Independently reviewed. EKG done at [facility] shows A. fib rate control.  Assessment/Plan Principal Problem:   Acute GI bleeding Active Problems:   ATRIAL FIBRILLATION   Acute blood loss anemia    1. Acute GI bleeding likely from lower GI bleed possibly from  diverticulosis - CT scan done in the ER showed sigmoid diverticulosis. Patient also had some melanotic appearing stools while patient had a bowel movement over here. Will keep patient nothing by mouth and check serial CBCs. Consult gastroenterologist in a.m. Protonix IV. Hold xarelto. 2. History of A. fib presently rate controlled. Holding xarelto due to GI bleed. 3. History of gout on allopurinol. 4. Acute blood loss anemia - follow CBC. Transfuse once hemoglobin less than 7 or if patient gets hypotensive.   DVT prophylaxis: SCDs. Code Status: Full code.  Family Communication: Discussed with wife.  Disposition Plan: Home.  Consults called: None.  Admission status: Inpatient.    Rise Patience MD [facility] Pager [phone].  If 7PM-7AM, please contact night-coverage [email or web address] Password [facility]  08/21/2016, 11:50 PM

## 2016-08-22 DIAGNOSIS — D696 Thrombocytopenia, unspecified: Secondary | ICD-10-CM

## 2016-08-22 DIAGNOSIS — N183 Chronic kidney disease, stage 3 unspecified: Secondary | ICD-10-CM

## 2016-08-22 DIAGNOSIS — E7800 Pure hypercholesterolemia, unspecified: Secondary | ICD-10-CM

## 2016-08-22 DIAGNOSIS — M109 Gout, unspecified: Secondary | ICD-10-CM

## 2016-08-22 DIAGNOSIS — D72829 Elevated white blood cell count, unspecified: Secondary | ICD-10-CM

## 2016-08-22 DIAGNOSIS — R55 Syncope and collapse: Secondary | ICD-10-CM

## 2016-08-22 LAB — CBC
HCT: 32.8 % — ABNORMAL LOW (ref 39.0–52.0)
HCT: 31.8 % — ABNORMAL LOW (ref 39.0–52.0)
HEMOGLOBIN: 10.8 g/dL — AB (ref 13.0–17.0)
Hemoglobin: 11.2 g/dL — ABNORMAL LOW (ref 13.0–17.0)
MCH: 30.4 pg (ref 26.0–34.0)
MCH: 30.5 pg (ref 26.0–34.0)
MCHC: 34.1 g/dL (ref 30.0–36.0)
MCHC: 34 g/dL (ref 30.0–36.0)
MCV: 89.1 fL (ref 78.0–100.0)
MCV: 89.8 fL (ref 78.0–100.0)
PLATELETS: 148 10*3/uL — AB (ref 150–400)
Platelets: 139 10*3/uL — ABNORMAL LOW (ref 150–400)
RBC: 3.68 MIL/uL — AB (ref 4.22–5.81)
RBC: 3.54 MIL/uL — ABNORMAL LOW (ref 4.22–5.81)
RDW: 14.5 % (ref 11.5–15.5)
RDW: 14.6 % (ref 11.5–15.5)
WBC: 14.1 10*3/uL — AB (ref 4.0–10.5)
WBC: 12.5 10*3/uL — ABNORMAL HIGH (ref 4.0–10.5)

## 2016-08-22 LAB — TYPE AND SCREEN
ABO/RH(D): O NEG
Antibody Screen: NEGATIVE

## 2016-08-22 LAB — BASIC METABOLIC PANEL
Anion gap: 7 (ref 5–15)
BUN: 19 mg/dL (ref 6–20)
CALCIUM: 8.4 mg/dL — AB (ref 8.9–10.3)
CHLORIDE: 112 mmol/L — AB (ref 101–111)
CO2: 22 mmol/L (ref 22–32)
CREATININE: 1.35 mg/dL — AB (ref 0.61–1.24)
GFR calc non Af Amer: 47 mL/min — ABNORMAL LOW (ref >60)
GFR, EST AFRICAN AMERICAN: 55 mL/min — AB (ref >60)
Glucose, Bld: 124 mg/dL — ABNORMAL HIGH (ref 65–99)
Potassium: 3.9 mmol/L (ref 3.5–5.1)
Sodium: 141 mmol/L (ref 135–145)

## 2016-08-22 LAB — ABO/RH: ABO/RH(D): O NEG

## 2016-08-22 LAB — GLUCOSE, CAPILLARY: GLUCOSE-CAPILLARY: 114 mg/dL — AB (ref 65–99)

## 2016-08-22 NOTE — Consult Note (Signed)
Referring Provider:  [place] Primary Care Physician:  Manon Hilding, MD Primary Gastroenterologist:  Althia Forts  Reason for Consultation:  Rectal bleeding  HPI: Bradley Short is a 83 y.o. male with past medical history of atrial fibrillation currently on Xarelto was transferred to [place] from [facility] for further evaluation of rectal bleeding.   Patient seen and examined. Wife at bedside. According to patient, he started noticing bright red blood per rectum Monday morning. After having 3 episodes he made an appointment with his primary care physician. While he was checking out, he had dizziness and subsequently syncopal episode. Patient was then sent to ER and was transferred here. He denied any abdominal pain. He was complaining of nausea but denied any vomiting. Last bloody bowel movement was around an hour ago. No previous history of GI bleed.  No previous colonoscopy. Brother was diagnosed with colon cancer in his late 47s.    Past Medical History:  Diagnosis Date  . Atrial fibrillation (HCC)    paroxysmal, s/p PVI by Dr Rayann Heman 8/12  . Atrial flutter ([place])    s/p CTI ablation  . Hyperlipidemia   . Obstructive sleep apnea (adult) (pediatric)    on CPAP    Past Surgical History:  Procedure Laterality Date  . ATRIAL ABLATION SURGERY     s/p CTI and PVI ablations by Dr Rayann Heman 8/12  . CARDIAC CATHETERIZATION     nonobstructive CAD 2000  . melanoma (other)     resected from his right shoulder    Prior to Admission medications   Medication Sig Start Date End Date Taking? Authorizing Provider  allopurinol (ZYLOPRIM) 100 MG tablet Take 1 tablet by mouth daily. 06/22/16  Yes [provider]  Omega-3 Fatty Acids (FISH OIL PO) Take 1,600 mg by mouth daily.   Yes [provider]  rivaroxaban (XARELTO) 20 MG TABS tablet Take 1 tablet by mouth once daily with supper 06/23/16  Yes Allred, Jeneen Rinks, MD    Scheduled Meds: . allopurinol  100 mg Oral Daily   . pantoprazole (PROTONIX) IV  40 mg Intravenous QHS   Continuous Infusions: . sodium chloride 50 mL/hr at 08/22/16 0038   PRN Meds:.acetaminophen **OR** acetaminophen, ondansetron **OR** ondansetron (ZOFRAN) IV  Allergies as of 08/21/2016 - Review Complete 08/21/2016  Allergen Reaction Noted  . Diltiazem hcl    . Lisinopril    . Metoprolol succinate    . Propafenone hcl      Family History  Problem Relation Age of Onset  . Prostate cancer Father   . Seizures Other   . Cancer Other   . Diabetes Other   . Heart disease Other     Social History   Social History  . Marital status: Married    Spouse name: N/A  . Number of children: N/A  . Years of education: N/A   Occupational History  . Not on file.   Social History Main Topics  . Smoking status: Never Smoker  . Smokeless tobacco: Never Used  . Alcohol use No  . Drug use: No  . Sexual activity: Not on file   Other Topics Concern  . Not on file   Social History Narrative   Lives in [place].  Retired Warden/ranger    Review of Systems: Review of Systems  Constitutional: Positive for malaise/fatigue. Negative for chills and fever.  HENT: Negative for ear discharge, hearing loss and tinnitus.   Eyes: Positive for blurred vision. Negative for double vision.  Respiratory: Negative  for cough, hemoptysis and sputum production.   Cardiovascular: Negative for chest pain and palpitations.  Gastrointestinal: Positive for blood in stool and nausea. Negative for abdominal pain, constipation, diarrhea, heartburn and vomiting.  Genitourinary: Negative for dysuria and urgency.  Musculoskeletal: Positive for myalgias.  Skin: Negative for rash.  Neurological: Positive for loss of consciousness and weakness. Negative for speech change and seizures.  Endo/Heme/Allergies: Does not bruise/bleed easily.  Psychiatric/Behavioral: Negative for hallucinations and suicidal ideas.    Physical Exam: Vital signs: Vitals:   08/22/16 0600  08/22/16 0800  BP: (!) 115/45   Pulse: 68   Resp: 14   Temp:  98.2 F (36.8 C)   Last BM Date: 08/21/16 Physical Exam  Constitutional: He is oriented to person, place, and time. He appears well-developed and well-nourished. No distress.  HENT:  Head: Normocephalic and atraumatic.  Mouth/Throat: Oropharynx is clear and moist. No oropharyngeal exudate.  Eyes: EOM are normal. No scleral icterus.  Neck: Normal range of motion. Neck supple. No thyromegaly present.  Cardiovascular: Normal rate, regular rhythm and normal heart sounds.   Pulmonary/Chest: Effort normal and breath sounds normal. No respiratory distress.  Abdominal: Soft. Bowel sounds are normal. He exhibits no distension. There is no tenderness. There is no rebound and no guarding.  Musculoskeletal: Normal range of motion. He exhibits no edema.  Neurological: He is alert and oriented to person, place, and time.  Skin: Skin is warm. No erythema.  Psychiatric: He has a normal mood and affect. His behavior is normal. Thought content normal.  Nursing note and vitals reviewed.   GI:  Lab Results:  Recent Labs  08/21/16 2303 08/22/16 0302  WBC 14.9* 14.1*  HGB 11.9* 11.2*  HCT 34.9* 32.8*  PLT 143* 148*   BMET  Recent Labs  08/21/16 2303 08/22/16 0302  NA 138 141  K 4.1 3.9  CL 108 112*  CO2 23 22  GLUCOSE 151* 124*  BUN 18 19  CREATININE 1.34* 1.35*  CALCIUM 8.3* 8.4*   LFT  Recent Labs  08/21/16 2303  PROT 6.3*  ALBUMIN 3.3*  AST 29  ALT 19  ALKPHOS 53  BILITOT 0.9   PT/INR  Recent Labs  08/21/16 2303  LABPROT 13.5  INR 1.03     Studies/Results: No results found.  Impression/Plan: - BRBPR while on Xarelto. - Atrial fibrillation on Xarelto - last dose 08/20/2016 night - Chronic kidney disease -Family History of colon cancer in brother in his late 44s - Syncopal episode. Resolved  Recommendations ------------------------ - Patient's hemoglobin is 11.2. Hemodynamically stable at  this time. Complaining of weakness. His bleeding seems to be improving - Continue to hold Xarelto. Patient with mild kidney insufficiency which will result in decreased clearance of Xarelto. We'll plan for colonoscopy tentatively on Thursday. Full liquid diet today. Monitor hemoglobin. GI will follow.    LOS: 1 day   Otis Brace  MD, FACP 08/22/2016, 8:43 AM  Pager [phone] If no answer or after 5 PM call [phone]

## 2016-08-22 NOTE — Progress Notes (Signed)
PROGRESS NOTE    Bradley Short  OEU:[id number] DOB: 1932/10/10 DOA: 08/21/2016 PCP: Manon Hilding, MD   Brief Narrative:  Bradley Short is a 83 y.o. male with history of atrial fibrillation, gout, and other comorbidities who started experiencing frank rectal bleeding yesterday morning at home. Had 3 episodes which were painless. Patient felt weak and diaphoretic. Denies any chest pain or shortness of breath. Patient went to the ER at [facility]. Hemoglobin over the eye was found to be around 12. Patient was initially in A. fib with RVR which improved with fluids. CT of the abdomen and pelvis done showed nothing acute except for sigmoid diverticulosis. Patient was transferred to [facility] for further management of GI bleeding. Patient follows up with Cardiologist at [facility]. On my exam patient is not in distress. On arrival patient had another bout of rectal bleeding with Pilar Plate blood and also melanotic stools. Patient denies taking any NSAIDs. Patient is being admitted for further management of rectal bleeding likely to be from diverticulosis. Gastroenterology was consulted and Dr. Alessandra Bevels recommending Colonoscopy on Thursday 08/23/16 and continuing Full Liquid Diet for now.   Assessment & Plan:   Principal Problem:   Acute GI bleeding Active Problems:   ATRIAL FIBRILLATION   Acute blood loss anemia  Acute GIB suspect Lower GIB likely from Diverticulosis -Admitted as Inpatient in SDU -CT Abdomen and Pelvis showed -Had Melanotic stools while patient had a bowel movement in the hospital here -Changed NPO to Full Liquid Diet per Gasteroenterology -C/w Pantoprazole 40 mg IV qHS Daily at bedtime  -Hold Xarelto 20 mg po qHS -Consulted Gastroenterology Dr. Heather Roberts and planning for colonoscopy tentatively on [place]  Paroxysmal Atrial Fibrillation -Rate Controlled and not on any Beta Blockers or Calcium Channel Blockers -C/w Telemetry    Dizziness and  Syncope -Likely Vasovagal Event from GIB -Rehydrate with NS at 50 mL/hr -[facility] in AM -Check ECHOCardiogram in AM -Will need PT/OT Evaluation   Hyperlipidemia -Check Lipid Panel in AM -Hold Patient's Omega 3 Fish Oil  Gout -C/w Allopurinol 100 mg po Daily  Acute Blood Loss Anemia -Patient's Hb/Hct went from 11.9/34.9 -> 11.2/32.8 -> 10.8/31.8 -Continue to Monitor S/Sx of Bleeding -Repeat CBC in AM and H/H this Evening   Leukocytosis -Likely Reactive in the setting of GIB -Lactic Acid Level was 1.7 -Patient's WBC went from 14.9 -> 14.1 -> 12.5 -Repeat CBC in AM and Continue to Monitor for S/Sx of Infection  Thrombocytopenia -Patient's Platelet Count went from 143 -> 148 -> 139 -Continue to Monitor CBC in AM  CKD Stage 3 -Prior Baseline Cr in our system shows Cr ranging from 1.1-1.4 -BUN/Cr went from 18/1.34 -> 19/1.35 -Continue to Monitor and repeat CM in AM  DVT prophylaxis: SCDs Code Status: FULL CODE Family Communication: Discussed with wife at Bedside Disposition Plan: Remain Inpatient at this time  Consultants:   Gastroenterology Dr. Otis Brace   Procedures: Colonoscopy tentatively scheduled for 08/23/16  Antimicrobials:  Anti-infectives    None     Subjective: Seen and examined at bedside and was still bleeding slightly. Passed out yesterday and wife states he was out for about 2-3 minutes. No nausea or vomiting but stated he was having a lot of abdominal discomfort yesterday. No other concerns or complaints.    Objective: Vitals:   08/22/16 0200 08/22/16 0339 08/22/16 0400 08/22/16 0600  BP: (!) 101/34  (!) 105/50 (!) 115/45  Pulse: 73  69 68  Resp: 16  11 14  Temp:  98.3 F (36.8 C)    TempSrc:  Oral    SpO2: 96%  95% 98%  Weight:      Height:        Intake/Output Summary (Last 24 hours) at 08/22/16 0745 Last data filed at 08/22/16 0600  Gross per 24 hour  Intake           268.33 ml  Output                0 ml  Net            268.33 ml   Filed Weights   08/21/16 2200  Weight: 93 kg (205 lb 0.4 oz)   Examination: Physical Exam:  Constitutional: NAD and appears calm and comfortable Eyes: Lids and conjunctivae normal, sclerae anicteric  ENMT: External Ears, Nose appear normal. Grossly normal hearing.  Neck: Appears normal, supple, no cervical masses, normal ROM, no appreciable thyromegaly Respiratory: Clear to auscultation bilaterally, no wheezing, rales, rhonchi or crackles. Normal respiratory effort and patient is not tachypenic. No accessory muscle use.  Cardiovascular: Irregularly Irregular, no murmurs / rubs / gallops. S1 and S2 auscultated. No extremity edema.  Abdomen: Soft, mildly tender, non-distended. No masses palpated. No appreciable hepatosplenomegaly. Bowel sounds positive x4.  GU: Deferred. Musculoskeletal: No clubbing / cyanosis of digits/nails. No joint deformity upper and lower extremities. Skin: No rashes, lesions, ulcers. No induration; Warm and dry.  Neurologic: CN 2-12 grossly intact with no focal deficits. Romberg sign cerebellar reflexes not assessed.  Psychiatric: Normal judgment and insight. Alert and oriented x 3. Normal mood and appropriate affect.   Data Reviewed: I have personally reviewed following labs and imaging studies  CBC:  Recent Labs Lab 08/21/16 2303 08/22/16 0302  WBC 14.9* 14.1*  HGB 11.9* 11.2*  HCT 34.9* 32.8*  MCV 89.3 89.1  PLT 143* 175*   Basic Metabolic Panel:  Recent Labs Lab 08/21/16 2303 08/22/16 0302  NA 138 141  K 4.1 3.9  CL 108 112*  CO2 23 22  GLUCOSE 151* 124*  BUN 18 19  CREATININE 1.34* 1.35*  CALCIUM 8.3* 8.4*   GFR: Estimated Creatinine Clearance: 49.2 mL/min (A) (by C-G formula based on SCr of 1.35 mg/dL (H)). Liver Function Tests:  Recent Labs Lab 08/21/16 2303  AST 29  ALT 19  ALKPHOS 53  BILITOT 0.9  PROT 6.3*  ALBUMIN 3.3*   No results for input(s): LIPASE, AMYLASE in the last 168 hours. No results for  input(s): AMMONIA in the last 168 hours. Coagulation Profile:  Recent Labs Lab 08/21/16 2303  INR 1.03   Cardiac Enzymes: No results for input(s): CKTOTAL, CKMB, CKMBINDEX, TROPONINI in the last 168 hours. BNP (last 3 results) No results for input(s): PROBNP in the last 8760 hours. HbA1C: No results for input(s): HGBA1C in the last 72 hours. CBG: No results for input(s): GLUCAP in the last 168 hours. Lipid Profile: No results for input(s): CHOL, HDL, LDLCALC, TRIG, CHOLHDL, LDLDIRECT in the last 72 hours. Thyroid Function Tests: No results for input(s): TSH, T4TOTAL, FREET4, T3FREE, THYROIDAB in the last 72 hours. Anemia Panel: No results for input(s): VITAMINB12, FOLATE, FERRITIN, TIBC, IRON, RETICCTPCT in the last 72 hours. Sepsis Labs:  Recent Labs Lab 08/21/16 2303  LATICACIDVEN 1.7    Recent Results (from the past 240 hour(s))  MRSA PCR Screening     Status: None   Collection Time: 08/21/16 10:20 PM  Result Value Ref Range Status   MRSA by PCR NEGATIVE NEGATIVE  Final    Comment:        The GeneXpert MRSA Assay (FDA approved for NASAL specimens only), is one component of a comprehensive MRSA colonization surveillance program. It is not intended to diagnose MRSA infection nor to guide or monitor treatment for MRSA infections.     Radiology Studies: No results found.  Scheduled Meds: . allopurinol  100 mg Oral Daily  . pantoprazole (PROTONIX) IV  40 mg Intravenous QHS   Continuous Infusions: . sodium chloride 50 mL/hr at 08/22/16 0038    LOS: 1 day   Kerney Elbe, DO [facility] Pager [phone]  If 7PM-7AM, please contact night-coverage [email or web address] Password [facility] 08/22/2016, 7:45 AM

## 2016-08-23 ENCOUNTER — Inpatient Hospital Stay (HOSPITAL_COMMUNITY): Payer: Medicare Other

## 2016-08-23 DIAGNOSIS — R55 Syncope and collapse: Secondary | ICD-10-CM

## 2016-08-23 LAB — GLUCOSE, CAPILLARY
GLUCOSE-CAPILLARY: 107 mg/dL — AB (ref 65–99)
GLUCOSE-CAPILLARY: 109 mg/dL — AB (ref 65–99)
GLUCOSE-CAPILLARY: 110 mg/dL — AB (ref 65–99)
GLUCOSE-CAPILLARY: 114 mg/dL — AB (ref 65–99)

## 2016-08-23 LAB — COMPREHENSIVE METABOLIC PANEL
ALT: 18 U/L (ref 17–63)
ANION GAP: 9 (ref 5–15)
AST: 31 U/L (ref 15–41)
Albumin: 3.5 g/dL (ref 3.5–5.0)
Alkaline Phosphatase: 56 U/L (ref 38–126)
BUN: 17 mg/dL (ref 6–20)
CHLORIDE: 109 mmol/L (ref 101–111)
CO2: 22 mmol/L (ref 22–32)
Calcium: 8.4 mg/dL — ABNORMAL LOW (ref 8.9–10.3)
Creatinine, Ser: 1.48 mg/dL — ABNORMAL HIGH (ref 0.61–1.24)
GFR calc non Af Amer: 42 mL/min — ABNORMAL LOW (ref >60)
GFR, EST AFRICAN AMERICAN: 49 mL/min — AB (ref >60)
Glucose, Bld: 172 mg/dL — ABNORMAL HIGH (ref 65–99)
Potassium: 3.7 mmol/L (ref 3.5–5.1)
SODIUM: 140 mmol/L (ref 135–145)
Total Bilirubin: 0.5 mg/dL (ref 0.3–1.2)
Total Protein: 6.3 g/dL — ABNORMAL LOW (ref 6.5–8.1)

## 2016-08-23 LAB — CBC WITH DIFFERENTIAL/PLATELET
Basophils Absolute: 0 10*3/uL (ref 0.0–0.1)
Basophils Relative: 0 %
EOS ABS: 0.2 10*3/uL (ref 0.0–0.7)
EOS PCT: 2 %
HCT: 33.4 % — ABNORMAL LOW (ref 39.0–52.0)
Hemoglobin: 11.1 g/dL — ABNORMAL LOW (ref 13.0–17.0)
LYMPHS ABS: 1.4 10*3/uL (ref 0.7–4.0)
Lymphocytes Relative: 13 %
MCH: 29.7 pg (ref 26.0–34.0)
MCHC: 33.2 g/dL (ref 30.0–36.0)
MCV: 89.3 fL (ref 78.0–100.0)
MONOS PCT: 3 %
Monocytes Absolute: 0.3 10*3/uL (ref 0.1–1.0)
Neutro Abs: 9.1 10*3/uL — ABNORMAL HIGH (ref 1.7–7.7)
Neutrophils Relative %: 82 %
PLATELETS: 134 10*3/uL — AB (ref 150–400)
RBC: 3.74 MIL/uL — ABNORMAL LOW (ref 4.22–5.81)
RDW: 14.5 % (ref 11.5–15.5)
WBC: 11 10*3/uL — AB (ref 4.0–10.5)

## 2016-08-23 LAB — CBC
HCT: 31.6 % — ABNORMAL LOW (ref 39.0–52.0)
HEMOGLOBIN: 10.6 g/dL — AB (ref 13.0–17.0)
MCH: 29.9 pg (ref 26.0–34.0)
MCHC: 33.5 g/dL (ref 30.0–36.0)
MCV: 89 fL (ref 78.0–100.0)
PLATELETS: 135 10*3/uL — AB (ref 150–400)
RBC: 3.55 MIL/uL — ABNORMAL LOW (ref 4.22–5.81)
RDW: 14.5 % (ref 11.5–15.5)
WBC: 11.1 10*3/uL — ABNORMAL HIGH (ref 4.0–10.5)

## 2016-08-23 LAB — ECHOCARDIOGRAM COMPLETE
Height: 71 in
WEIGHTICAEL: 3280.44 [oz_av]

## 2016-08-23 LAB — MAGNESIUM: Magnesium: 2.1 mg/dL (ref 1.7–2.4)

## 2016-08-23 LAB — PHOSPHORUS: PHOSPHORUS: 1.9 mg/dL — AB (ref 2.5–4.6)

## 2016-08-23 MED ORDER — PEG 3350-KCL-NA BICARB-NACL 420 G PO SOLR
4000.0000 mL | Freq: Once | ORAL | Status: AC
  Administered 2016-08-23: 4000 mL via ORAL
  Filled 2016-08-23: qty 4000

## 2016-08-23 MED ORDER — POTASSIUM PHOSPHATES 15 MMOLE/5ML IV SOLN
10.0000 mmol | Freq: Once | INTRAVENOUS | Status: AC
  Administered 2016-08-23: 10 mmol via INTRAVENOUS
  Filled 2016-08-23: qty 3.33

## 2016-08-23 MED ORDER — SODIUM CHLORIDE 0.9 % IV SOLN
INTRAVENOUS | Status: AC
  Administered 2016-08-23 – 2016-08-24 (×2): via INTRAVENOUS

## 2016-08-23 MED ORDER — LORAZEPAM 0.5 MG PO TABS
0.5000 mg | ORAL_TABLET | Freq: Two times a day (BID) | ORAL | Status: DC | PRN
  Administered 2016-08-28 – 2016-08-30 (×2): 0.5 mg via ORAL
  Filled 2016-08-23 (×2): qty 1

## 2016-08-23 NOTE — Progress Notes (Signed)
Pharmacy: phosphorus replacement  Patient's an 83 y.o M admitted to [facility] with rectal bleeding.  Pharmacy to assist with phosphorous replacement.  Today, 08/23/2016: - phosphorus level low at 1.9 - K 3.7, Na 140 - scr trending up 1.48 - Calcium 8.4, Albumin wnl  Plan: - potassium phosphate 10 mmol IV x1 over 6 hours  today  - check phosphorus level with AM labs on 6/28 and give additional doses if needed  Dia Sitter, PharmD, BCPS 08/23/2016 12:59 PM

## 2016-08-23 NOTE — Progress Notes (Signed)
PROGRESS NOTE   Bradley Short  QPY:[id number]    DOB: 09/13/1932    DOA: 08/21/2016  PCP: Manon Hilding, MD   I have briefly reviewed patients previous medical records in [place].  Brief Narrative:  83 year old male with PMH of PAF on Xarelto, HLD, OSA on CPAP, gout, presented with multiple episodes of painless BRBPR, weakness, diaphoresis, went to [facility] ED where hemoglobin around 12 g, initially A. fib with RVR which improved with IV fluids, CT abdomen and pelvis showed sigmoid diverticulosis without acute findings, transferred to [facility] stepdown unit. Hemodynamically and hemoglobin stable. [facility] consulted and plan colonoscopy 08/24/16.  Assessment & Plan:   Principal Problem:   Acute GI bleeding Active Problems:   Pure hypercholesterolemia   ATRIAL FIBRILLATION   Acute blood loss anemia   Gout   Leukocytosis   Thrombocytopenia (HCC)   CKD (chronic kidney disease), stage III   Syncope, vasovagal   1. Acute GI bleed, suspect LGIB:  suspect diverticular bleed versus AVM versus hemorrhoids while on Xarelto (last dose 08/20/16). Ongoing blood in stools. However hemodynamically and hemoglobin stable. [facility] follow-up appreciated, discussed with Dr. Alessandra Bevels: Plans for colonoscopy 6/28. Continue Protonix 40 mg IV daily at bedtime. 2. Paroxysmal atrial fibrillation: Currently in sinus rhythm. Not on rate control medications PTA. Xarelto on hold. 3. Acute blood loss anemia: Secondary to GI bleed. Hemoglobin has remained stable in the low 11 g range suggesting mild bleed. Monitor CBCs closely and transfuse if hemoglobin <8 g per DL. 4. Syncope: Suspect vasovagal due to acute GI bleed. Telemetry without significant arrhythmias as cause. 2-D echo: LVEF 60-65 percent. Moderate diastolic dysfunction. No significant valvular abnormalities. IV normal saline hydration. 5. Hyperlipidemia: Holding by mouth meds. 6. Gout: No acute flare. Continue  allopurinol. 7. Stage III chronic kidney disease: Baseline creatinine not known. Presented with creatinine of 1.34 which has gone up slightly to 1.48. IV fluids and follow BMP in a.m. 8. Thrombocytopenia: Secondary to acute blood loss. follow CBCs. 9. Leukocytosis: Likely stress response. Improved.   DVT prophylaxis: SCDs Code Status: Full Family Communication: Discussed in detail with patient's spouse at bedside. Disposition: Continue management in stepdown unit. DC home when medically improved and stable.   Consultants:  [facility]   Procedures:  None  Antimicrobials:  None    Subjective: Reports 5 bloody BMs over the last 24 hours. Dizziness when he sits up or stands up. Denies pain. Nausea without vomiting. I personally witnessed dark red watery BM in bedside commode.  ROS: No chest pain or dyspnea reported.  Objective:  Vitals:   08/23/16 0605 08/23/16 0800 08/23/16 1000 08/23/16 1130  BP: (!) 143/57  (!) 130/44   Pulse: 61  83   Resp: 17  19   Temp:  97.5 F (36.4 C)  98 F (36.7 C)  TempSrc:  Oral  Axillary  SpO2: 99%  99%   Weight:      Height:        Examination:  General exam: Pleasant elderly male lying comfortably supine in bed. Respiratory system: Clear to auscultation. Respiratory effort normal. Cardiovascular system: S1 & S2 heard, RRR. No JVD, murmurs, rubs, gallops or clicks. No pedal edema. Telemetry: Sinus rhythm with first-degree AV block. Gastrointestinal system: Abdomen is nondistended, soft and nontender. No organomegaly or masses felt. Normal bowel sounds heard. Central nervous system: Alert and oriented. No focal neurological deficits. Extremities: Symmetric 5 x 5 power. Skin: No rashes, lesions or ulcers  Psychiatry: Judgement and insight appear normal. Mood & affect appropriate.     Data Reviewed: I have personally reviewed following labs and imaging studies  CBC:  Recent Labs Lab 08/21/16 2303 08/22/16 0302 08/22/16 0915  08/23/16 0916  WBC 14.9* 14.1* 12.5* 11.0*  NEUTROABS  --   --   --  9.1*  HGB 11.9* 11.2* 10.8* 11.1*  HCT 34.9* 32.8* 31.8* 33.4*  MCV 89.3 89.1 89.8 89.3  PLT 143* 148* 139* 435*   Basic Metabolic Panel:  Recent Labs Lab 08/21/16 2303 08/22/16 0302 08/23/16 0916  NA 138 141 140  K 4.1 3.9 3.7  CL 108 112* 109  CO2 23 22 22   GLUCOSE 151* 124* 172*  BUN 18 19 17   CREATININE 1.34* 1.35* 1.48*  CALCIUM 8.3* 8.4* 8.4*  MG  --   --  2.1  PHOS  --   --  1.9*   Liver Function Tests:  Recent Labs Lab 08/21/16 2303 08/23/16 0916  AST 29 31  ALT 19 18  ALKPHOS 53 56  BILITOT 0.9 0.5  PROT 6.3* 6.3*  ALBUMIN 3.3* 3.5   Coagulation Profile:  Recent Labs Lab 08/21/16 2303  INR 1.03   CBG:  Recent Labs Lab 08/22/16 1725 08/23/16 0042 08/23/16 0737  GLUCAP 114* 114* 109*    Recent Results (from the past 240 hour(s))  MRSA PCR Screening     Status: None   Collection Time: 08/21/16 10:20 PM  Result Value Ref Range Status   MRSA by PCR NEGATIVE NEGATIVE Final    Comment:        The GeneXpert MRSA Assay (FDA approved for NASAL specimens only), is one component of a comprehensive MRSA colonization surveillance program. It is not intended to diagnose MRSA infection nor to guide or monitor treatment for MRSA infections.          Radiology Studies: No results found.      Scheduled Meds: . allopurinol  100 mg Oral Daily  . pantoprazole (PROTONIX) IV  40 mg Intravenous QHS   Continuous Infusions: . sodium chloride 100 mL/hr at 08/23/16 1100     LOS: 2 days     Torry Istre, MD, FACP, FHM. [facility] Pager [phone]  If 7PM-7AM, please contact night-coverage [email or web address] Password [facility] 08/23/2016, 12:04 PM

## 2016-08-23 NOTE — Progress Notes (Addendum)
[facility] Progress Note  Bradley Short 83 y.o. 1933/01/23  CC:  Rectal bleeding   Subjective: Patient continues to have  bright red blood and maroon-colored stool per rectum. Patient is complaining of dizziness. Denied abdominal pain, vomiting. Complaining of nausea  ROS : Negative for chest pain and shortness of breath.   Objective: Vital signs in last 24 hours: Vitals:   08/23/16 0605 08/23/16 0800  BP: (!) 143/57   Pulse: 61   Resp: 17   Temp:  97.5 F (36.4 C)    Physical Exam:  General:  Alert, cooperative, no distress, appears stated age  Head:  Normocephalic, without obvious abnormality, atraumatic  Eyes:  , EOM's intact,   Lungs:   Clear to auscultation bilaterally, respirations unlabored  Heart:  Regular rate and rhythm, S1, S2 normal  Abdomen:   Soft, non-tender, bowel sounds active all four quadrants,  no masses,   Extremities: Extremities normal, atraumatic, no  edema       Lab Results:  Recent Labs  08/21/16 2303 08/22/16 0302  NA 138 141  K 4.1 3.9  CL 108 112*  CO2 23 22  GLUCOSE 151* 124*  BUN 18 19  CREATININE 1.34* 1.35*  CALCIUM 8.3* 8.4*    Recent Labs  08/21/16 2303  AST 29  ALT 19  ALKPHOS 53  BILITOT 0.9  PROT 6.3*  ALBUMIN 3.3*    Recent Labs  08/22/16 0302 08/22/16 0915  WBC 14.1* 12.5*  HGB 11.2* 10.8*  HCT 32.8* 31.8*  MCV 89.1 89.8  PLT 148* 139*    Recent Labs  08/21/16 2303  LABPROT 13.5  INR 1.03      Assessment/Plan: - BRBPR while on Xarelto. Differential diagnosis would be diverticular bleed versus AVM versus hemorrhoids - Atrial fibrillation on Xarelto - last dose 08/20/2016 night - Chronic kidney disease -Family History of colon cancer in brother in his late 103s - Syncopal episode. Resolved  Recommendations ------------------------ - Colonoscopy tomorrow for further evaluation. Risk benefits alternatives discussed with the patient. -  If not able to find bleeding source during  colonoscopy or if patient starts having worsening rectal bleeding today consider bleeding scan. Monitor H&H. Transfuse to keep hemoglobin around 8. Nothing by mouth past midnight. GI will follow.   Otis Brace MD, [place] 08/23/2016, 8:28 AM  Pager [phone]  If no answer or after 5 PM call [phone]

## 2016-08-23 NOTE — Progress Notes (Signed)
Notified Dr. Algis Liming notified of Hgb & Hct. 10.6  & 31.6

## 2016-08-23 NOTE — Progress Notes (Signed)
Notified Dr. Algis Liming notified of Hgb & Hct. 11.1 & 33.6

## 2016-08-23 NOTE — Progress Notes (Signed)
  Echocardiogram 2D Echocardiogram has been performed.  Bradley Short 08/23/2016, 10:51 AM

## 2016-08-24 ENCOUNTER — Inpatient Hospital Stay (HOSPITAL_COMMUNITY): Payer: Medicare Other | Admitting: Registered Nurse

## 2016-08-24 ENCOUNTER — Encounter (HOSPITAL_COMMUNITY): Payer: Self-pay | Admitting: *Deleted

## 2016-08-24 ENCOUNTER — Encounter (HOSPITAL_COMMUNITY)
Admission: AD | Disposition: A | Discharge status: Home Health | Payer: Self-pay | Source: Other Acute Inpatient Hospital | Attending: General Surgery

## 2016-08-24 HISTORY — PX: COLONOSCOPY WITH PROPOFOL: SHX5780

## 2016-08-24 LAB — CBC
HEMATOCRIT: 30.2 % — AB (ref 39.0–52.0)
HEMOGLOBIN: 10.2 g/dL — AB (ref 13.0–17.0)
MCH: 30.1 pg (ref 26.0–34.0)
MCHC: 33.8 g/dL (ref 30.0–36.0)
MCV: 89.1 fL (ref 78.0–100.0)
Platelets: 133 10*3/uL — ABNORMAL LOW (ref 150–400)
RBC: 3.39 MIL/uL — AB (ref 4.22–5.81)
RDW: 14.6 % (ref 11.5–15.5)
WBC: 9.7 10*3/uL (ref 4.0–10.5)

## 2016-08-24 LAB — RENAL FUNCTION PANEL
ALBUMIN: 3.2 g/dL — AB (ref 3.5–5.0)
ANION GAP: 7 (ref 5–15)
BUN: 11 mg/dL (ref 6–20)
CALCIUM: 8.3 mg/dL — AB (ref 8.9–10.3)
CO2: 24 mmol/L (ref 22–32)
Chloride: 112 mmol/L — ABNORMAL HIGH (ref 101–111)
Creatinine, Ser: 1.36 mg/dL — ABNORMAL HIGH (ref 0.61–1.24)
GFR calc Af Amer: 54 mL/min — ABNORMAL LOW (ref >60)
GFR, EST NON AFRICAN AMERICAN: 47 mL/min — AB (ref >60)
Glucose, Bld: 118 mg/dL — ABNORMAL HIGH (ref 65–99)
POTASSIUM: 3.7 mmol/L (ref 3.5–5.1)
Phosphorus: 1.9 mg/dL — ABNORMAL LOW (ref 2.5–4.6)
SODIUM: 143 mmol/L (ref 135–145)

## 2016-08-24 LAB — GLUCOSE, CAPILLARY
GLUCOSE-CAPILLARY: 122 mg/dL — AB (ref 65–99)
Glucose-Capillary: 109 mg/dL — ABNORMAL HIGH (ref 65–99)
Glucose-Capillary: 104 mg/dL — ABNORMAL HIGH (ref 65–99)
Glucose-Capillary: 108 mg/dL — ABNORMAL HIGH (ref 65–99)

## 2016-08-24 LAB — PHOSPHORUS: PHOSPHORUS: 1.9 mg/dL — AB (ref 2.5–4.6)

## 2016-08-24 SURGERY — COLONOSCOPY WITH PROPOFOL
Anesthesia: Monitor Anesthesia Care

## 2016-08-24 MED ORDER — SPOT INK MARKER SYRINGE KIT
PACK | SUBMUCOSAL | Status: DC | PRN
  Administered 2016-08-24: 1 mL via SUBMUCOSAL

## 2016-08-24 MED ORDER — POTASSIUM PHOSPHATES 15 MMOLE/5ML IV SOLN
20.0000 mmol | Freq: Once | INTRAVENOUS | Status: AC
  Administered 2016-08-24: 20 mmol via INTRAVENOUS
  Filled 2016-08-24: qty 6.67

## 2016-08-24 MED ORDER — LACTATED RINGERS IV SOLN
INTRAVENOUS | Status: DC | PRN
  Administered 2016-08-24: 11:00:00 via INTRAVENOUS

## 2016-08-24 MED ORDER — SPOT INK MARKER SYRINGE KIT
PACK | SUBMUCOSAL | Status: AC
  Filled 2016-08-24: qty 5

## 2016-08-24 MED ORDER — SODIUM CHLORIDE 0.9 % IV SOLN
INTRAVENOUS | Status: DC
  Administered 2016-08-25: 50 mL via INTRAVENOUS

## 2016-08-24 MED ORDER — PROPOFOL 10 MG/ML IV BOLUS
INTRAVENOUS | Status: AC
  Filled 2016-08-24: qty 20

## 2016-08-24 MED ORDER — PROPOFOL 10 MG/ML IV BOLUS
INTRAVENOUS | Status: AC
  Filled 2016-08-24: qty 40

## 2016-08-24 MED ORDER — LIDOCAINE 2% (20 MG/ML) 5 ML SYRINGE
INTRAMUSCULAR | Status: DC | PRN
  Administered 2016-08-24: 100 mg via INTRAVENOUS

## 2016-08-24 MED ORDER — PROPOFOL 500 MG/50ML IV EMUL
INTRAVENOUS | Status: DC | PRN
  Administered 2016-08-24: 120 ug/kg/min via INTRAVENOUS

## 2016-08-24 MED ORDER — PROPOFOL 10 MG/ML IV BOLUS
INTRAVENOUS | Status: DC | PRN
  Administered 2016-08-24 (×2): 20 mg via INTRAVENOUS

## 2016-08-24 SURGICAL SUPPLY — 21 items

## 2016-08-24 NOTE — Progress Notes (Signed)
Came by to see patient but lab in room trying to do blood draw.  Briefly discussed with family why we were called.  Will need to talk to Dr. Amedeo Plenty to see where he thinks the acute bleed was coming from, await pathology result and have cardiology see him for operative risk assessment.  Full consult to follow tomorrow.

## 2016-08-24 NOTE — Addendum Note (Signed)
Addendum  created 08/24/16 1404 by Janeece Riggers, MD   Anesthesia Attestations filed, Sign clinical note

## 2016-08-24 NOTE — Progress Notes (Addendum)
GI: Colonoscopy performed. There was a circumferential umbilicated polypoid cecal mass that appeared to surround an obscure the appendix. It was approximately 2-2.5 cm in diameter. There was no stalk. Biopsies were taken. Other findings were an incidental 8 mm cecal polyp which was removed but lost, diverticuli from the splenic flexure to the sigmoid colon without any evidence of bleeding and enlarged but nonbleeding external hemorrhoids. The lesion was tattooed with [place] ink. Recommend surgical consult regardless of biopsy results. This lesion is not amenable to endoscopic removal. No active bleeding encountered from diverticuli or the cecal lesion. May cautiously restart anticoagulation at cardiology's discretion

## 2016-08-24 NOTE — H&P (View-Only) (Signed)
Pharmacy: phosphorus replacement  Patient's an 83 y.o M admitted to [facility] with rectal bleeding.  Pharmacy to assist with phosphorous replacement.  Today, 08/24/2016: - phosphorus level remains low at 1.9 despite 10 mmol of Kphos given on 6/27 - K 3.7, Na 140 - scr down 1.36 - Calcium 8.4, Albumin wnl  Plan: - potassium phosphate 20 mmol IV x1 over 6 hours  today  - check phosphorus level with AM labs on 6/29 and give additional doses if needed  Dia Sitter, PharmD, BCPS 08/24/2016 8:37 AM

## 2016-08-24 NOTE — Anesthesia Postprocedure Evaluation (Signed)
Anesthesia Post Note  Patient: Bradley Short  Procedure(s) Performed: Procedure(s) (LRB): COLONOSCOPY WITH PROPOFOL (N/A)     Patient location during evaluation: PACU Anesthesia Type: MAC Level of consciousness: awake and alert Pain management: pain level controlled Vital Signs Assessment: post-procedure vital signs reviewed and stable Respiratory status: spontaneous breathing, nonlabored ventilation, respiratory function stable and patient connected to nasal cannula oxygen Cardiovascular status: stable and blood pressure returned to baseline Anesthetic complications: no    Last Vitals:  Vitals:   08/24/16 1331 08/24/16 1345  BP: 99/62 100/61  Pulse: (!) 107 (!) 102  Resp: (!) 24 16  Temp:      Last Pain:  Vitals:   08/24/16 1229  TempSrc: Oral                 [name]

## 2016-08-24 NOTE — Anesthesia Preprocedure Evaluation (Addendum)
Anesthesia Evaluation  Patient identified by MRN, date of birth, ID band Patient awake    Reviewed: Allergy & Precautions, NPO status , Patient's Chart, lab work & pertinent test results  Airway Mallampati: II  TM Distance: >3 FB Neck ROM: Full    Dental no notable dental hx.    Pulmonary neg pulmonary ROS,    Pulmonary exam normal breath sounds clear to auscultation       Cardiovascular negative cardio ROS Normal cardiovascular exam Rhythm:Regular Rate:Normal     Neuro/Psych negative neurological ROS  negative psych ROS   GI/Hepatic negative GI ROS, Neg liver ROS,   Endo/Other  negative endocrine ROS  Renal/GU negative Renal ROS  negative genitourinary   Musculoskeletal negative musculoskeletal ROS (+)   Abdominal   Peds negative pediatric ROS (+)  Hematology negative hematology ROS (+)   Anesthesia Other Findings   Reproductive/Obstetrics negative OB ROS                             Anesthesia Physical Anesthesia Plan  ASA: III  Anesthesia Plan: MAC   Post-op Pain Management:    Induction: Intravenous  PONV Risk Score and Plan:   Airway Management Planned: Mask and Natural Airway  Additional Equipment:   Intra-op Plan:   Post-operative Plan:   Informed Consent:   Plan Discussed with:   Anesthesia Plan Comments:         Anesthesia Quick Evaluation

## 2016-08-24 NOTE — Interval H&P Note (Signed)
History and Physical Interval Note:  08/24/2016 11:17 AM  Bradley Short  has presented today for surgery, with the diagnosis of Rectal bleeding  The various methods of treatment have been discussed with the patient and family. After consideration of risks, benefits and other options for treatment, the patient has consented to  Procedure(s): COLONOSCOPY WITH PROPOFOL (N/A) as a surgical intervention .  The patient's history has been reviewed, patient examined, no change in status, stable for surgery.  I have reviewed the patient's chart and labs.  Questions were answered to the patient's satisfaction.     Alayja Armas C

## 2016-08-24 NOTE — Progress Notes (Signed)
Pharmacy: phosphorus replacement  Patient's an 83 y.o M admitted to [facility] with rectal bleeding.  Pharmacy to assist with phosphorous replacement.  Today, 08/24/2016: - phosphorus level remains low at 1.9 despite 10 mmol of Kphos given on 6/27 - K 3.7, Na 140 - scr down 1.36 - Calcium 8.4, Albumin wnl  Plan: - potassium phosphate 20 mmol IV x1 over 6 hours  today  - check phosphorus level with AM labs on 6/29 and give additional doses if needed  Dia Sitter, PharmD, BCPS 08/24/2016 8:37 AM

## 2016-08-24 NOTE — Transfer of Care (Signed)
Immediate Anesthesia Transfer of Care Note  Patient: Bradley Short  Procedure(s) Performed: Procedure(s): COLONOSCOPY WITH PROPOFOL (N/A)  Patient Location: PACU and Endoscopy Unit  Anesthesia Type:MAC  Level of Consciousness: awake, alert , oriented and patient cooperative  Airway & Oxygen Therapy: Patient Spontanous Breathing and Patient connected to face mask oxygen  Post-op Assessment: Report given to RN, Post -op Vital signs reviewed and stable and Patient moving all extremities  Post vital signs: Reviewed and stable  Last Vitals:  Vitals:   08/24/16 1000 08/24/16 1053  BP: 139/60 (!) 172/68  Pulse: 61 63  Resp: 14 20  Temp:  36.6 C    Last Pain:  Vitals:   08/24/16 1053  TempSrc: Oral         Complications: No apparent anesthesia complications

## 2016-08-24 NOTE — Progress Notes (Signed)
PROGRESS NOTE   Bradley Short  ZOX:[id number]    DOB: 1932-05-23    DOA: 08/21/2016  PCP: Bradley Hilding, MD   I have briefly reviewed patients previous medical records in [place].  Brief Narrative:  83 year old male with PMH of PAF on Xarelto, HLD, OSA on CPAP, gout, presented with multiple episodes of painless BRBPR, weakness, diaphoresis, went to [facility] ED where hemoglobin around 12 g, initially A. fib with RVR which improved with IV fluids, CT abdomen and pelvis showed sigmoid diverticulosis without acute findings, transferred to [facility] stepdown unit. Hemodynamically and hemoglobin stable. [facility] consulted and plan colonoscopy 08/24/16.  Assessment & Plan:   Principal Problem:   Acute GI bleeding Active Problems:   Pure hypercholesterolemia   ATRIAL FIBRILLATION   Acute blood loss anemia   Gout   Leukocytosis   Thrombocytopenia (HCC)   CKD (chronic kidney disease), stage III   Syncope, vasovagal   1. Acute GI bleed, suspect LGIB:  suspect diverticular bleed versus AVM versus hemorrhoids while on Xarelto (last dose 08/20/16). Hemodynamically and hemoglobin stable. Continue Protonix 40 mg IV daily at bedtime.Underwent bowel prep for colonoscopy last night and reports bloody watery stools. Scheduled for colonoscopy today. 2. Paroxysmal atrial fibrillation: Currently in sinus rhythm. Not on rate control medications PTA. Xarelto on hold pending GI evaluation for bleeding.. 3. Acute blood loss anemia: Secondary to GI bleed. Hemoglobin has been stable over the last 48 hours. Follow CBC in a.m. and transfuse if hemoglobin <8 g per DL. 4. Syncope: Suspect vasovagal due to acute GI bleed. Telemetry without significant arrhythmias as cause. 2-D echo: LVEF 60-65 percent. Moderate diastolic dysfunction. No significant valvular abnormalities. IV normal saline hydration-continue.. 5. Hyperlipidemia: Holding by mouth meds. Resume medications post  colonoscopy. 6. Gout: No acute flare. Continue allopurinol. 7. Stage III chronic kidney disease: Baseline creatinine not known. Creatinine has stabilized in the 1.3 range. 8. Thrombocytopenia: Secondary to acute blood loss. Stable. 9. Leukocytosis: Likely stress response. Resolved. 10. Hypophosphatemia: Replaced per pharmacy and follow.   DVT prophylaxis: SCDs Code Status: Full Family Communication: Discussed in detail with patient's spouse at bedside. Disposition: Consider transferring to medical bed pending colonoscopy results and GI recommendations. DC home when medically improved and stable.   Consultants:  [facility]   Procedures:  None  Antimicrobials:  None    Subjective: Underwent bowel prep overnight with multiple BMs, no solid stools, liquid red stools. No BM. Feels generally weak but no dizziness or lightheadedness. Patient was seen this morning prior to colonoscopy.  ROS: No chest pain or dyspnea reported.  Objective:  Vitals:   08/24/16 0600 08/24/16 0800 08/24/16 1000 08/24/16 1053  BP: (!) 133/48 137/62 139/60 (!) 172/68  Pulse: (!) 56 (!) 54 61 63  Resp: 15 13 14 20   Temp:    97.9 F (36.6 C)  TempSrc:    Oral  SpO2:  98% 98% 99%  Weight:      Height:        Examination:  General exam: Pleasant elderly male lying comfortably supine in bed.Looks better than he did yesterday. Respiratory system: Clear to auscultation. No increased work of breathing. Cardiovascular system: S1 and S2 heard, RRR. No JVD, murmurs or pedal edema. Telemetry: Sinus rhythm with first-degree AV block. Gastrointestinal system: Abdomen is nondistended, soft and nontender. Normal bowel sounds heard. Central nervous system: Alert and oriented. No focal neurological deficits. Stable. Extremities: Symmetric 5 x 5 power. No change. Skin: No rashes,  lesions or ulcers Psychiatry: Judgement and insight appear normal. Mood & affect appropriate. Stable.    Data Reviewed: I have  personally reviewed following labs and imaging studies  CBC:  Recent Labs Lab 08/22/16 0302 08/22/16 0915 08/23/16 0916 08/23/16 1738 08/24/16 0700  WBC 14.1* 12.5* 11.0* 11.1* 9.7  NEUTROABS  --   --  9.1*  --   --   HGB 11.2* 10.8* 11.1* 10.6* 10.2*  HCT 32.8* 31.8* 33.4* 31.6* 30.2*  MCV 89.1 89.8 89.3 89.0 89.1  PLT 148* 139* 134* 135* 048*   Basic Metabolic Panel:  Recent Labs Lab 08/21/16 2303 08/22/16 0302 08/23/16 0916 08/24/16 0700  NA 138 141 140 143  K 4.1 3.9 3.7 3.7  CL 108 112* 109 112*  CO2 23 22 22 24   GLUCOSE 151* 124* 172* 118*  BUN 18 19 17 11   CREATININE 1.34* 1.35* 1.48* 1.36*  CALCIUM 8.3* 8.4* 8.4* 8.3*  MG  --   --  2.1  --   PHOS  --   --  1.9* 1.9*  1.9*   Liver Function Tests:  Recent Labs Lab 08/21/16 2303 08/23/16 0916 08/24/16 0700  AST 29 31  --   ALT 19 18  --   ALKPHOS 53 56  --   BILITOT 0.9 0.5  --   PROT 6.3* 6.3*  --   ALBUMIN 3.3* 3.5 3.2*   Coagulation Profile:  Recent Labs Lab 08/21/16 2303  INR 1.03   CBG:  Recent Labs Lab 08/23/16 0737 08/23/16 1526 08/23/16 2319 08/24/16 0539 08/24/16 0736  GLUCAP 109* 110* 107* 109* 104*    Recent Results (from the past 240 hour(s))  MRSA PCR Screening     Status: None   Collection Time: 08/21/16 10:20 PM  Result Value Ref Range Status   MRSA by PCR NEGATIVE NEGATIVE Final    Comment:        The GeneXpert MRSA Assay (FDA approved for NASAL specimens only), is one component of a comprehensive MRSA colonization surveillance program. It is not intended to diagnose MRSA infection nor to guide or monitor treatment for MRSA infections.          Radiology Studies: No results found.      Scheduled Meds: . [MAR Hold] allopurinol  100 mg Oral Daily  . [MAR Hold] pantoprazole (PROTONIX) IV  40 mg Intravenous QHS   Continuous Infusions: . potassium phosphate IVPB (mmol) 20 mmol (08/24/16 1012)     LOS: 3 days     Karthikeya Funke, MD, FACP,  FHM. [facility] Pager [phone]  If 7PM-7AM, please contact night-coverage [email or web address] Password [email or web address] 08/24/2016, 11:12 AM

## 2016-08-24 NOTE — Anesthesia Postprocedure Evaluation (Signed)
Anesthesia Post Note  Patient: Bradley Short  Procedure(s) Performed: Procedure(s) (LRB): COLONOSCOPY WITH PROPOFOL (N/A)     Patient location during evaluation: PACU Anesthesia Type: MAC Level of consciousness: awake and alert Pain management: pain level controlled Vital Signs Assessment: post-procedure vital signs reviewed and stable Respiratory status: spontaneous breathing, nonlabored ventilation, respiratory function stable and patient connected to nasal cannula oxygen Cardiovascular status: stable and blood pressure returned to baseline Anesthetic complications: no    Last Vitals:  Vitals:   08/24/16 1331 08/24/16 1345  BP: 99/62 100/61  Pulse: (!) 107 (!) 102  Resp: (!) 24 16  Temp:      Last Pain:  Vitals:   08/24/16 1229  TempSrc: Oral                 Nielle Duford     

## 2016-08-24 NOTE — Op Note (Signed)
[facility] Patient Name: Bradley Short Procedure Date: 08/24/2016 MRN: [id number] Attending MD: Missy Sabins , MD Date of Birth: [birth year] CSN: [id number] Age: 83 Admit Type: Inpatient Procedure:                Colonoscopy Indications:              Hematochezia Providers:                Elyse Jarvis. Amedeo Plenty, MD, Laverta Baltimore RN, RN, Tinnie Gens, Technician Referring MD:              Medicines:                Propofol per Anesthesia Complications:            No immediate complications. Estimated Blood Loss:     Estimated blood loss: none. Procedure:                Pre-Anesthesia Assessment:                           - Prior to the procedure, a History and Physical                            was performed, and patient medications and                            allergies were reviewed. The patient's tolerance of                            previous anesthesia was also reviewed. The risks                            and benefits of the procedure and the sedation                            options and risks were discussed with the patient.                            All questions were answered, and informed consent                            was obtained. Prior Anticoagulants: The patient has                            taken Xarelto (rivaroxaban), last dose was 4 days                            prior to procedure. ASA Grade Assessment: III - A                            patient with severe systemic disease. After  reviewing the risks and benefits, the patient was                            deemed in satisfactory condition to undergo the                            procedure.                           After obtaining informed consent, the colonoscope                            was passed under direct vision. Throughout the                            procedure, the patient's blood pressure, pulse, and     oxygen saturations were monitored continuously. The                            EC-[id number] ([id number]) scope was introduced through                            the anus and advanced to the the cecum, identified                            by appendiceal orifice and ileocecal valve. The                            colonoscopy was performed without difficulty. The                            patient tolerated the procedure well. The quality                            of the bowel preparation was adequate to identify                            polyps 6 mm and larger in size and fair. The                            ileocecal valve, appendiceal orifice, and rectum                            were photographed. Scope In: 11:36:40 AM Scope Out: 12:17:37 PM Scope Withdrawal Time: 0 hours 26 minutes 25 seconds  Total Procedure Duration: 0 hours 40 minutes 57 seconds  Findings:      A 7 mm polyp was found in the cecum. The polyp was sessile. The polyp       was removed with a hot snare. Resection was complete, but the polyp       tissue was not retrieved.      A frond-like/villous and sessile non-obstructing small mass was found in       the cecum and appendiceal orifice. The mass was non-circumferential. The       mass measured  two cm in length. In addition, its diameter measured       twenty mm. No bleeding was present. This was biopsied with a cold       forceps for histology. Area was tattooed with an injection of 1 mL of       [place] ink.      Scattered diverticula were found in the sigmoid colon, descending colon       and splenic flexure.      Non-bleeding external hemorrhoids were found during endoscopy. The       hemorrhoids were Grade II (internal hemorrhoids that prolapse but reduce       spontaneously).      The exam was otherwise without abnormality. Impression:               - Preparation of the colon was fair.                           - One 7 mm polyp in the cecum, removed with a hot                             snare. Complete resection. Polyp tissue not                            retrieved.                           - Likely malignant tumor in the cecum and at the                            appendiceal orifice. Biopsied. Tattooed.                           - Diverticulosis in the sigmoid colon, in the                            descending colon and at the splenic flexure.                           - Non-bleeding external hemorrhoids.                           - The examination was otherwise normal. Moderate Sedation:      no moderate sedation Recommendation:           - Patient has a contact number available for                            emergencies. The signs and symptoms of potential                            delayed complications were discussed with the                            patient. Return to normal activities tomorrow.  Written discharge instructions were provided to the                            patient.                           - Resume previous diet.                           - Continue present medications.                           - Await pathology results.                           - Refer to a colo-rectal surgeon at appointment to                            be scheduled.                           - Repeat colonoscopy date to be determined after                            pending pathology results are reviewed for                            surveillance based on pathology results. Procedure Code(s):        --- Professional ---                           [phone], Colonoscopy, flexible; with removal of                            tumor(s), polyp(s), or other lesion(s) by snare                            technique                           45381, Colonoscopy, flexible; with directed                            submucosal injection(s), any substance                           09326, 59, Colonoscopy, flexible; with biopsy,                             single or multiple Diagnosis Code(s):        --- Professional ---                           K64.1, Second degree hemorrhoids                           K64.4, Residual hemorrhoidal skin tags  D12.0, Benign neoplasm of cecum                           D49.0, Neoplasm of unspecified behavior of                            digestive system                           K92.1, Melena (includes Hematochezia)                           K57.30, Diverticulosis of large intestine without                            perforation or abscess without bleeding CPT copyright 2016 [employer]. All rights reserved. The codes documented in this report are preliminary and upon coder review may  be revised to meet current compliance requirements. Missy Sabins, MD 08/24/2016 12:28:10 PM This report has been signed electronically. Number of Addenda: 0

## 2016-08-25 ENCOUNTER — Other Ambulatory Visit: Payer: Self-pay

## 2016-08-25 ENCOUNTER — Encounter (HOSPITAL_COMMUNITY): Payer: Self-pay | Admitting: Gastroenterology

## 2016-08-25 DIAGNOSIS — I480 Paroxysmal atrial fibrillation: Secondary | ICD-10-CM

## 2016-08-25 DIAGNOSIS — K639 Disease of intestine, unspecified: Secondary | ICD-10-CM

## 2016-08-25 DIAGNOSIS — K922 Gastrointestinal hemorrhage, unspecified: Secondary | ICD-10-CM

## 2016-08-25 LAB — CBC
HCT: 31 % — ABNORMAL LOW (ref 39.0–52.0)
Hemoglobin: 10.6 g/dL — ABNORMAL LOW (ref 13.0–17.0)
MCH: 30.1 pg (ref 26.0–34.0)
MCHC: 34.2 g/dL (ref 30.0–36.0)
MCV: 88.1 fL (ref 78.0–100.0)
PLATELETS: 147 10*3/uL — AB (ref 150–400)
RBC: 3.52 MIL/uL — ABNORMAL LOW (ref 4.22–5.81)
RDW: 14.5 % (ref 11.5–15.5)
WBC: 12.2 10*3/uL — AB (ref 4.0–10.5)

## 2016-08-25 LAB — BASIC METABOLIC PANEL
ANION GAP: 6 (ref 5–15)
BUN: 7 mg/dL (ref 6–20)
CALCIUM: 8.2 mg/dL — AB (ref 8.9–10.3)
CO2: 23 mmol/L (ref 22–32)
Chloride: 111 mmol/L (ref 101–111)
Creatinine, Ser: 1.27 mg/dL — ABNORMAL HIGH (ref 0.61–1.24)
GFR calc Af Amer: 59 mL/min — ABNORMAL LOW (ref >60)
GFR, EST NON AFRICAN AMERICAN: 51 mL/min — AB (ref >60)
GLUCOSE: 120 mg/dL — AB (ref 65–99)
Potassium: 3.5 mmol/L (ref 3.5–5.1)
Sodium: 140 mmol/L (ref 135–145)

## 2016-08-25 LAB — GLUCOSE, CAPILLARY
Glucose-Capillary: 122 mg/dL — ABNORMAL HIGH (ref 65–99)
Glucose-Capillary: 116 mg/dL — ABNORMAL HIGH (ref 65–99)
Glucose-Capillary: 123 mg/dL — ABNORMAL HIGH (ref 65–99)

## 2016-08-25 LAB — HEPARIN LEVEL (UNFRACTIONATED): HEPARIN UNFRACTIONATED: 0.16 [IU]/mL — AB (ref 0.30–0.70)

## 2016-08-25 LAB — APTT: APTT: 46 s — AB (ref 24–36)

## 2016-08-25 LAB — PHOSPHORUS: PHOSPHORUS: 2.9 mg/dL (ref 2.5–4.6)

## 2016-08-25 LAB — CEA: CEA: 2.1 ng/mL (ref 0.0–4.7)

## 2016-08-25 MED ORDER — HEPARIN (PORCINE) IN NACL 100-0.45 UNIT/ML-% IJ SOLN
900.0000 [IU]/h | INTRAMUSCULAR | Status: DC
  Administered 2016-08-25: 900 [IU]/h via INTRAVENOUS
  Filled 2016-08-25: qty 250

## 2016-08-25 MED ORDER — ENSURE ENLIVE PO LIQD
237.0000 mL | Freq: Two times a day (BID) | ORAL | Status: DC
  Administered 2016-08-25 – 2016-09-01 (×5): 237 mL via ORAL

## 2016-08-25 MED ORDER — HEPARIN (PORCINE) IN NACL 100-0.45 UNIT/ML-% IJ SOLN
1200.0000 [IU]/h | INTRAMUSCULAR | Status: AC
  Administered 2016-08-26 – 2016-08-27 (×2): 1200 [IU]/h via INTRAVENOUS
  Filled 2016-08-25 (×3): qty 250

## 2016-08-25 NOTE — Progress Notes (Signed)
Pharmacy: Re- heparin  Patient's an 83 y.o M on heparin bridge for afib while xarelto (last dose on 6/24) is on hold with plan for colectomy on Monday 08/28/16.  First Heparin level is low at 0.16 (with risk for bleeding will aim for lower end of goal range -- 0.3-0.5) and aPTT at 46 (goal 66-102 sec)  Plan: - increase heparin drip to 1100 units/hr - check 8 hr heparin level - monitor for s/s bleeding  Dia Sitter, PharmD, BCPS 08/25/2016 9:51 PM

## 2016-08-25 NOTE — Progress Notes (Signed)
ANTICOAGULATION CONSULT NOTE - Initial Consult  Pharmacy Consult for Heparin Indication: atrial fibrillation, bridge while off Xarelto  Allergies  Allergen Reactions  . Diltiazem Hcl     REACTION: Looked like burned all over  . Lisinopril     REACTION: Looked like burned all over  . Metoprolol Succinate     REACTION: Looked like burned all over  . Propafenone Hcl     REACTION: Looked like burned all over    Patient Measurements: Height: 5\' 11"  (180.3 cm) Weight: 205 lb 0.4 oz (93 kg) IBW/kg (Calculated) : 75.3 Heparin Dosing Weight: 93kg  Vital Signs: Temp: 98.2 F (36.8 C) (06/29 1120) Temp Source: Oral (06/29 1120) BP: 128/47 (06/29 1200) Pulse Rate: 78 (06/29 1200)  Labs:  Recent Labs  08/23/16 0916 08/23/16 1738 08/24/16 0700 08/25/16 0324  HGB 11.1* 10.6* 10.2* 10.6*  HCT 33.4* 31.6* 30.2* 31.0*  PLT 134* 135* 133* 147*  CREATININE 1.48*  --  1.36* 1.27*    Estimated Creatinine Clearance: 52.3 mL/min (A) (by C-G formula based on SCr of 1.27 mg/dL (H)).   Medical History: Past Medical History:  Diagnosis Date  . Atrial fibrillation (HCC)    paroxysmal, s/p PVI by Dr Rayann Heman 8/12  . Atrial flutter ([place])    s/p CTI ablation  . Hyperlipidemia   . Obstructive sleep apnea (adult) (pediatric)    on CPAP    Medications:  Infusions:   Xarelto 20mg  daily PTA- LD: 08/20/16  Assessment: 84 yoM on chronic anticoagulation for Afib.  He was admitted with BRBPR on 6/25.  Xarelto was held and he had colonoscopy on 6/28.  No active bleeding found, but incidentally found cecal mass that was suspicious for malignancy (bx results pending).  Surgery was consulted for removal which is tentatively planned for Monday.  Pharmacy is consulted to bridge with heparin in the interim per cardiology recommendations.      Goal of Therapy:  Heparin level 0.3-0.7 units/ml-  Monitor platelets by anticoagulation protocol: Yes   Plan:  No bolus per surgeon request Start heparin  infusion at 900 units/hr Check anti-Xa level in 8 hours and daily while on heparin Continue to monitor H&H and platelets  Tentatively, hold heparin 8h pre-procedure.  Surgeon will finalize plans over weekend.    Biagio Borg 08/25/2016,1:16 PM

## 2016-08-25 NOTE — Evaluation (Signed)
Physical Therapy Evaluation Patient Details Name: Bradley Short MRN: [id number] DOB: 17-Feb-1933 Today's Date: 08/25/2016   History of Present Illness  83 year old male who was admitted to the hospital with acute lower GI bleeding.  Pt with hx of chronic atrial fibrillation and is on chronic anticoagulation. Pt underwent a colonoscopy which demonstrated diverticular disease (this is felt to be the source of his bleeding) (and a incidentally found neoplasm in the cecum at the appendiceal orifice that was suspicious for malignancy and pt with plans for lap assisted R colectomy Monday   Clinical Impression  Pt admitted with above diagnosis. Pt currently with functional limitations due to the deficits listed below (see PT Problem List). Pt will benefit from skilled PT to increase their independence and safety with mobility to allow discharge to the venue listed below.  Pt reports he has not ambulated since admission and happy to be walking in hallway.  Pt encouraged to continue ambulating over the weekend with nursing in preparation for upcoming surgery on Monday (7/2).  Will likely check back on Tuesday and check on pt progression.     Follow Up Recommendations No PT follow up (will likely progress to no needs, will f/u after surgery)    Equipment Recommendations  None recommended by PT    Recommendations for Other Services       Precautions / Restrictions Precautions Precautions: Fall      Mobility  Bed Mobility Overal bed mobility: Needs Assistance Bed Mobility: Supine to Sit;Sit to Supine     Supine to sit: Supervision Sit to supine: Supervision   General bed mobility comments: supervision for lines only  Transfers Overall transfer level: Needs assistance Equipment used: Rolling walker (2 wheeled) Transfers: Sit to/from Stand Sit to Stand: Min guard         General transfer comment: verbal cues for hand placement  Ambulation/Gait Ambulation/Gait assistance: Min  guard Ambulation Distance (Feet): 240 Feet Assistive device: Rolling walker (2 wheeled) Gait Pattern/deviations: Step-through pattern;Decreased stride length     General Gait Details: verbal cues for safe use of RW, pt denies dizziness  Stairs            Wheelchair Mobility    Modified Rankin (Stroke Patients Only)       Balance Overall balance assessment:  (denies any fall hx)                                           Pertinent Vitals/Pain Pain Assessment: No/denies pain    Home Living Family/patient expects to be discharged to:: Private residence Living Arrangements: Spouse/significant other   Type of Home: [place]: One level   Additional Comments: has access to RW    Prior Function Level of Independence: Independent               Hand Dominance        Extremity/Trunk Assessment        Lower Extremity Assessment Lower Extremity Assessment: Generalized weakness    Cervical / Trunk Assessment Cervical / Trunk Assessment: Normal  Communication   Communication: No difficulties  Cognition Arousal/Alertness: Awake/alert Behavior During Therapy: WFL for tasks assessed/performed Overall Cognitive Status: Within Functional Limits for tasks assessed  General Comments      Exercises     Assessment/Plan    PT Assessment Patient needs continued PT services  PT Problem List Decreased strength;Decreased mobility;Decreased activity tolerance;Decreased knowledge of use of DME       PT Treatment Interventions Gait training;Therapeutic exercise;Therapeutic activities;DME instruction;Patient/family education;Stair training;Functional mobility training    PT Goals (Current goals can be found in the Care Plan section)  Acute Rehab PT Goals PT Goal Formulation: With patient Time For Goal Achievement: 09/01/16 Potential to Achieve Goals: Good    Frequency  Min 3X/week   Barriers to discharge        Co-evaluation               AM-PAC PT "6 Clicks" Daily Activity  Outcome Measure Difficulty turning over in bed (including adjusting bedclothes, sheets and blankets)?: None Difficulty moving from lying on back to sitting on the side of the bed? : A Little Difficulty sitting down on and standing up from a chair with arms (e.g., wheelchair, bedside commode, etc,.)?: A Little Help needed moving to and from a bed to chair (including a wheelchair)?: A Little Help needed walking in hospital room?: A Little Help needed climbing 3-5 steps with a railing? : A Little 6 Click Score: 19    End of Session Equipment Utilized During Treatment: Gait belt Activity Tolerance: Patient tolerated treatment well Patient left: in bed;with family/visitor present;with call bell/phone within reach;with nursing/sitter in room Nurse Communication: Mobility status PT Visit Diagnosis: Difficulty in walking, not elsewhere classified (R26.2)    Time: 2111-7356 PT Time Calculation (min) (ACUTE ONLY): 15 min   Charges:   PT Evaluation $PT Eval Low Complexity: 1 Procedure     PT G CodesCarmelia Bake, PT, DPT 08/25/2016 Pager: [phone]   York Ram E 08/25/2016, 4:07 PM

## 2016-08-25 NOTE — Progress Notes (Signed)
[facility] Progress Note  Bradley Short 83 y.o. [birth year]  CC:  Rectal bleeding   Subjective:  No further bleeding episodes. Complaining of bilateral lower quadrant abdominal discomfort. Denied any nausea or vomiting. Tolerating food. Colonoscopy yesterday showed a polypoid lesion in the cecum which could be arising from or around appendiceal orifice. Multiple diverticula.  ROS : Negative for chest pain and shortness of breath.   Objective: Vital signs in last 24 hours: Vitals:   08/25/16 0750 08/25/16 0800  BP:  139/67  Pulse:  71  Resp:  (!) 22  Temp: 98 F (36.7 C)     Physical Exam:  General:  Alert, cooperative, no distress, appears stated age  Head:  Normocephalic, without obvious abnormality, atraumatic  Eyes:  , EOM's intact,   Lungs:   Clear to auscultation bilaterally, respirations unlabored  Heart:  Regular rate and rhythm, S1, S2 normal  Abdomen:   Soft, non-tender, bowel sounds active all four quadrants,  no masses,   Extremities: Extremities normal, atraumatic, no  edema       Lab Results:  Recent Labs  08/23/16 0916 08/24/16 0700 08/25/16 0324  NA 140 143 140  K 3.7 3.7 3.5  CL 109 112* 111  CO2 22 24 23   GLUCOSE 172* 118* 120*  BUN 17 11 7   CREATININE 1.48* 1.36* 1.27*  CALCIUM 8.4* 8.3* 8.2*  MG 2.1  --   --   PHOS 1.9* 1.9*  1.9* 2.9    Recent Labs  08/23/16 0916 08/24/16 0700  AST 31  --   ALT 18  --   ALKPHOS 56  --   BILITOT 0.5  --   PROT 6.3*  --   ALBUMIN 3.5 3.2*    Recent Labs  08/23/16 0916  08/24/16 0700 08/25/16 0324  WBC 11.0*  < > 9.7 12.2*  NEUTROABS 9.1*  --   --   --   HGB 11.1*  < > 10.2* 10.6*  HCT 33.4*  < > 30.2* 31.0*  MCV 89.3  < > 89.1 88.1  PLT 134*  < > 133* 147*  < > = values in this interval not displayed. No results for input(s): LABPROT, INR in the last 72 hours.    Assessment/Plan: - BRBPR while on Xarelto.Resolved. No further bleeding episodes. Hemoglobin stable. Could be  diverticular in nature. - Polypoid lesion in the cecum. Around 2-2.5 cm in size. May  be arising around the appendiceal orifice. Not amenable to remove endoscopically. Biopsies pending - Atrial fibrillation on Xarelto - last dose 08/20/2016 night - Chronic kidney disease -Family History of colon cancer in brother in his late 38s   Recommendations ------------------------ - Colonoscopy yesterday showed around 2-2.5 cm polypoid lesion in the cecum which could be arising around the appendiceal orifice. Not amenable to remove endoscopically. I have discussed this with Dr. Amedeo Plenty. He does not think this would be the bleeding source. Bleeding most likely would be diverticular in origin. But regardless  of the bleeding source, this lesion needs to be removed surgically. Biopsy pending. - Appreciate surgery input. - No further inpatient endoscopic workup from GI standpoint.  - Recommend repeat colonoscopy in 1 year. -Follow Up in GI clinic after discharge.GI will sign off. Call us back if needed.   Otis Brace MD, [place] 08/25/2016, 9:18 AM  Pager [phone]  If no answer or after 5 PM call [phone]

## 2016-08-25 NOTE — Progress Notes (Signed)
Patient transferred from ICU. Patient stable with no complaints at this time. No changes from previous assessment. Telemetry applied and vital signs stable. Oriented to the room and equipment.   Bradley Short [facility] 08/25/2016

## 2016-08-25 NOTE — Consult Note (Addendum)
Reason for Consult: Bradley Short Referring Physician: Dr. Otila Back Bradley Short is an 83 y.o. male.  HPI: This is an 83 year old male who was admitted to the hospital with acute lower GI bleeding.  Of note is that he has chronic atrial fibrillation and is on chronic anticoagulation. 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 A biopsy was performed. We were asked to see him because the finding of the neoplasm. The pathology is pending at this time. CEA is normal. Family history of colon cancer in his brother. His wife is in the room with him.  Past Medical History:  Diagnosis Date  . Atrial fibrillation (HCC)    paroxysmal, s/p PVI by Dr Rayann Heman 8/12  . Atrial flutter ([place])    s/p CTI ablation  . Hyperlipidemia   . Obstructive sleep apnea (adult) (pediatric)    on CPAP    Past Surgical History:  Procedure Laterality Date  . ATRIAL ABLATION SURGERY     s/p CTI and PVI ablations by Dr Rayann Heman 8/12  . CARDIAC CATHETERIZATION     nonobstructive CAD 2000  . melanoma (other)     resected from his right shoulder    Family History  Problem Relation Age of Onset  . Prostate cancer Father   . Seizures Other   . Cancer Other   . Diabetes Other   . Heart disease Other     Social History:  reports that he has never smoked. He has never used smokeless tobacco. He reports that he does not drink alcohol or use drugs.  Allergies:  Allergies  Allergen Reactions  . Diltiazem Hcl     REACTION: Looked like burned all over  . Lisinopril     REACTION: Looked like burned all over  . Metoprolol Succinate     REACTION: Looked like burned all over  . Propafenone Hcl     REACTION: Looked like burned all over     Current Facility-Administered Medications:  .  acetaminophen (TYLENOL) tablet 650 mg, 650  mg, Oral, Q6H PRN **OR** acetaminophen (TYLENOL) suppository 650 mg, 650 mg, Rectal, Q6H PRN, Rise Patience, MD .  allopurinol (ZYLOPRIM) tablet 100 mg, 100 mg, Oral, Daily, Rise Patience, MD, 100 mg at 08/25/16 0921 .  LORazepam (ATIVAN) tablet 0.5 mg, 0.5 mg, Oral, Q12H PRN, Hongalgi, Anand D, MD .  ondansetron (ZOFRAN) tablet 4 mg, 4 mg, Oral, Q6H PRN **OR** ondansetron (ZOFRAN) injection 4 mg, 4 mg, Intravenous, Q6H PRN, Rise Patience, MD .  pantoprazole (PROTONIX) injection 40 mg, 40 mg, Intravenous, QHS, Rise Patience, MD, 40 mg at 08/24/16 2155   Results for orders placed or performed during the hospital encounter of 08/21/16 (from the past 48 hour(s))  Glucose, capillary     Status: Abnormal   Collection Time: 08/23/16  3:26 PM  Result Value Ref Range   Glucose-Capillary 110 (H) 65 - 99 mg/dL   Comment 1 Notify RN    Comment 2 Document in Chart   CBC     Status: Abnormal   Collection Time: 08/23/16  5:38 PM  Result Value Ref Range   WBC 11.1 (H) 4.0 - 10.5 K/uL   RBC 3.55 (L) 4.22 - 5.81 MIL/uL   Hemoglobin 10.6 (L) 13.0 - 17.0 g/dL   HCT 31.6 (L) 39.0 - 52.0 %  MCV 89.0 78.0 - 100.0 fL   MCH 29.9 26.0 - 34.0 pg   MCHC 33.5 30.0 - 36.0 g/dL   RDW 14.5 11.5 - 15.5 %   Platelets 135 (L) 150 - 400 K/uL  Glucose, capillary     Status: Abnormal   Collection Time: 08/23/16 11:19 PM  Result Value Ref Range   Glucose-Capillary 107 (H) 65 - 99 mg/dL   Comment 1 Notify RN    Comment 2 Document in Chart   Glucose, capillary     Status: Abnormal   Collection Time: 08/24/16  5:39 AM  Result Value Ref Range   Glucose-Capillary 109 (H) 65 - 99 mg/dL   Comment 1 Notify RN    Comment 2 Document in Chart   CBC     Status: Abnormal   Collection Time: 08/24/16  7:00 AM  Result Value Ref Range   WBC 9.7 4.0 - 10.5 K/uL   RBC 3.39 (L) 4.22 - 5.81 MIL/uL   Hemoglobin 10.2 (L) 13.0 - 17.0 g/dL   HCT 30.2 (L) 39.0 - 52.0 %   MCV 89.1 78.0 - 100.0 fL   MCH  30.1 26.0 - 34.0 pg   MCHC 33.8 30.0 - 36.0 g/dL   RDW 14.6 11.5 - 15.5 %   Platelets 133 (L) 150 - 400 K/uL  Renal function panel     Status: Abnormal   Collection Time: 08/24/16  7:00 AM  Result Value Ref Range   Sodium 143 135 - 145 mmol/L   Potassium 3.7 3.5 - 5.1 mmol/L   Chloride 112 (H) 101 - 111 mmol/L   CO2 24 22 - 32 mmol/L   Glucose, Bld 118 (H) 65 - 99 mg/dL   BUN 11 6 - 20 mg/dL   Creatinine, Ser 1.36 (H) 0.61 - 1.24 mg/dL   Calcium 8.3 (L) 8.9 - 10.3 mg/dL   Phosphorus 1.9 (L) 2.5 - 4.6 mg/dL   Albumin 3.2 (L) 3.5 - 5.0 g/dL   GFR calc non Af Amer 47 (L) >60 mL/min   GFR calc Af Amer 54 (L) >60 mL/min    Comment: (NOTE) The eGFR has been calculated using the CKD EPI equation. This calculation has not been validated in all clinical situations. eGFR's persistently <60 mL/min signify possible Chronic Kidney Disease.    Anion gap 7 5 - 15  Phosphorus     Status: Abnormal   Collection Time: 08/24/16  7:00 AM  Result Value Ref Range   Phosphorus 1.9 (L) 2.5 - 4.6 mg/dL  Glucose, capillary     Status: Abnormal   Collection Time: 08/24/16  7:36 AM  Result Value Ref Range   Glucose-Capillary 104 (H) 65 - 99 mg/dL   Comment 1 Notify RN    Comment 2 Document in Chart   Glucose, capillary     Status: Abnormal   Collection Time: 08/24/16  4:59 PM  Result Value Ref Range   Glucose-Capillary 122 (H) 65 - 99 mg/dL   Comment 1 Notify RN    Comment 2 Document in Chart   CEA     Status: None   Collection Time: 08/24/16  5:42 PM  Result Value Ref Range   CEA 2.1 0.0 - 4.7 ng/mL    Comment: (NOTE)       Roche ECLIA methodology       Nonsmokers  <3.9  Smokers     <5.6 Performed At: [facility] [place], [place] [id number] Lindon Romp MD [id number]   Glucose, capillary     Status: Abnormal   Collection Time: 08/24/16 11:19 PM  Result Value Ref Range   Glucose-Capillary 108 (H) 65 - 99 mg/dL   Comment 1  Notify RN    Comment 2 Document in Chart   Phosphorus     Status: None   Collection Time: 08/25/16  3:24 AM  Result Value Ref Range   Phosphorus 2.9 2.5 - 4.6 mg/dL  CBC     Status: Abnormal   Collection Time: 08/25/16  3:24 AM  Result Value Ref Range   WBC 12.2 (H) 4.0 - 10.5 K/uL   RBC 3.52 (L) 4.22 - 5.81 MIL/uL   Hemoglobin 10.6 (L) 13.0 - 17.0 g/dL   HCT 31.0 (L) 39.0 - 52.0 %   MCV 88.1 78.0 - 100.0 fL   MCH 30.1 26.0 - 34.0 pg   MCHC 34.2 30.0 - 36.0 g/dL   RDW 14.5 11.5 - 15.5 %   Platelets 147 (L) 150 - 400 K/uL  Basic metabolic panel     Status: Abnormal   Collection Time: 08/25/16  3:24 AM  Result Value Ref Range   Sodium 140 135 - 145 mmol/L   Potassium 3.5 3.5 - 5.1 mmol/L   Chloride 111 101 - 111 mmol/L   CO2 23 22 - 32 mmol/L   Glucose, Bld 120 (H) 65 - 99 mg/dL   BUN 7 6 - 20 mg/dL   Creatinine, Ser 1.27 (H) 0.61 - 1.24 mg/dL   Calcium 8.2 (L) 8.9 - 10.3 mg/dL   GFR calc non Af Amer 51 (L) >60 mL/min   GFR calc Af Amer 59 (L) >60 mL/min    Comment: (NOTE) The eGFR has been calculated using the CKD EPI equation. This calculation has not been validated in all clinical situations. eGFR's persistently <60 mL/min signify possible Chronic Kidney Disease.    Anion gap 6 5 - 15  Glucose, capillary     Status: Abnormal   Collection Time: 08/25/16  6:41 AM  Result Value Ref Range   Glucose-Capillary 122 (H) 65 - 99 mg/dL   Comment 1 Notify RN    Comment 2 Document in Chart   Glucose, capillary     Status: Abnormal   Collection Time: 08/25/16  7:36 AM  Result Value Ref Range   Glucose-Capillary 116 (H) 65 - 99 mg/dL   Comment 1 Notify RN    Comment 2 Document in Chart     No results found.  Review of Systems  Cardiovascular: Negative for chest pain.  Gastrointestinal: Positive for blood in stool and melena. Negative for abdominal pain.   Blood pressure 139/67, pulse 71, temperature 98 F (36.7 C), temperature source Oral, resp. rate (!) 22, height 5'  11" (1.803 m), weight 93 kg (205 lb 0.4 oz), SpO2 97 %. Physical Exam  GENERAL APPEARANCE:  Overweight in NAD.  Pleasant and cooperative.  EARS, NOSE, MOUTH THROAT:  [place]/AT external ears:  no lesions or deformities external nose:  no lesions or deformities hearing:  grossly normal lips:  moist, no deformities EYES external: conjunctiva, lids, sclerae normal pupils:  equal, round glasses: no   CV ascultation:  RRR,  extremity edema:  no extremity varicosities:  no  RESP auscultation:  breath sounds equal and clear respiratory effort:  normal  BREASTS:  Symmetrical in size.  No dominant  masses, nipple discharge or suspicious skin lesions.  GASTROINTESTINAL abdomen:  Soft, non-tender, non-distended, no masses liver and spleen:  not enlarged. hernia:  none present scar:  none present   LYMPHATIC: No palpable cervical, supraclavicular adenopathy.  SKIN rash or lesion: none Jaundice:  no  NEUROLOGIC speech:  normal  PSYCHIATRIC alertness and orientation:  normal mood/affect/behavior:  normal judgement and insight:  normal   Assessment/Plan: 1. Lower GI bleeding-first episode felt to be secondary to sigmoid diverticular disease. This is the first time he's ever had a colonoscopy.  2. Bradley neoplasm suspicious for malignancy; pathology is pending.  3. Chronic atrial fibrillation with [place] score of 3.  Cardiology feels like he needs to be on lifelong anticoagulation for this. He is felt to be at moderate risk for abdominal surgery.  Recommendation: Would start IV heparin without a bolus. If he does not have any bleeding from this within 48 hours, could proceed with laparoscopic-assisted right colectomy on Monday. He does have recurrent diverticular bleeding, may need a subtotal colectomy. Will keep on full liquid diet for now.  I have explained the procedure and risks of colon resection.  Risks include but are not limited to bleeding, infection, wound problems,  anesthesia, anastomotic leak, need for colostomy, need for reoperative surgery,  injury to intraabominal organs (such as intestine, spleen, kidney, bladder, ureter, etc.), ileus, irregular bowel habits.  He seems to understand and agrees to proceed.  Bradley Short 08/25/2016, 9:55 AM

## 2016-08-25 NOTE — Consult Note (Signed)
Cardiology Consultation:   Patient ID: Habeeb Want; [id number]; 21-Dec-1932   Admit date: 08/21/2016 Date of Consult: 08/25/2016  Primary Care Provider: Manon Hilding, MD Primary Cardiologist: Dr. Percival Spanish Primary Electrophysiologist:  Dr. Rayann Heman   Patient Profile:   Abdulraheem Pineo is a 83 y.o. male with a hx of pAF s/p PVI by Dr. Rayann Heman. Atrial flutter s/p CTI ablation, hyperlipidemia, obstructive sleep apnea, non obstructed CAD 2000.  He is being seen today for the evaluation of preopeartive clearance for partial bowel ressection at the request of Dr. Zella Richer.  History of Present Illness:   Keyden Champoux has a history of atrial fibrillation/flutter, seen by Dr. Rayann Heman in the office last on January, 2018. At that time he was doing well,  Asymptomatic of atrial fibrillation. No CP, sob, dizziness, presyncope/syncope. His hypertension is stable. CHAD2VASC 3  (age/1, HTN/1, CHF/1)-- takes Xarelto last dose 08/20/2016.  He has been admitted to the hospital, transferred from [facility] for rectal bleeding. [facility] consulted and did a colonoscopy on 6/28 which showed a cecal mass that could not be removed endoscopically, therefore the lesion was biopsied and surgery consulted. Per GI -- his anticoagulation could be cautiously restarted at cardiologies discretion. General surgery saw patient yesterday, he reports needing to speak with GI to discuss where bleeding might be coming from.  Per the patient, prior to his gi bleeding he has been feeling well, without chest pains, dyspnea on exertion, orthopnea or lower extremity swelling. He does have episodes where he can tell that he is in atrial fib/flutter and feels "abnormal" at that time.  6/28: ECHO EF 60-65%, no wall motion abnormality. Grade 2 diastolic dysfunction, normal aortic valve  Past Medical History:  Diagnosis Date  . Atrial fibrillation (HCC)    paroxysmal, s/p PVI by Dr Rayann Heman 8/12  . Atrial flutter  ([place])    s/p CTI ablation  . Hyperlipidemia   . Obstructive sleep apnea (adult) (pediatric)    on CPAP    Past Surgical History:  Procedure Laterality Date  . ATRIAL ABLATION SURGERY     s/p CTI and PVI ablations by Dr Rayann Heman 8/12  . CARDIAC CATHETERIZATION     nonobstructive CAD 2000  . melanoma (other)     resected from his right shoulder     Inpatient Medications: Scheduled Meds: . allopurinol  100 mg Oral Daily  . pantoprazole (PROTONIX) IV  40 mg Intravenous QHS   Continuous Infusions:  PRN Meds: acetaminophen **OR** acetaminophen, LORazepam, ondansetron **OR** ondansetron (ZOFRAN) IV  Allergies:    Allergies  Allergen Reactions  . Diltiazem Hcl     REACTION: Looked like burned all over  . Lisinopril     REACTION: Looked like burned all over  . Metoprolol Succinate     REACTION: Looked like burned all over  . Propafenone Hcl     REACTION: Looked like burned all over    Social History:   Social History   Social History  . Marital status: Married    Spouse name: N/A  . Number of children: N/A  . Years of education: N/A   Occupational History  . Not on file.   Social History Main Topics  . Smoking status: Never Smoker  . Smokeless tobacco: Never Used  . Alcohol use No  . Drug use: No  . Sexual activity: Not on file   Other Topics Concern  . Not on file   Social History Narrative   Lives in [place].  Retired Warden/ranger    Family History:   The patient's family history includes Cancer in his other; Diabetes in his other; Heart disease in his other; Prostate cancer in his father; Seizures in his other.  ROS:  Please see the history of present illness.  All other ROS reviewed and negative.     Physical Exam/Data:   Vitals:   08/25/16 0400 08/25/16 0600 08/25/16 0750 08/25/16 0800  BP: (!) 114/51 (!) 143/54  139/67  Pulse: 72 75  71  Resp: 19 12  (!) 22  Temp:   98 F (36.7 C)   TempSrc:   Oral   SpO2: 94% 94%  97%  Weight:      Height:         Intake/Output Summary (Last 24 hours) at 08/25/16 0903 Last data filed at 08/25/16 0800  Gross per 24 hour  Intake          1491.67 ml  Output             1900 ml  Net          -408.33 ml   Filed Weights   08/21/16 2200  Weight: 205 lb 0.4 oz (93 kg)   Body mass index is 28.6 kg/m.  General: Well developed, well nourished, in no acute distress. + frail appearing Head: Normocephalic, atraumatic, sclera non-icteric, no xanthomas, nares are without discharge.  Neck: Negative for carotid bruits. JVD not elevated. Lungs: Clear bilaterally to auscultation without wheezes, rales, or rhonchi. Breathing is unlabored. Heart: RRR with S1 S2. No murmurs, rubs, or gallops appreciated. Abdomen: Soft, non-tender, non-distended with normoactive bowel sounds. No hepatomegaly. No rebound/guarding. No obvious abdominal masses. Msk:  Strength and tone appear normal for age. Extremities: No clubbing or cyanosis. No edema.  Distal pedal pulses are 2+ and equal bilaterally. Neuro: Alert and oriented X 3. No facial asymmetry. No focal deficit. Moves all extremities spontaneously. Psych:  Responds to questions appropriately with a normal affect.  EKG:  The EKG was personally reviewed and demonstrates HR 71, sinus rhythm with first degree AV block.  Relevant CV Studies:  Transthoracic echocardiogram (08/23/2016) Study Conclusions  - Procedure narrative: Transthoracic echocardiography. Image   quality was poor. The study was technically difficult, as a   result of poor sound wave transmission. - Left ventricle: The cavity size was normal. Wall thickness was   increased in a pattern of mild LVH. Systolic function was normal.   The estimated ejection fraction was in the range of 60% to 65%.   Although no diagnostic regional wall motion abnormality was   identified, this possibility cannot be completely excluded on the   basis of this study. Features are consistent with a pseudonormal   left  ventricular filling pattern, with concomitant abnormal   relaxation and increased filling pressure (grade 2 diastolic   dysfunction). - Aortic valve: There was no stenosis. - Mitral valve: Mildly calcified annulus. There was trivial   regurgitation. - Left atrium: The atrium was mildly to moderately dilated. - Right ventricle: The cavity size was normal. Systolic function   was normal. - Pulmonary arteries: No complete TR doppler jet so unable to   estimate PA systolic pressure. - Inferior vena cava: The vessel was normal in size. The   respirophasic diameter changes were in the normal range (>= 50%),   consistent with normal central venous pressure.  Impressions:  - Normal LV size with mild LV hypertrophy. EF 60-65%. Moderate   diastolic dysfunction. Normal RV  size and systolic function.   Moderate LAE. No significant valvular abnormalities.  Laboratory Data:  Chemistry Recent Labs Lab 08/23/16 0916 08/24/16 0700 08/25/16 0324  NA 140 143 140  K 3.7 3.7 3.5  CL 109 112* 111  CO2 22 24 23   GLUCOSE 172* 118* 120*  BUN 17 11 7   CREATININE 1.48* 1.36* 1.27*  CALCIUM 8.4* 8.3* 8.2*  GFRNONAA 42* 47* 51*  GFRAA 49* 54* 59*  ANIONGAP 9 7 6      Recent Labs Lab 08/21/16 2303 08/23/16 0916 08/24/16 0700  PROT 6.3* 6.3*  --   ALBUMIN 3.3* 3.5 3.2*  AST 29 31  --   ALT 19 18  --   ALKPHOS 53 56  --   BILITOT 0.9 0.5  --    Hematology Recent Labs Lab 08/23/16 1738 08/24/16 0700 08/25/16 0324  WBC 11.1* 9.7 12.2*  RBC 3.55* 3.39* 3.52*  HGB 10.6* 10.2* 10.6*  HCT 31.6* 30.2* 31.0*  MCV 89.0 89.1 88.1  MCH 29.9 30.1 30.1  MCHC 33.5 33.8 34.2  RDW 14.5 14.6 14.5  PLT 135* 133* 147*   Cardiac EnzymesNo results for input(s): TROPONINI in the last 168 hours. No results for input(s): TROPIPOC in the last 168 hours.  BNPNo results for input(s): BNP, PROBNP in the last 168 hours.  DDimer No results for input(s): DDIMER in the last 168 hours.  Radiology/Studies:    No results found.  Assessment and Plan:   1. Paroxysmal Atrial fibrillation with RVR: now in sinus. Patient has been off of his anticoagulation since 08/20/2016 because of GI bleeding, gastroenterologist advises that he can restart his anticoagulation at our discretion. I recommend starting him on heparin for now, since he has had recent bleed and may be going to surgery soon. CHAD2VASC 3 (age/1, HTN/1, CHF/1)  2. Gi bleeding and cecal mass: Patient has not been having anginal symptoms. 6/28: ECHO EF 60-65%, no wall motion abnormality. Grade 2 diastolic dysfunction, normal aortic valve.  I would put him at moderate risk, mostly due to age. Her underwent MAC anesthesia without any adverse events.   Kristopher Glee, PA-C  08/25/2016 9:03 AM   Attending Note:   The patient was seen and examined.  Agree with assessment and plan as noted above.  Changes made to the above note as needed.  Patient seen and independently examined with Delos Haring, PA .   We discussed all aspects of the encounter. I agree with the assessment and plan as stated above.  1.  Paroxysmal atrial fib:   Was admitted with rapid atrial fib .  Now is back in NSR Is off Xarelto.   Is back on heparin . GI and Dr. Zella Richer are giving a heparin challenge to see if he has additional colon lesions that are bleeding .  From a cardiology standpoint, he is at low-moderate risk for his partial colectomy.     I have spent a total of 40 minutes with patient reviewing hospital  notes , telemetry, EKGs, labs and examining patient as well as establishing an assessment and plan that was discussed with the patient. > 50% of time was spent in direct patient care.    Thayer Headings, Brooke Bonito., MD, [facility] 08/25/2016, 10:45 AM [address]  [place] Pager [phone]

## 2016-08-25 NOTE — Progress Notes (Signed)
Pharmacy: phosphorus replacement  Patient's an 83 y.o M admitted to [facility] with rectal bleeding.  Pharmacy to assist with phosphorous replacement.  Today, 08/25/2016: - phosphorus level therapeutic (2.9) - K 3.5, Na 140 - scr improved 1.27 - CorrCA: 8.84  Plan: - No further phosphorus repletion needed at this time.  Pharmacy to sign off.  Please re-consult if needed.   Netta Cedars, PharmD, BCPS 08/25/2016 8:36 AM

## 2016-08-25 NOTE — Progress Notes (Signed)
PROGRESS NOTE   Bradley Short  GMW:[id number]    DOB: October 30, 1932    DOA: 08/21/2016  PCP: Manon Hilding, MD   I have briefly reviewed patients previous medical records in [place].  Brief Narrative:  83 year old male with PMH of PAF on Xarelto, HLD, OSA on CPAP, gout, presented with multiple episodes of painless BRBPR, weakness, diaphoresis, went to [facility] ED where hemoglobin around 12 g, initially A. fib with RVR which improved with IV fluids, CT abdomen and pelvis showed sigmoid diverticulosis without acute findings, transferred to [facility] stepdown unit. Hemodynamically and hemoglobin stable. [facility] consulted and patient underwent colonoscopy 6/28. Cecal lesion noted-Gen. surgery consulted and plan possible surgery 7/2. Cardiology provided preop clearance.  Assessment & Plan:   Principal Problem:   Acute GI bleeding Active Problems:   Pure hypercholesterolemia   ATRIAL FIBRILLATION   Acute blood loss anemia   Gout   Leukocytosis   Thrombocytopenia (HCC)   CKD (chronic kidney disease), stage III   Syncope, vasovagal   1. Acute GI bleed, suspect LGIB:  Xarelto held (last dose 08/20/16). [facility] was consulted and after holding anticoagulation for a couple days, performed colonoscopy-detailed results as below. As per GI follow-up, bleeding source likely diverticular in origin and has clinically resolved. Cecal mass noted-management as below. Outpatient follow-up with [facility]. 2. Cecal mass: Not amenable to remove endoscopically. GI and general surgery input appreciated and this will need to be removed surgically. Biopsies pending. General surgery plans laparoscopic assisted right colectomy on 7/2. 3. Paroxysmal atrial fibrillation: Currently in sinus rhythm. Not on rate control medications PTA. Xarelto on hold. As per surgery recommendations, now on IV heparin without a bolus to determine extent of surgery (right colectomy versus subtotal  colectomy). Cardiology has seen and indicate low to moderate risk for surgery. 4. Acute blood loss anemia: Secondary to GI bleed. Stable. 5. Syncope: Suspect vasovagal due to acute GI bleed. Telemetry without significant arrhythmias as cause. 2-D echo: LVEF 60-65 percent. Moderate diastolic dysfunction. No significant valvular abnormalities. Mobilize. PT evaluation. 6. Hyperlipidemia: Resumed medications post colonoscopy. 7. Gout: No acute flare. Continue allopurinol. 8. Stage III chronic kidney disease: Baseline creatinine not known. Stable. 9. Thrombocytopenia: Secondary to acute blood loss. Slowly improving. 10. Leukocytosis: Likely stress response. Resolved. 11. Hypophosphatemia: Replaced.   DVT prophylaxis: SCDs. Now on IV heparin infusion per pharmacy. Code Status: Full Family Communication: Discussed in detail with patient's spouse at bedside. Disposition: Transfer to medical bed 6/29.  Consultants:  [facility]  Gen. Surgery Cardiology  Procedures:  Colonoscopy 08/24/16: Impression:               - Preparation of the colon was fair.                           - One 7 mm polyp in the cecum, removed with a hot                            snare. Complete resection. Polyp tissue not                            retrieved.                           - Likely malignant tumor in the cecum and at  the                            appendiceal orifice. Biopsied. Tattooed.                           - Diverticulosis in the sigmoid colon, in the                            descending colon and at the splenic flexure.                           - Non-bleeding external hemorrhoids.                           - The examination was otherwise normal.  Antimicrobials:  None    Subjective: Seen this morning. No BM since colonoscopy. Continues to feel weak but no pain reported. Has not been out of bed so cannot comment on dizziness or lightheadedness.  ROS: No chest pain, dyspnea or  palpitations.  Objective:  Vitals:   08/25/16 1200 08/25/16 1400 08/25/16 1514 08/25/16 1522  BP: (!) 128/47 (!) 152/55 (!) 157/66   Pulse: 78 75 83   Resp: 18 (!) 22 (!) 21   Temp:    99.3 F (37.4 C)  TempSrc:    Axillary  SpO2: 95% 97% 100%   Weight:      Height:        Examination:  General exam: Pleasant male lying comfortably in bed. Respiratory system: Clear to auscultation. No increased work of breathing. Cardiovascular system: S1 and S2 heard, RRR. No JVD, murmurs or pedal edema. Telemetry: Sinus rhythm with first-degree AV block.  Gastrointestinal system: Abdomen is nondistended, soft and nontender. Normal bowel sounds heard. Stable without change.  Central nervous system: Alert and oriented. No focal neurological deficits. Stable without change. Extremities: Symmetric 5 x 5 power. Stable Skin: No rashes, lesions or ulcers Psychiatry: Judgement and insight appear normal. Mood & affect appropriate. Stable    Data Reviewed: I have personally reviewed following labs and imaging studies  CBC:  Recent Labs Lab 08/22/16 0915 08/23/16 0916 08/23/16 1738 08/24/16 0700 08/25/16 0324  WBC 12.5* 11.0* 11.1* 9.7 12.2*  NEUTROABS  --  9.1*  --   --   --   HGB 10.8* 11.1* 10.6* 10.2* 10.6*  HCT 31.8* 33.4* 31.6* 30.2* 31.0*  MCV 89.8 89.3 89.0 89.1 88.1  PLT 139* 134* 135* 133* 660*   Basic Metabolic Panel:  Recent Labs Lab 08/21/16 2303 08/22/16 0302 08/23/16 0916 08/24/16 0700 08/25/16 0324  NA 138 141 140 143 140  K 4.1 3.9 3.7 3.7 3.5  CL 108 112* 109 112* 111  CO2 23 22 22 24 23   GLUCOSE 151* 124* 172* 118* 120*  BUN 18 19 17 11 7   CREATININE 1.34* 1.35* 1.48* 1.36* 1.27*  CALCIUM 8.3* 8.4* 8.4* 8.3* 8.2*  MG  --   --  2.1  --   --   PHOS  --   --  1.9* 1.9*  1.9* 2.9   Liver Function Tests:  Recent Labs Lab 08/21/16 2303 08/23/16 0916 08/24/16 0700  AST 29 31  --   ALT 19 18  --   ALKPHOS 53 56  --   BILITOT 0.9 0.5  --  PROT 6.3*  6.3*  --   ALBUMIN 3.3* 3.5 3.2*   Coagulation Profile:  Recent Labs Lab 08/21/16 2303  INR 1.03   CBG:  Recent Labs Lab 08/24/16 1659 08/24/16 2319 08/25/16 0641 08/25/16 0736 08/25/16 1521  GLUCAP 122* 108* 122* 116* 123*    Recent Results (from the past 240 hour(s))  MRSA PCR Screening     Status: None   Collection Time: 08/21/16 10:20 PM  Result Value Ref Range Status   MRSA by PCR NEGATIVE NEGATIVE Final    Comment:        The GeneXpert MRSA Assay (FDA approved for NASAL specimens only), is one component of a comprehensive MRSA colonization surveillance program. It is not intended to diagnose MRSA infection nor to guide or monitor treatment for MRSA infections.          Radiology Studies: No results found.      Scheduled Meds: . allopurinol  100 mg Oral Daily  . feeding supplement (ENSURE ENLIVE)  237 mL Oral BID BM  . pantoprazole (PROTONIX) IV  40 mg Intravenous QHS   Continuous Infusions: . heparin 900 Units/hr (08/25/16 1338)     LOS: 4 days     Dontavis Tschantz, MD, FACP, FHM. [facility] Pager [phone]  If 7PM-7AM, please contact night-coverage [email or web address] Password [email or web address] 08/25/2016, 4:10 PM

## 2016-08-26 ENCOUNTER — Encounter (HOSPITAL_COMMUNITY): Payer: Self-pay

## 2016-08-26 LAB — CBC
HCT: 31.5 % — ABNORMAL LOW (ref 39.0–52.0)
Hemoglobin: 10.6 g/dL — ABNORMAL LOW (ref 13.0–17.0)
MCH: 30 pg (ref 26.0–34.0)
MCHC: 33.7 g/dL (ref 30.0–36.0)
MCV: 89.2 fL (ref 78.0–100.0)
Platelets: 152 10*3/uL (ref 150–400)
RBC: 3.53 MIL/uL — ABNORMAL LOW (ref 4.22–5.81)
RDW: 14.6 % (ref 11.5–15.5)
WBC: 9.7 10*3/uL (ref 4.0–10.5)

## 2016-08-26 LAB — HEPARIN LEVEL (UNFRACTIONATED)
HEPARIN UNFRACTIONATED: 0.44 [IU]/mL (ref 0.30–0.70)
Heparin Unfractionated: 0.28 IU/mL — ABNORMAL LOW (ref 0.30–0.70)

## 2016-08-26 LAB — BASIC METABOLIC PANEL
ANION GAP: 7 (ref 5–15)
BUN: 10 mg/dL (ref 6–20)
CO2: 24 mmol/L (ref 22–32)
Calcium: 8.5 mg/dL — ABNORMAL LOW (ref 8.9–10.3)
Chloride: 109 mmol/L (ref 101–111)
Creatinine, Ser: 1.33 mg/dL — ABNORMAL HIGH (ref 0.61–1.24)
GFR calc Af Amer: 56 mL/min — ABNORMAL LOW (ref >60)
GFR, EST NON AFRICAN AMERICAN: 48 mL/min — AB (ref >60)
GLUCOSE: 108 mg/dL — AB (ref 65–99)
POTASSIUM: 3.5 mmol/L (ref 3.5–5.1)
Sodium: 140 mmol/L (ref 135–145)

## 2016-08-26 NOTE — Progress Notes (Signed)
PROGRESS NOTE   Bradley Short  WJX:[id number]    DOB: 06-17-1932    DOA: 08/21/2016  PCP: Manon Hilding, MD   I have briefly reviewed patients previous medical records in [place].  Brief Narrative:  83 year old male with PMH of PAF on Xarelto, HLD, OSA on CPAP, gout, presented with multiple episodes of painless BRBPR, weakness, diaphoresis, went to [facility] ED where hemoglobin around 12 g, initially A. fib with RVR which improved with IV fluids, CT abdomen and pelvis showed sigmoid diverticulosis without acute findings, transferred to [facility] stepdown unit. Hemodynamically and hemoglobin stable. [facility] consulted and patient underwent colonoscopy 6/28. Cecal lesion noted-Gen. surgery consulted and plan possible surgery 7/2. Cardiology provided preop clearance.  Assessment & Plan:   Principal Problem:   Acute GI bleeding Active Problems:   Pure hypercholesterolemia   ATRIAL FIBRILLATION   Acute blood loss anemia   Gout   Leukocytosis   Thrombocytopenia (HCC)   CKD (chronic kidney disease), stage III   Syncope, vasovagal   1. Acute GI bleed, suspect LGIB:  Xarelto held (last dose 08/20/16). [facility] was consulted and after holding anticoagulation for a couple days, performed colonoscopy-detailed results as below. As per GI follow-up, bleeding source likely diverticular in origin and has clinically resolved. Cecal mass noted-management as below. Outpatient follow-up with [facility]. 2. Cecal mass: Not amenable to remove endoscopically. GI and general surgery input appreciated and this will need to be removed surgically. 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 General surgery plans laparoscopic assisted right colectomy on 7/2. Discussed pathology report and implications in detail with patient and spouse. 3. Paroxysmal atrial fibrillation: Currently in sinus rhythm. Not on rate control medications PTA.  Xarelto on hold. As per surgery recommendations, now on IV heparin without a bolus to determine extent of surgery (right colectomy versus subtotal colectomy). Cardiology has seen and indicate low to moderate risk for surgery. Tolerating heparin without bleeding. 4. Acute blood loss anemia: Secondary to GI bleed. Stable. 5. Syncope: Suspect vasovagal due to acute GI bleed. Telemetry without significant arrhythmias as cause. 2-D echo: LVEF 60-65 percent. Moderate diastolic dysfunction. No significant valvular abnormalities. Mobilize. PT recommends no follow-up at this time. 6. Hyperlipidemia: Resumed medications post colonoscopy. 7. Gout: No acute flare. Continue allopurinol. 8. Stage III chronic kidney disease: Baseline creatinine not known. Creatinine stable in the 1.2-1.3 range. 9. Thrombocytopenia: Secondary to acute blood loss. Resolved. 10. Leukocytosis: Likely stress response. Resolved. 11. Hypophosphatemia: Replaced.   DVT prophylaxis: SCDs. Now on IV heparin infusion per pharmacy. Code Status: Full Family Communication: Discussed in detail with patient's spouse at bedside. Disposition: Transferred to medical bed 6/29. To be determined postop.  Consultants:  [facility]  Gen. Surgery Cardiology  Procedures:  Colonoscopy 08/24/16: Impression:               - Preparation of the colon was fair.                           - One 7 mm polyp in the cecum, removed with a hot                            snare. Complete resection. Polyp tissue not  retrieved.                           - Likely malignant tumor in the cecum and at the                            appendiceal orifice. Biopsied. Tattooed.                           - Diverticulosis in the sigmoid colon, in the                            descending colon and at the splenic flexure.                           - Non-bleeding external hemorrhoids.                           - The examination was otherwise  normal.  Pathology results:  Diagnosis Ileocecal valve, biopsy, mass - SESSILE SERRATED POLYP WITH HIGH GRADE DYSPLASIA AND SMALL FOCUS SUSPICIOUS FOR ADENOCARCINOMA.  Antimicrobials:  None    Subjective: No BM since colonoscopy. Tolerating diet. No dizziness or lightheadedness. States that he had some intolerance to "boost" when he developed some redness of face and flushing. Does not want to try to gain until postop.  ROS: No chest pain, dyspnea or palpitations.  Objective:  Vitals:   08/25/16 1640 08/25/16 1908 08/26/16 0430 08/26/16 1329  BP: (!) 137/59 129/74 (!) 136/59 122/88  Pulse: 67 69 70 69  Resp: 18 18 16 16   Temp: 98.3 F (36.8 C) 98.7 F (37.1 C) 97.5 F (36.4 C) 97.6 F (36.4 C)  TempSrc: Oral Oral Oral Axillary  SpO2: 100% 100% 98% 100%  Weight: 93.3 kg (205 lb 11 oz)  92.6 kg (204 lb 3.2 oz)   Height: 5\' 11"  (1.803 m)  5\' 11"  (1.803 m)     Examination:  General exam: Pleasant elderly male sitting up in bed eating breakfast this morning. Respiratory system: Clear to auscultation. No increased work of breathing. Cardiovascular system: S1 and S2 heard, RRR. No JVD, murmurs or pedal edema. Telemetry: Sinus rhythm with first-degree AV block. No change/stable. Gastrointestinal system: Abdomen is nondistended, soft and nontender. Normal bowel sounds heard. Stable. Central nervous system: Alert and oriented. No focal neurological deficits. Stable.. Extremities: Symmetric 5 x 5 power. Stable Skin: No rashes, lesions or ulcers Psychiatry: Judgement and insight appear normal. Mood & affect appropriate. Stable    Data Reviewed: I have personally reviewed following labs and imaging studies  CBC:  Recent Labs Lab 08/23/16 0916 08/23/16 1738 08/24/16 0700 08/25/16 0324 08/26/16 0806  WBC 11.0* 11.1* 9.7 12.2* 9.7  NEUTROABS 9.1*  --   --   --   --   HGB 11.1* 10.6* 10.2* 10.6* 10.6*  HCT 33.4* 31.6* 30.2* 31.0* 31.5*  MCV 89.3 89.0 89.1 88.1 89.2  PLT  134* 135* 133* 147* 854   Basic Metabolic Panel:  Recent Labs Lab 08/22/16 0302 08/23/16 0916 08/24/16 0700 08/25/16 0324 08/26/16 0806  NA 141 140 143 140 140  K 3.9 3.7 3.7 3.5 3.5  CL 112* 109 112* 111 109  CO2 22 22 24 23 24   GLUCOSE 124* 172* 118* 120* 108*  BUN 19 17 11 7 10   CREATININE 1.35* 1.48* 1.36* 1.27* 1.33*  CALCIUM 8.4* 8.4* 8.3* 8.2* 8.5*  MG  --  2.1  --   --   --   PHOS  --  1.9* 1.9*  1.9* 2.9  --    Liver Function Tests:  Recent Labs Lab 08/21/16 2303 08/23/16 0916 08/24/16 0700  AST 29 31  --   ALT 19 18  --   ALKPHOS 53 56  --   BILITOT 0.9 0.5  --   PROT 6.3* 6.3*  --   ALBUMIN 3.3* 3.5 3.2*   Coagulation Profile:  Recent Labs Lab 08/21/16 2303  INR 1.03   CBG:  Recent Labs Lab 08/24/16 1659 08/24/16 2319 08/25/16 0641 08/25/16 0736 08/25/16 1521  GLUCAP 122* 108* 122* 116* 123*    Recent Results (from the past 240 hour(s))  MRSA PCR Screening     Status: None   Collection Time: 08/21/16 10:20 PM  Result Value Ref Range Status   MRSA by PCR NEGATIVE NEGATIVE Final    Comment:        The GeneXpert MRSA Assay (FDA approved for NASAL specimens only), is one component of a comprehensive MRSA colonization surveillance program. It is not intended to diagnose MRSA infection nor to guide or monitor treatment for MRSA infections.          Radiology Studies: No results found.      Scheduled Meds: . allopurinol  100 mg Oral Daily  . feeding supplement (ENSURE ENLIVE)  237 mL Oral BID BM   Continuous Infusions: . heparin 1,200 Units/hr (08/26/16 1402)     LOS: 5 days   Time spent: 30 minutes of which 50% of the time was spent in counseling patient and family in updating them on pathology results, possible next steps and answering their questions.  Vernell Leep, MD, FACP, FHM. [facility] Pager [phone]  If 7PM-7AM, please contact night-coverage [email or web address] Password [email or web address] 08/26/2016, 2:12  PM

## 2016-08-26 NOTE — Progress Notes (Signed)
2 Days Post-Op   Subjective/Chief Complaint: No complaints. No further bleeding. bx shows high grade dysplasia of cecal mass with suspicious areas of invasion   Objective: Vital signs in last 24 hours: Temp:  [97.5 F (36.4 C)-99.3 F (37.4 C)] 97.5 F (36.4 C) (06/30 0430) Pulse Rate:  [67-83] 70 (06/30 0430) Resp:  [16-22] 16 (06/30 0430) BP: (128-157)/(47-74) 136/59 (06/30 0430) SpO2:  [95 %-100 %] 98 % (06/30 0430) Weight:  [92.6 kg (204 lb 3.2 oz)-93.3 kg (205 lb 11 oz)] 92.6 kg (204 lb 3.2 oz) (06/30 0430) Last BM Date: 08/25/16  Intake/Output from previous day: 06/29 0701 - 06/30 0700 In: 454.4 [P.O.:360; I.V.:94.4] Out: 500 [Urine:500] Intake/Output this shift: Total I/O In: 480 [P.O.:480] Out: 275 [Urine:275]  General appearance: alert and cooperative Resp: clear to auscultation bilaterally Cardio: regular rate and rhythm GI: soft, non-tender; bowel sounds normal; no masses,  no organomegaly  Lab Results:   Recent Labs  08/25/16 0324 08/26/16 0806  WBC 12.2* 9.7  HGB 10.6* 10.6*  HCT 31.0* 31.5*  PLT 147* 152   BMET  Recent Labs  08/25/16 0324 08/26/16 0806  NA 140 140  K 3.5 3.5  CL 111 109  CO2 23 24  GLUCOSE 120* 108*  BUN 7 10  CREATININE 1.27* 1.33*  CALCIUM 8.2* 8.5*   PT/INR No results for input(s): LABPROT, INR in the last 72 hours. ABG No results for input(s): PHART, HCO3 in the last 72 hours.  Invalid input(s): PCO2, PO2  Studies/Results: No results found.  Anti-infectives: Anti-infectives    None      Assessment/Plan: s/p Procedure(s): COLONOSCOPY WITH PROPOFOL (N/A) continue liquid diet over weekend  Continue IV heparin Plan for lap assisted right colectomy on monday  LOS: 5 days    TOTH III,PAUL S 08/26/2016

## 2016-08-26 NOTE — Progress Notes (Addendum)
ANTICOAGULATION CONSULT NOTE - Follow-up  Pharmacy Consult for Heparin Indication: atrial fibrillation, bridge while off Xarelto  Allergies  Allergen Reactions  . Diltiazem Hcl     REACTION: Looked like burned all over  . Lisinopril     REACTION: Looked like burned all over  . Metoprolol Succinate     REACTION: Looked like burned all over  . Propafenone Hcl     REACTION: Looked like burned all over    Patient Measurements: Height: 5\' 11"  (180.3 cm) Weight: 204 lb 3.2 oz (92.6 kg) IBW/kg (Calculated) : 75.3 Heparin Dosing Weight: 93kg  Vital Signs: Temp: 97.6 F (36.4 C) (06/30 1329) Temp Source: Axillary (06/30 1329) BP: 122/88 (06/30 1329) Pulse Rate: 69 (06/30 1329)  Labs:  Recent Labs  08/24/16 0700 08/25/16 0324 08/25/16 2119 08/26/16 0806 08/26/16 1619  HGB 10.2* 10.6*  --  10.6*  --   HCT 30.2* 31.0*  --  31.5*  --   PLT 133* 147*  --  152  --   APTT  --   --  46*  --   --   HEPARINUNFRC  --   --  0.16* 0.28* 0.44  CREATININE 1.36* 1.27*  --  1.33*  --     Estimated Creatinine Clearance: 49.8 mL/min (A) (by C-G formula based on SCr of 1.33 mg/dL (H)).   Medical History: Past Medical History:  Diagnosis Date  . Atrial fibrillation (HCC)    paroxysmal, s/p PVI by Dr Rayann Heman 8/12  . Atrial flutter ([place])    s/p CTI ablation  . Hyperlipidemia   . Obstructive sleep apnea (adult) (pediatric)    on CPAP    Medications:  Infusions:  . heparin 1,200 Units/hr (08/26/16 1402)   Xarelto 20mg  daily PTA- LD: 08/20/16  Assessment: 84 yoM on chronic anticoagulation for Afib.  He was admitted with BRBPR on 6/25.  Xarelto was held and he had colonoscopy on 6/28.  No active bleeding found, but incidentally found cecal mass that was suspicious for malignancy (bx results pending).  Surgery was consulted for removal which is tentatively planned for Monday.  Pharmacy is consulted to bridge with heparin in the interim per cardiology recommendations.      Today: -  2nd heparin level today is now within target range on 1200units/hr.  - CBC stable. No bleeding reported/documented. - SCr remains slightly elevated.  Goal of Therapy:  Heparin level 0.3-0.7 units/ml - but aiming for lower end of range due to risk of bleeding. Monitor platelets by anticoagulation protocol: Yes   Plan:  Cont heparin infusion at 1200units/hr. Check heparin level daily, next in the AM. Check CBC q24h while on heparin. Tentatively, hold heparin 8h pre-procedure. Surgeon will finalize plans over weekend.    Romeo Rabon, PharmD, pager [phone]. 08/26/2016,5:28 PM.

## 2016-08-27 ENCOUNTER — Inpatient Hospital Stay (HOSPITAL_COMMUNITY): Payer: Medicare Other

## 2016-08-27 LAB — CBC
HCT: 32.5 % — ABNORMAL LOW (ref 39.0–52.0)
HEMATOCRIT: 30.2 % — AB (ref 39.0–52.0)
HEMOGLOBIN: 11 g/dL — AB (ref 13.0–17.0)
Hemoglobin: 10.2 g/dL — ABNORMAL LOW (ref 13.0–17.0)
MCH: 29.8 pg (ref 26.0–34.0)
MCH: 29.6 pg (ref 26.0–34.0)
MCHC: 33.8 g/dL (ref 30.0–36.0)
MCHC: 33.8 g/dL (ref 30.0–36.0)
MCV: 88.3 fL (ref 78.0–100.0)
MCV: 87.6 fL (ref 78.0–100.0)
Platelets: 161 10*3/uL (ref 150–400)
Platelets: 188 10*3/uL (ref 150–400)
RBC: 3.42 MIL/uL — ABNORMAL LOW (ref 4.22–5.81)
RBC: 3.71 MIL/uL — AB (ref 4.22–5.81)
RDW: 14.5 % (ref 11.5–15.5)
RDW: 14.4 % (ref 11.5–15.5)
WBC: 8.9 10*3/uL (ref 4.0–10.5)
WBC: 9.5 10*3/uL (ref 4.0–10.5)

## 2016-08-27 LAB — HEPARIN LEVEL (UNFRACTIONATED): Heparin Unfractionated: 0.38 IU/mL (ref 0.30–0.70)

## 2016-08-27 LAB — TYPE AND SCREEN
ABO/RH(D): O NEG
ANTIBODY SCREEN: NEGATIVE

## 2016-08-27 LAB — LACTIC ACID, PLASMA: LACTIC ACID, VENOUS: 1.2 mmol/L (ref 0.5–1.9)

## 2016-08-27 MED ORDER — SODIUM CHLORIDE 0.9 % IV BOLUS (SEPSIS)
1000.0000 mL | Freq: Once | INTRAVENOUS | Status: AC
  Administered 2016-08-27: 1000 mL via INTRAVENOUS

## 2016-08-27 NOTE — Progress Notes (Signed)
PROGRESS NOTE   Bradley Short  MPN:[id number]    DOB: January 23, 1933    DOA: 08/21/2016  PCP: Manon Hilding, MD   I have briefly reviewed patients previous medical records in [place].  Brief Narrative:  83 year old male with PMH of PAF on Xarelto, HLD, OSA on CPAP, gout, presented with multiple episodes of painless BRBPR, weakness, diaphoresis, went to [facility] ED where hemoglobin around 12 g, initially A. fib with RVR which improved with IV fluids, CT abdomen and pelvis showed sigmoid diverticulosis without acute findings, transferred to [facility] stepdown unit. Hemodynamically and hemoglobin stable. [facility] consulted and patient underwent colonoscopy 6/28. Cecal lesion noted-Gen. surgery consulted and plan possible surgery 7/2. Cardiology provided preop clearance.  Assessment & Plan:   Principal Problem:   Acute GI bleeding Active Problems:   Pure hypercholesterolemia   ATRIAL FIBRILLATION   Acute blood loss anemia   Gout   Leukocytosis   Thrombocytopenia (HCC)   CKD (chronic kidney disease), stage III   Syncope, vasovagal   1. Acute GI bleed, suspect LGIB:  Xarelto held (last dose 08/20/16). [facility] was consulted and after holding anticoagulation for a couple days, performed colonoscopy-detailed results as below. As per GI follow-up, bleeding source likely diverticular in origin and has clinically resolved. Cecal mass noted-management as below. Outpatient follow-up with [facility]. 2. Cecal mass: Not amenable to remove endoscopically. GI and general surgery input appreciated and this will need to be removed surgically. 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 General surgery plans laparoscopic assisted right colectomy on 7/2. Preop CT abdomen without contrast 7/1: Sigmoid diverticulosis without acute diverticulitis. No acute findings. 3. Paroxysmal atrial fibrillation: Remains in sinus rhythm. Not on rate  control medications PTA. Xarelto on hold. As per surgery recommendations, now on IV heparin without a bolus to determine extent of surgery (right colectomy versus subtotal colectomy). Cardiology has seen and indicate low to moderate risk for surgery. Tolerating heparin without bleeding. 4. Acute blood loss anemia: Secondary to GI bleed. Stable. 5. Syncope: Suspect vasovagal due to acute GI bleed. Telemetry without significant arrhythmias as cause. 2-D echo: LVEF 60-65 percent. Moderate diastolic dysfunction. No significant valvular abnormalities. Mobilize. PT recommends no follow-up at this time. 6. Hyperlipidemia: Resumed medications post colonoscopy. 7. Gout: No acute flare. Continue allopurinol. 8. Stage III chronic kidney disease: Baseline creatinine not known. Creatinine stable in the 1.2-1.3 range. 9. Thrombocytopenia: Secondary to acute blood loss. Resolved. 10. Leukocytosis: Likely stress response. Resolved. 11. Hypophosphatemia: Replaced.   DVT prophylaxis: SCDs. Now on IV heparin infusion per pharmacy. Code Status: Full Family Communication: Discussed in detail with patient's spouse at bedside. Disposition: Transferred to medical bed 6/29. To be determined postop.  Consultants:  [facility]  Gen. Surgery Cardiology  Procedures:  Colonoscopy 08/24/16: Impression:               - Preparation of the colon was fair.                           - One 7 mm polyp in the cecum, removed with a hot                            snare. Complete resection. Polyp tissue not  retrieved.                           - Likely malignant tumor in the cecum and at the                            appendiceal orifice. Biopsied. Tattooed.                           - Diverticulosis in the sigmoid colon, in the                            descending colon and at the splenic flexure.                           - Non-bleeding external hemorrhoids.                           - The  examination was otherwise normal.  Pathology results:  Diagnosis Ileocecal valve, biopsy, mass - SESSILE SERRATED POLYP WITH HIGH GRADE DYSPLASIA AND SMALL FOCUS SUSPICIOUS FOR ADENOCARCINOMA.  Antimicrobials:  None    Subjective: Seen this morning. No BM. Passing flatus. Denies any other complaints.  ROS: No chest pain, dyspnea or palpitations.  Objective:  Vitals:   08/26/16 1329 08/26/16 2038 08/27/16 0601 08/27/16 1321  BP: 122/88 117/63 118/69 (!) 105/59  Pulse: 69 64 61 61  Resp: 16 18 18 18   Temp: 97.6 F (36.4 C) 98.9 F (37.2 C) 98.6 F (37 C) 98.8 F (37.1 C)  TempSrc: Axillary Oral Oral Oral  SpO2: 100% 98% 98% 100%  Weight:      Height:        Examination:  General exam: Pleasant elderly male sitting up in bed eating breakfast this morning. Respiratory system: Clear to auscultation. No increased work of breathing. Cardiovascular system: S1 and S2 heard, RRR. No JVD, murmurs or pedal edema. Telemetry: Sinus rhythm with first-degree AV block. Occasional & transient sinus bradycardia in the 50s. Stable Gastrointestinal system: Abdomen is nondistended, soft and nontender. Normal bowel sounds heard. No change/stable Central nervous system: Alert and oriented. No focal neurological deficits. No change/stable Extremities: Symmetric 5 x 5 power. No change/stable Skin: No rashes, lesions or ulcers Psychiatry: Judgement and insight appear normal. Mood & affect appropriate. Stable    Data Reviewed: I have personally reviewed following labs and imaging studies  CBC:  Recent Labs Lab 08/23/16 0916 08/23/16 1738 08/24/16 0700 08/25/16 0324 08/26/16 0806 08/27/16 0518  WBC 11.0* 11.1* 9.7 12.2* 9.7 8.9  NEUTROABS 9.1*  --   --   --   --   --   HGB 11.1* 10.6* 10.2* 10.6* 10.6* 10.2*  HCT 33.4* 31.6* 30.2* 31.0* 31.5* 30.2*  MCV 89.3 89.0 89.1 88.1 89.2 88.3  PLT 134* 135* 133* 147* 152 572   Basic Metabolic Panel:  Recent Labs Lab 08/22/16 0302  08/23/16 0916 08/24/16 0700 08/25/16 0324 08/26/16 0806  NA 141 140 143 140 140  K 3.9 3.7 3.7 3.5 3.5  CL 112* 109 112* 111 109  CO2 22 22 24 23 24   GLUCOSE 124* 172* 118* 120* 108*  BUN 19 17 11 7 10   CREATININE 1.35* 1.48* 1.36* 1.27* 1.33*  CALCIUM 8.4* 8.4* 8.3* 8.2* 8.5*  MG  --  2.1  --   --   --   PHOS  --  1.9* 1.9*  1.9* 2.9  --    Liver Function Tests:  Recent Labs Lab 08/21/16 2303 08/23/16 0916 08/24/16 0700  AST 29 31  --   ALT 19 18  --   ALKPHOS 53 56  --   BILITOT 0.9 0.5  --   PROT 6.3* 6.3*  --   ALBUMIN 3.3* 3.5 3.2*   Coagulation Profile:  Recent Labs Lab 08/21/16 2303  INR 1.03   CBG:  Recent Labs Lab 08/24/16 1659 08/24/16 2319 08/25/16 0641 08/25/16 0736 08/25/16 1521  GLUCAP 122* 108* 122* 116* 123*    Recent Results (from the past 240 hour(s))  MRSA PCR Screening     Status: None   Collection Time: 08/21/16 10:20 PM  Result Value Ref Range Status   MRSA by PCR NEGATIVE NEGATIVE Final    Comment:        The GeneXpert MRSA Assay (FDA approved for NASAL specimens only), is one component of a comprehensive MRSA colonization surveillance program. It is not intended to diagnose MRSA infection nor to guide or monitor treatment for MRSA infections.          Radiology Studies: Ct Abdomen Pelvis Wo Contrast  Result Date: 08/27/2016 CLINICAL DATA:  Preop right colectomy.  GI bleed. EXAM: CT ABDOMEN AND PELVIS WITHOUT CONTRAST TECHNIQUE: Multidetector CT imaging of the abdomen and pelvis was performed following the standard protocol without IV contrast. COMPARISON:  08/21/2016 FINDINGS: Lower chest: No acute findings. Hepatobiliary: No focal hepatic abnormality. Gallbladder unremarkable. Pancreas: No focal abnormality or ductal dilatation. Spleen: No focal abnormality.  Normal size. Adrenals/Urinary Tract: Adrenal glands unremarkable. Bilateral renal parapelvic cysts and cortical thinning. No hydronephrosis. Urinary bladder  unremarkable. Stomach/Bowel: Appendix is normal. Scattered sigmoid diverticula. No active diverticulitis. Stomach and small bowel decompressed, unremarkable. Vascular/Lymphatic: Aortic and iliac calcifications. No aneurysm or adenopathy. Reproductive: Prostate enlargement. Other: No free fluid or free air. Musculoskeletal: No acute bony abnormality. IMPRESSION: Sigmoid diverticulosis.  No active diverticulitis. Aortoiliac atherosclerosis. Bilateral renal parapelvic cysts. No acute findings. Electronically Signed   By: Rolm Baptise M.D.   On: 08/27/2016 10:30   Dg Chest 2 View  Result Date: 08/27/2016 CLINICAL DATA:  Preop obstructive sleep apnea EXAM: CHEST  2 VIEW COMPARISON:  10/11/2010 FINDINGS: Heart and mediastinal contours are within normal limits. No focal opacities or effusions. No acute bony abnormality. IMPRESSION: No active cardiopulmonary disease. Electronically Signed   By: Rolm Baptise M.D.   On: 08/27/2016 11:01        Scheduled Meds: . allopurinol  100 mg Oral Daily  . feeding supplement (ENSURE ENLIVE)  237 mL Oral BID BM   Continuous Infusions: . heparin 1,200 Units/hr (08/27/16 1323)     LOS: 6 days    Okechukwu Regnier, MD, FACP, FHM. [facility] Pager [phone]  If 7PM-7AM, please contact night-coverage [email or web address] Password [email or web address] 08/27/2016, 2:21 PM

## 2016-08-27 NOTE — Progress Notes (Signed)
3 Days Post-Op   Subjective/Chief Complaint: No complaints. No further blood in stool   Objective: Vital signs in last 24 hours: Temp:  [97.6 F (36.4 C)-98.9 F (37.2 C)] 98.6 F (37 C) (07/01 0601) Pulse Rate:  [61-69] 61 (07/01 0601) Resp:  [16-18] 18 (07/01 0601) BP: (117-122)/(63-88) 118/69 (07/01 0601) SpO2:  [98 %-100 %] 98 % (07/01 0601) Last BM Date: 08/25/16  Intake/Output from previous day: 06/30 0701 - 07/01 0700 In: 910 [P.O.:630; I.V.:280] Out: 2025 [Urine:2025] Intake/Output this shift: No intake/output data recorded.  General appearance: alert and cooperative Resp: clear to auscultation bilaterally Cardio: regular rate and rhythm GI: soft, nontender  Lab Results:   Recent Labs  08/26/16 0806 08/27/16 0518  WBC 9.7 8.9  HGB 10.6* 10.2*  HCT 31.5* 30.2*  PLT 152 161   BMET  Recent Labs  08/25/16 0324 08/26/16 0806  NA 140 140  K 3.5 3.5  CL 111 109  CO2 23 24  GLUCOSE 120* 108*  BUN 7 10  CREATININE 1.27* 1.33*  CALCIUM 8.2* 8.5*   PT/INR No results for input(s): LABPROT, INR in the last 72 hours. ABG No results for input(s): PHART, HCO3 in the last 72 hours.  Invalid input(s): PCO2, PO2  Studies/Results: No results found.  Anti-infectives: Anti-infectives    None      Assessment/Plan: s/p Procedure(s): COLONOSCOPY WITH PROPOFOL (N/A) stay on liquids today in preparation for surgery tomorrow. Npo after midnight Plan for lap right colectomy tomorrow. Risks and benefits of the surgery as well as some of the technical aspects discussed with the patient and he understands and wishes to proceed. Stop heparin 2 hr before surgery  LOS: 6 days    Bradley Short,Bradley Short S 08/27/2016

## 2016-08-27 NOTE — Progress Notes (Signed)
Progress Note  Patient Name: Bradley Short Date of Encounter: 08/27/2016  Primary Cardiologist: Hochrein, allred   Subjective   83 yo with recent dx of colon cancer We were asked to see for pre op eval  Inpatient Medications    Scheduled Meds: . allopurinol  100 mg Oral Daily  . feeding supplement (ENSURE ENLIVE)  237 mL Oral BID BM   Continuous Infusions: . heparin 1,200 Units/hr (08/26/16 1402)   PRN Meds: acetaminophen **OR** acetaminophen, LORazepam, ondansetron **OR** ondansetron (ZOFRAN) IV   Vital Signs    Vitals:   08/26/16 0430 08/26/16 1329 08/26/16 2038 08/27/16 0601  BP: (!) 136/59 122/88 117/63 118/69  Pulse: 70 69 64 61  Resp: 16 16 18 18   Temp: 97.5 F (36.4 C) 97.6 F (36.4 C) 98.9 F (37.2 C) 98.6 F (37 C)  TempSrc: Oral Axillary Oral Oral  SpO2: 98% 100% 98% 98%  Weight: 204 lb 3.2 oz (92.6 kg)     Height: 5\' 11"  (1.803 m)       Intake/Output Summary (Last 24 hours) at 08/27/16 0928 Last data filed at 08/27/16 0600  Gross per 24 hour  Intake           429.97 ml  Output             1750 ml  Net         -1320.03 ml   Filed Weights   08/21/16 2200 08/25/16 1640 08/26/16 0430  Weight: 205 lb 0.4 oz (93 kg) 205 lb 11 oz (93.3 kg) 204 lb 3.2 oz (92.6 kg)    Telemetry    NSR  - Personally Reviewed  ECG     NSR  - Personally Reviewed  Physical Exam   GEN: No acute distress.   Neck: No JVD Cardiac: RRR, no murmurs, rubs, or gallops.  Respiratory: Clear to auscultation bilaterally. GI: Soft, nontender, non-distended  MS: No edema; No deformity. Neuro:  Nonfocal  Psych: Normal affect   Labs    Chemistry Recent Labs Lab 08/21/16 2303  08/23/16 0916 08/24/16 0700 08/25/16 0324 08/26/16 0806  NA 138  < > 140 143 140 140  K 4.1  < > 3.7 3.7 3.5 3.5  CL 108  < > 109 112* 111 109  CO2 23  < > 22 24 23 24   GLUCOSE 151*  < > 172* 118* 120* 108*  BUN 18  < > 17 11 7 10   CREATININE 1.34*  < > 1.48* 1.36* 1.27* 1.33*  CALCIUM  8.3*  < > 8.4* 8.3* 8.2* 8.5*  PROT 6.3*  --  6.3*  --   --   --   ALBUMIN 3.3*  --  3.5 3.2*  --   --   AST 29  --  31  --   --   --   ALT 19  --  18  --   --   --   ALKPHOS 53  --  56  --   --   --   BILITOT 0.9  --  0.5  --   --   --   GFRNONAA 48*  < > 42* 47* 51* 48*  GFRAA 55*  < > 49* 54* 59* 56*  ANIONGAP 7  < > 9 7 6 7   < > = values in this interval not displayed.   Hematology Recent Labs Lab 08/25/16 0324 08/26/16 0806 08/27/16 0518  WBC 12.2* 9.7 8.9  RBC 3.52* 3.53* 3.42*  HGB 10.6*  10.6* 10.2*  HCT 31.0* 31.5* 30.2*  MCV 88.1 89.2 88.3  MCH 30.1 30.0 29.8  MCHC 34.2 33.7 33.8  RDW 14.5 14.6 14.5  PLT 147* 152 161    Cardiac EnzymesNo results for input(s): TROPONINI in the last 168 hours. No results for input(s): TROPIPOC in the last 168 hours.   BNPNo results for input(s): BNP, PROBNP in the last 168 hours.   DDimer No results for input(s): DDIMER in the last 168 hours.   Radiology    No results found.  Cardiac Studies      Patient Profile     83 y.o. male with PAF   Assessment & Plan    1.  Colon cancer:   We have cleared him for surgery .  He is low - moderate risk for CV complications  Off Xarelto   Plans are for hemicolectomy tomorrow   Signed, Mertie Moores, MD  08/27/2016, 9:28 AM

## 2016-08-27 NOTE — Progress Notes (Addendum)
ANTICOAGULATION CONSULT NOTE - Follow-up  Pharmacy Consult for Heparin Indication: atrial fibrillation, bridge while off Xarelto  Allergies  Allergen Reactions  . Diltiazem Hcl     REACTION: Looked like burned all over  . Lisinopril     REACTION: Looked like burned all over  . Metoprolol Succinate     REACTION: Looked like burned all over  . Propafenone Hcl     REACTION: Looked like burned all over    Patient Measurements: Height: 5\' 11"  (180.3 cm) Weight: 204 lb 3.2 oz (92.6 kg) IBW/kg (Calculated) : 75.3 Heparin Dosing Weight: 93kg  Vital Signs: Temp: 98.6 F (37 C) (07/01 0601) Temp Source: Oral (07/01 0601) BP: 118/69 (07/01 0601) Pulse Rate: 61 (07/01 0601)  Labs:  Recent Labs  08/25/16 0324  08/25/16 2119 08/26/16 0806 08/26/16 1619 08/27/16 0518  HGB 10.6*  --   --  10.6*  --  10.2*  HCT 31.0*  --   --  31.5*  --  30.2*  PLT 147*  --   --  152  --  161  APTT  --   --  46*  --   --   --   HEPARINUNFRC  --   < > 0.16* 0.28* 0.44 0.38  CREATININE 1.27*  --   --  1.33*  --   --   < > = values in this interval not displayed.  Estimated Creatinine Clearance: 49.8 mL/min (A) (by C-G formula based on SCr of 1.33 mg/dL (H)).   Medical History: Past Medical History:  Diagnosis Date  . Atrial fibrillation (HCC)    paroxysmal, s/p PVI by Dr Rayann Heman 8/12  . Atrial flutter ([place])    s/p CTI ablation  . Hyperlipidemia   . Obstructive sleep apnea (adult) (pediatric)    on CPAP    Medications:  Infusions:  . heparin 1,200 Units/hr (08/26/16 1402)   Xarelto 20mg  daily PTA- LD: 08/20/16  Assessment: 84 yoM on chronic anticoagulation for Afib.  He was admitted with BRBPR on 6/25.  Xarelto was held and he had colonoscopy on 6/28.  No active bleeding found, but incidentally found cecal mass that was suspicious for malignancy (bx results pending).  Surgery was consulted for removal which is tentatively planned for Monday.  Pharmacy is consulted to bridge with heparin  in the interim per cardiology recommendations.      Today: - Heparin level within target range on 1200units/hr.  - CBC stable. No bleeding reported/documented. - SCr remains slightly elevated.  Goal of Therapy:  Heparin level 0.3-0.7 units/ml - but aiming for lower end of range due to risk of bleeding. Monitor platelets by anticoagulation protocol: Yes   Plan:  Cont heparin infusion at 1200units/hr. Check heparin level daily. Check CBC q24h while on heparin. Per Dr. Marlou Starks, stopping heparin at 0700 08/28/16.    Romeo Rabon, PharmD, pager [phone]. 08/27/2016,7:58 AM.

## 2016-08-28 ENCOUNTER — Inpatient Hospital Stay (HOSPITAL_COMMUNITY): Payer: Medicare Other | Admitting: Certified Registered Nurse Anesthetist

## 2016-08-28 ENCOUNTER — Encounter (HOSPITAL_COMMUNITY): Payer: Self-pay

## 2016-08-28 ENCOUNTER — Encounter (HOSPITAL_COMMUNITY)
Admission: AD | Disposition: A | Discharge status: Home Health | Payer: Self-pay | Source: Other Acute Inpatient Hospital | Attending: General Surgery

## 2016-08-28 DIAGNOSIS — Z9049 Acquired absence of other specified parts of digestive tract: Secondary | ICD-10-CM

## 2016-08-28 HISTORY — PX: LAPAROSCOPIC RIGHT COLECTOMY: SHX5925

## 2016-08-28 LAB — CBC
HCT: 33.6 % — ABNORMAL LOW (ref 39.0–52.0)
Hemoglobin: 11.2 g/dL — ABNORMAL LOW (ref 13.0–17.0)
MCH: 29.9 pg (ref 26.0–34.0)
MCHC: 33.3 g/dL (ref 30.0–36.0)
MCV: 89.8 fL (ref 78.0–100.0)
PLATELETS: 191 10*3/uL (ref 150–400)
RBC: 3.74 MIL/uL — AB (ref 4.22–5.81)
RDW: 14.6 % (ref 11.5–15.5)
WBC: 10.7 10*3/uL — AB (ref 4.0–10.5)

## 2016-08-28 LAB — HEPARIN LEVEL (UNFRACTIONATED): HEPARIN UNFRACTIONATED: 0.47 [IU]/mL (ref 0.30–0.70)

## 2016-08-28 LAB — SURGICAL PCR SCREEN
MRSA, PCR: NEGATIVE
STAPHYLOCOCCUS AUREUS: NEGATIVE

## 2016-08-28 SURGERY — COLECTOMY, RIGHT, LAPAROSCOPIC
Anesthesia: General | Laterality: Right

## 2016-08-28 MED ORDER — PHENYLEPHRINE 40 MCG/ML (10ML) SYRINGE FOR IV PUSH (FOR BLOOD PRESSURE SUPPORT)
PREFILLED_SYRINGE | INTRAVENOUS | Status: AC
  Filled 2016-08-28: qty 10

## 2016-08-28 MED ORDER — LIDOCAINE 2% (20 MG/ML) 5 ML SYRINGE
INTRAMUSCULAR | Status: AC
  Filled 2016-08-28: qty 5

## 2016-08-28 MED ORDER — ONDANSETRON HCL 4 MG/2ML IJ SOLN
INTRAMUSCULAR | Status: AC
  Filled 2016-08-28: qty 2

## 2016-08-28 MED ORDER — ROCURONIUM BROMIDE 50 MG/5ML IV SOSY
PREFILLED_SYRINGE | INTRAVENOUS | Status: DC | PRN
  Administered 2016-08-28: 50 mg via INTRAVENOUS
  Administered 2016-08-28 (×2): 10 mg via INTRAVENOUS
  Administered 2016-08-28: 20 mg via INTRAVENOUS

## 2016-08-28 MED ORDER — HEPARIN (PORCINE) IN NACL 100-0.45 UNIT/ML-% IJ SOLN
950.0000 [IU]/h | INTRAMUSCULAR | Status: DC
  Administered 2016-08-29: 950 [IU]/h via INTRAVENOUS
  Filled 2016-08-28 (×3): qty 250

## 2016-08-28 MED ORDER — ESMOLOL HCL 100 MG/10ML IV SOLN
INTRAVENOUS | Status: AC
  Filled 2016-08-28: qty 10

## 2016-08-28 MED ORDER — PHENYLEPHRINE HCL 10 MG/ML IJ SOLN
INTRAMUSCULAR | Status: AC
  Filled 2016-08-28: qty 1

## 2016-08-28 MED ORDER — PROPOFOL 10 MG/ML IV BOLUS
INTRAVENOUS | Status: DC | PRN
  Administered 2016-08-28: 140 mg via INTRAVENOUS

## 2016-08-28 MED ORDER — EPHEDRINE 5 MG/ML INJ
INTRAVENOUS | Status: AC
  Filled 2016-08-28: qty 10

## 2016-08-28 MED ORDER — DEXAMETHASONE SODIUM PHOSPHATE 10 MG/ML IJ SOLN
INTRAMUSCULAR | Status: DC | PRN
  Administered 2016-08-28: 10 mg via INTRAVENOUS

## 2016-08-28 MED ORDER — AMIODARONE HCL IN DEXTROSE 360-4.14 MG/200ML-% IV SOLN
30.0000 mg/h | INTRAVENOUS | Status: DC
  Administered 2016-08-28 – 2016-09-02 (×11): 30 mg/h via INTRAVENOUS
  Filled 2016-08-28 (×12): qty 200

## 2016-08-28 MED ORDER — DEXAMETHASONE SODIUM PHOSPHATE 10 MG/ML IJ SOLN
INTRAMUSCULAR | Status: AC
  Filled 2016-08-28: qty 1

## 2016-08-28 MED ORDER — LIDOCAINE 2% (20 MG/ML) 5 ML SYRINGE
INTRAMUSCULAR | Status: DC | PRN
  Administered 2016-08-28: 100 mg via INTRAVENOUS

## 2016-08-28 MED ORDER — CEFOTETAN DISODIUM 2 G IJ SOLR
2.0000 g | INTRAMUSCULAR | Status: AC
  Administered 2016-08-28: 2 g via INTRAVENOUS
  Filled 2016-08-28: qty 2

## 2016-08-28 MED ORDER — BUPIVACAINE-EPINEPHRINE (PF) 0.25% -1:200000 IJ SOLN
INTRAMUSCULAR | Status: AC
  Filled 2016-08-28: qty 30

## 2016-08-28 MED ORDER — PROPOFOL 10 MG/ML IV BOLUS
INTRAVENOUS | Status: AC
  Filled 2016-08-28: qty 20

## 2016-08-28 MED ORDER — MORPHINE SULFATE (PF) 4 MG/ML IV SOLN: 1.0000 mg | INTRAVENOUS | Status: DC | PRN

## 2016-08-28 MED ORDER — ONDANSETRON HCL 4 MG/2ML IJ SOLN
INTRAMUSCULAR | Status: DC | PRN
  Administered 2016-08-28: 4 mg via INTRAVENOUS

## 2016-08-28 MED ORDER — MEPERIDINE HCL 50 MG/ML IJ SOLN: 6.2500 mg | INTRAMUSCULAR | Status: DC | PRN

## 2016-08-28 MED ORDER — ROCURONIUM BROMIDE 50 MG/5ML IV SOSY
PREFILLED_SYRINGE | INTRAVENOUS | Status: AC
  Filled 2016-08-28: qty 5

## 2016-08-28 MED ORDER — ESMOLOL HCL 100 MG/10ML IV SOLN
INTRAVENOUS | Status: DC | PRN
  Administered 2016-08-28 (×9): 20 mg via INTRAVENOUS

## 2016-08-28 MED ORDER — SUCCINYLCHOLINE CHLORIDE 200 MG/10ML IV SOSY
PREFILLED_SYRINGE | INTRAVENOUS | Status: DC | PRN
  Administered 2016-08-28: 120 mg via INTRAVENOUS

## 2016-08-28 MED ORDER — METOCLOPRAMIDE HCL 5 MG/ML IJ SOLN
INTRAMUSCULAR | Status: AC
  Filled 2016-08-28: qty 2

## 2016-08-28 MED ORDER — AMIODARONE LOAD VIA INFUSION
150.0000 mg | Freq: Once | INTRAVENOUS | Status: AC
  Administered 2016-08-28: 150 mg via INTRAVENOUS
  Filled 2016-08-28: qty 83.34

## 2016-08-28 MED ORDER — FENTANYL CITRATE (PF) 100 MCG/2ML IJ SOLN
25.0000 ug | INTRAMUSCULAR | Status: DC | PRN
  Administered 2016-08-28 (×2): 25 ug via INTRAVENOUS

## 2016-08-28 MED ORDER — SODIUM CHLORIDE 0.9 % IV SOLN
INTRAVENOUS | Status: DC | PRN
  Administered 2016-08-28: 25 ug/min via INTRAVENOUS

## 2016-08-28 MED ORDER — ONDANSETRON HCL 4 MG/2ML IJ SOLN: 4.0000 mg | Freq: Once | INTRAMUSCULAR | Status: DC | PRN

## 2016-08-28 MED ORDER — FENTANYL CITRATE (PF) 100 MCG/2ML IJ SOLN
INTRAMUSCULAR | Status: AC
  Filled 2016-08-28: qty 2

## 2016-08-28 MED ORDER — BUPIVACAINE-EPINEPHRINE (PF) 0.25% -1:200000 IJ SOLN
INTRAMUSCULAR | Status: DC | PRN
  Administered 2016-08-28: 25 mL

## 2016-08-28 MED ORDER — SUGAMMADEX SODIUM 200 MG/2ML IV SOLN
INTRAVENOUS | Status: AC
  Filled 2016-08-28: qty 2

## 2016-08-28 MED ORDER — AMIODARONE HCL IN DEXTROSE 360-4.14 MG/200ML-% IV SOLN
60.0000 mg/h | INTRAVENOUS | Status: AC
  Administered 2016-08-28: 60 mg/h via INTRAVENOUS
  Filled 2016-08-28 (×2): qty 200

## 2016-08-28 MED ORDER — LACTATED RINGERS IV SOLN
INTRAVENOUS | Status: DC | PRN
  Administered 2016-08-28 (×2): via INTRAVENOUS

## 2016-08-28 MED ORDER — PHENYLEPHRINE HCL 10 MG/ML IJ SOLN
INTRAMUSCULAR | Status: DC | PRN
  Administered 2016-08-28 (×5): 80 ug via INTRAVENOUS

## 2016-08-28 MED ORDER — MORPHINE SULFATE (PF) 2 MG/ML IV SOLN
1.0000 mg | INTRAVENOUS | Status: DC | PRN
  Administered 2016-08-28: 2 mg via INTRAVENOUS
  Administered 2016-08-28: 4 mg via INTRAVENOUS
  Administered 2016-08-28: 2 mg via INTRAVENOUS
  Filled 2016-08-28: qty 2
  Filled 2016-08-28 (×2): qty 1

## 2016-08-28 MED ORDER — SUGAMMADEX SODIUM 200 MG/2ML IV SOLN
INTRAVENOUS | Status: DC | PRN
  Administered 2016-08-28: 200 mg via INTRAVENOUS

## 2016-08-28 MED ORDER — CEFOTETAN DISODIUM-DEXTROSE 2-2.08 GM-% IV SOLR
INTRAVENOUS | Status: AC
  Filled 2016-08-28: qty 50

## 2016-08-28 MED ORDER — EPHEDRINE SULFATE 50 MG/ML IJ SOLN
INTRAMUSCULAR | Status: DC | PRN
  Administered 2016-08-28 (×2): 5 mg via INTRAVENOUS

## 2016-08-28 MED ORDER — FENTANYL CITRATE (PF) 100 MCG/2ML IJ SOLN
INTRAMUSCULAR | Status: DC | PRN
  Administered 2016-08-28 (×2): 50 ug via INTRAVENOUS
  Administered 2016-08-28: 100 ug via INTRAVENOUS
  Administered 2016-08-28: 50 ug via INTRAVENOUS

## 2016-08-28 MED ORDER — METOCLOPRAMIDE HCL 5 MG/ML IJ SOLN
5.0000 mg | Freq: Once | INTRAMUSCULAR | Status: AC
  Administered 2016-08-28: 5 mg via INTRAVENOUS

## 2016-08-28 MED ORDER — LACTATED RINGERS IR SOLN
Status: DC | PRN
  Administered 2016-08-28: 1000 mL

## 2016-08-28 MED ORDER — FENTANYL CITRATE (PF) 250 MCG/5ML IJ SOLN
INTRAMUSCULAR | Status: AC
  Filled 2016-08-28: qty 5

## 2016-08-28 MED ORDER — SUCCINYLCHOLINE CHLORIDE 200 MG/10ML IV SOSY
PREFILLED_SYRINGE | INTRAVENOUS | Status: AC
  Filled 2016-08-28: qty 10

## 2016-08-28 MED ORDER — 0.9 % SODIUM CHLORIDE (POUR BTL) OPTIME
TOPICAL | Status: DC | PRN
  Administered 2016-08-28: 4000 mL

## 2016-08-28 SURGICAL SUPPLY — 67 items
APPLIER CLIP 5 13 M/L LIGAMAX5 (MISCELLANEOUS) ×3
APPLIER CLIP ROT 10 11.4 M/L (STAPLE) ×3
BAG URINE DRAINAGE (UROLOGICAL SUPPLIES) IMPLANT
BLADE EXTENDED COATED 6.5IN (ELECTRODE) ×3 IMPLANT
BLADE HEX COATED 2.75 (ELECTRODE) ×3 IMPLANT
CABLE HIGH FREQUENCY MONO STRZ (ELECTRODE) ×3 IMPLANT
CATH FOLEY 2WAY SLVR  5CC 12FR (CATHETERS) ×2
CATH FOLEY 2WAY SLVR  5CC 14FR (CATHETERS) ×2
CATH FOLEY 2WAY SLVR 5CC 12FR (CATHETERS) ×1 IMPLANT
CATH FOLEY 2WAY SLVR 5CC 14FR (CATHETERS) ×1 IMPLANT
CELLS DAT CNTRL 66122 CELL SVR (MISCELLANEOUS) ×1 IMPLANT
CLIP APPLIE 5 13 M/L LIGAMAX5 (MISCELLANEOUS) ×1 IMPLANT
CLIP APPLIE ROT 10 11.4 M/L (STAPLE) ×1 IMPLANT
DECANTER SPIKE VIAL GLASS SM (MISCELLANEOUS) ×3 IMPLANT
DRAIN CHANNEL 19F RND (DRAIN) ×3 IMPLANT
DRAPE LAPAROSCOPIC ABDOMINAL (DRAPES) ×3 IMPLANT
DRSG OPSITE POSTOP 4X8 (GAUZE/BANDAGES/DRESSINGS) ×3 IMPLANT
ELECT REM PT RETURN 15FT ADLT (MISCELLANEOUS) ×3 IMPLANT
FILTER SMOKE EVAC LAPAROSHD (FILTER) IMPLANT
GAUZE SPONGE 2X2 8PLY STRL LF (GAUZE/BANDAGES/DRESSINGS) ×1 IMPLANT
GAUZE SPONGE 4X4 12PLY STRL (GAUZE/BANDAGES/DRESSINGS) ×3 IMPLANT
GLOVE ECLIPSE 8.0 STRL XLNG CF (GLOVE) ×6 IMPLANT
GLOVE INDICATOR 8.0 STRL GRN (GLOVE) ×6 IMPLANT
GOWN STRL REUS W/TWL LRG LVL3 (GOWN DISPOSABLE) ×3 IMPLANT
GOWN STRL REUS W/TWL XL LVL3 (GOWN DISPOSABLE) ×6 IMPLANT
LEGGING LITHOTOMY PAIR STRL (DRAPES) IMPLANT
LIGASURE IMPACT 36 18CM CVD LR (INSTRUMENTS) ×3 IMPLANT
PACK COLON (CUSTOM PROCEDURE TRAY) ×3 IMPLANT
PAD POSITIONING PINK XL (MISCELLANEOUS) IMPLANT
PAD TELFA 2X3 NADH STRL (GAUZE/BANDAGES/DRESSINGS) ×9 IMPLANT
PAIN PUMP ON-Q 270MLX5ML 5IN (MISCELLANEOUS) IMPLANT
POSITIONER SURGICAL ARM (MISCELLANEOUS) IMPLANT
PUMP PAIN ON-Q (MISCELLANEOUS) ×3 IMPLANT
RELOAD PROXIMATE 75MM BLUE (ENDOMECHANICALS) ×9 IMPLANT
RTRCTR WOUND ALEXIS 18CM MED (MISCELLANEOUS) ×3
SCISSORS LAP 5X35 DISP (ENDOMECHANICALS) ×3 IMPLANT
SEALER TISSUE X1 CVD JAW (INSTRUMENTS) IMPLANT
SET IRRIG TUBING LAPAROSCOPIC (IRRIGATION / IRRIGATOR) ×3 IMPLANT
SHEARS HARMONIC ACE PLUS 36CM (ENDOMECHANICALS) IMPLANT
SOLUTION ANTI FOG 6CC (MISCELLANEOUS) ×3 IMPLANT
SPONGE GAUZE 2X2 STER 10/PKG (GAUZE/BANDAGES/DRESSINGS) ×2
STAPLER GUN LINEAR PROX 60 (STAPLE) ×3 IMPLANT
STAPLER PROXIMATE 75MM BLUE (STAPLE) ×3 IMPLANT
STAPLER VISISTAT 35W (STAPLE) ×6 IMPLANT
SUT PDS AB 1 CTX 36 (SUTURE) ×6 IMPLANT
SUT PDS AB 1 TP1 96 (SUTURE) ×6 IMPLANT
SUT PROLENE 2 0 KS (SUTURE) IMPLANT
SUT SILK 2 0 (SUTURE) ×2
SUT SILK 2 0 SH CR/8 (SUTURE) ×6 IMPLANT
SUT SILK 2-0 18XBRD TIE 12 (SUTURE) ×1 IMPLANT
SUT SILK 3 0 (SUTURE) ×2
SUT SILK 3 0 SH CR/8 (SUTURE) ×3 IMPLANT
SUT SILK 3-0 18XBRD TIE 12 (SUTURE) ×1 IMPLANT
SUT VIC AB 2-0 SH 18 (SUTURE) ×6 IMPLANT
SUT VICRYL 2 0 18  UND BR (SUTURE) ×4
SUT VICRYL 2 0 18 UND BR (SUTURE) ×2 IMPLANT
SYR 30ML LL (SYRINGE) IMPLANT
SYRINGE IRR TOOMEY STRL 70CC (SYRINGE) IMPLANT
SYS LAPSCP GELPORT 120MM (MISCELLANEOUS)
SYSTEM LAPSCP GELPORT 120MM (MISCELLANEOUS) IMPLANT
TAPE CLOTH 2X10 TAN LF (GAUZE/BANDAGES/DRESSINGS) ×3 IMPLANT
TAPE CLOTH 4X10 WHT NS (GAUZE/BANDAGES/DRESSINGS) IMPLANT
TRAY FOLEY W/METER SILVER 16FR (SET/KITS/TRAYS/PACK) ×3 IMPLANT
TROCAR CANNULA 5X100MM C0Q10 (TROCAR) ×3 IMPLANT
TROCAR KII 11MM THR (TROCAR) IMPLANT
TUBING INSUF HEATED (TUBING) ×3 IMPLANT
YANKAUER SUCT BULB TIP NO VENT (SUCTIONS) ×3 IMPLANT

## 2016-08-28 NOTE — H&P (View-Only) (Signed)
3 Days Post-Op   Subjective/Chief Complaint: No complaints. No further blood in stool   Objective: Vital signs in last 24 hours: Temp:  [97.6 F (36.4 C)-98.9 F (37.2 C)] 98.6 F (37 C) (07/01 0601) Pulse Rate:  [61-69] 61 (07/01 0601) Resp:  [16-18] 18 (07/01 0601) BP: (117-122)/(63-88) 118/69 (07/01 0601) SpO2:  [98 %-100 %] 98 % (07/01 0601) Last BM Date: 08/25/16  Intake/Output from previous day: 06/30 0701 - 07/01 0700 In: 910 [P.O.:630; I.V.:280] Out: 2025 [Urine:2025] Intake/Output this shift: No intake/output data recorded.  General appearance: alert and cooperative Resp: clear to auscultation bilaterally Cardio: regular rate and rhythm GI: soft, nontender  Lab Results:   Recent Labs  08/26/16 0806 08/27/16 0518  WBC 9.7 8.9  HGB 10.6* 10.2*  HCT 31.5* 30.2*  PLT 152 161   BMET  Recent Labs  08/25/16 0324 08/26/16 0806  NA 140 140  K 3.5 3.5  CL 111 109  CO2 23 24  GLUCOSE 120* 108*  BUN 7 10  CREATININE 1.27* 1.33*  CALCIUM 8.2* 8.5*   PT/INR No results for input(s): LABPROT, INR in the last 72 hours. ABG No results for input(s): PHART, HCO3 in the last 72 hours.  Invalid input(s): PCO2, PO2  Studies/Results: No results found.  Anti-infectives: Anti-infectives    None      Assessment/Plan: s/p Procedure(s): COLONOSCOPY WITH PROPOFOL (N/A) stay on liquids today in preparation for surgery tomorrow. Npo after midnight Plan for lap right colectomy tomorrow. Risks and benefits of the surgery as well as some of the technical aspects discussed with the patient and he understands and wishes to proceed. Stop heparin 2 hr before surgery  LOS: 6 days    TOTH III,Maritza Goldsborough S 08/27/2016

## 2016-08-28 NOTE — Progress Notes (Signed)
Progress Note  Patient Name: Bradley Short Date of Encounter: 08/28/2016  Primary Cardiologist: Hochrein, allred   Subjective   No complaints just fatigue Went into rapid afib over night   Inpatient Medications    Scheduled Meds: . allopurinol  100 mg Oral Daily  . feeding supplement (ENSURE ENLIVE)  237 mL Oral BID BM   Continuous Infusions:  PRN Meds: acetaminophen **OR** acetaminophen, LORazepam, ondansetron **OR** ondansetron (ZOFRAN) IV   Vital Signs    Vitals:   08/27/16 1321 08/27/16 2105 08/27/16 2302 08/28/16 0556  BP: (!) 105/59 103/61 109/61 128/63  Pulse: 61 72 71 75  Resp: 18 18  18   Temp: 98.8 F (37.1 C) 98.9 F (37.2 C)  98 F (36.7 C)  TempSrc: Oral Oral  Oral  SpO2: 100% 99%  98%  Weight:      Height:        Intake/Output Summary (Last 24 hours) at 08/28/16 0838 Last data filed at 08/28/16 0700  Gross per 24 hour  Intake              240 ml  Output             1800 ml  Net            -1560 ml   Filed Weights   08/21/16 2200 08/25/16 1640 08/26/16 0430  Weight: 93 kg (205 lb 0.4 oz) 93.3 kg (205 lb 11 oz) 92.6 kg (204 lb 3.2 oz)    Telemetry    Afib rate 90-100  - Personally Reviewed  ECG     NSR  - Personally Reviewed  Physical Exam   Affect appropriate Healthy:  appears stated age HEENT: normal Neck supple with no adenopathy JVP normal no bruits no thyromegaly Lungs clear with no wheezing and good diaphragmatic motion Heart:  S1/S2 no murmur, no rub, gallop or click PMI normal Abdomen: benighn, BS positve, no tenderness, no AAA no bruit.  No HSM or HJR Distal pulses intact with no bruits No edema Neuro non-focal Skin warm and dry No muscular weakness   Labs    Chemistry Recent Labs Lab 08/21/16 2303  08/23/16 0916 08/24/16 0700 08/25/16 0324 08/26/16 0806  NA 138  < > 140 143 140 140  K 4.1  < > 3.7 3.7 3.5 3.5  CL 108  < > 109 112* 111 109  CO2 23  < > 22 24 23 24   GLUCOSE 151*  < > 172* 118* 120* 108*    BUN 18  < > 17 11 7 10   CREATININE 1.34*  < > 1.48* 1.36* 1.27* 1.33*  CALCIUM 8.3*  < > 8.4* 8.3* 8.2* 8.5*  PROT 6.3*  --  6.3*  --   --   --   ALBUMIN 3.3*  --  3.5 3.2*  --   --   AST 29  --  31  --   --   --   ALT 19  --  18  --   --   --   ALKPHOS 53  --  56  --   --   --   BILITOT 0.9  --  0.5  --   --   --   GFRNONAA 48*  < > 42* 47* 51* 48*  GFRAA 55*  < > 49* 54* 59* 56*  ANIONGAP 7  < > 9 7 6 7   < > = values in this interval not displayed.   Hematology  Recent Labs  Lab 08/27/16 0518 08/27/16 2203 08/28/16 0527  WBC 8.9 9.5 10.7*  RBC 3.42* 3.71* 3.74*  HGB 10.2* 11.0* 11.2*  HCT 30.2* 32.5* 33.6*  MCV 88.3 87.6 89.8  MCH 29.8 29.6 29.9  MCHC 33.8 33.8 33.3  RDW 14.5 14.4 14.6  PLT 161 188 191    Cardiac EnzymesNo results for input(s): TROPONINI in the last 168 hours. No results for input(s): TROPIPOC in the last 168 hours.   BNPNo results for input(s): BNP, PROBNP in the last 168 hours.   DDimer No results for input(s): DDIMER in the last 168 hours.   Radiology    Ct Abdomen Pelvis Wo Contrast  Result Date: 08/27/2016 CLINICAL DATA:  Preop right colectomy.  GI bleed. EXAM: CT ABDOMEN AND PELVIS WITHOUT CONTRAST TECHNIQUE: Multidetector CT imaging of the abdomen and pelvis was performed following the standard protocol without IV contrast. COMPARISON:  08/21/2016 FINDINGS: Lower chest: No acute findings. Hepatobiliary: No focal hepatic abnormality. Gallbladder unremarkable. Pancreas: No focal abnormality or ductal dilatation. Spleen: No focal abnormality.  Normal size. Adrenals/Urinary Tract: Adrenal glands unremarkable. Bilateral renal parapelvic cysts and cortical thinning. No hydronephrosis. Urinary bladder unremarkable. Stomach/Bowel: Appendix is normal. Scattered sigmoid diverticula. No active diverticulitis. Stomach and small bowel decompressed, unremarkable. Vascular/Lymphatic: Aortic and iliac calcifications. No aneurysm or adenopathy. Reproductive:  Prostate enlargement. Other: No free fluid or free air. Musculoskeletal: No acute bony abnormality. IMPRESSION: Sigmoid diverticulosis.  No active diverticulitis. Aortoiliac atherosclerosis. Bilateral renal parapelvic cysts. No acute findings. Electronically Signed   By: Rolm Baptise M.D.   On: 08/27/2016 10:30   Dg Chest 2 View  Result Date: 08/27/2016 CLINICAL DATA:  Preop obstructive sleep apnea EXAM: CHEST  2 VIEW COMPARISON:  10/11/2010 FINDINGS: Heart and mediastinal contours are within normal limits. No focal opacities or effusions. No acute bony abnormality. IMPRESSION: No active cardiopulmonary disease. Electronically Signed   By: Rolm Baptise M.D.   On: 08/27/2016 11:01    Cardiac Studies      Patient Profile     83 y.o. male with PAF CHAD2VASC 3 on xarelto at home Hisotry of afib/flutter ablation 2012 Normal EF 60-65% no history of CAD Unclear reactions to a combination of meds at [facility] Years ago and listed as intolerence to cardizem beta blocker and propafenone. "Looked like he was burnt"  For OR colectomy 08/28/16   Assessment & Plan    1. PAF:  Resume heparin post op when ok with surgery add iv amiodarone given PAF In OR can use esmolol for rate control if needed Post op would try inderal 10 mg PO TID if needed for rate control as previous allergy situation is not clear and would use shorter acting agent Resume home xarelto likely 72 hours post per surgery    Signed, Jenkins Rouge, MD  08/28/2016, 8:38 AM

## 2016-08-28 NOTE — Care Management Important Message (Signed)
Important Message  Patient Details  Name: Rolfe Hartsell MRN: [id number] Date of Birth: Mar 08, 1932   Medicare Important Message Given:  Yes    Kerin Salen 08/28/2016, 10:21 [place] Message  Patient Details  Name: Holt Woolbright MRN: [id number] Date of Birth: 05/24/1932   Medicare Important Message Given:  Yes    Kerin Salen 08/28/2016, 10:21 AM

## 2016-08-28 NOTE — Transfer of Care (Signed)
Immediate Anesthesia Transfer of Care Note  Patient: Takeo Demmon  Procedure(s) Performed: Procedure(s): LAPAROSCOPIC ASSISTED RIGHT COLECTOMY (Right)  Patient Location: PACU  Anesthesia Type:General  Level of Consciousness: awake, alert  and oriented  Airway & Oxygen Therapy: Patient Spontanous Breathing and Patient connected to face mask  Post-op Assessment: Report given to RN and Post -op Vital signs reviewed and stable  Post vital signs: Reviewed and stable  Last Vitals:  Vitals:   08/27/16 2302 08/28/16 0556  BP: 109/61 128/63  Pulse: 71 75  Resp:  18  Temp:  36.7 C    Last Pain:  Vitals:   08/28/16 1122  TempSrc:   PainSc: 0-No pain      Patients Stated Pain Goal: 3 (68/93/40 6840)  Complications: No apparent anesthesia complications

## 2016-08-28 NOTE — Anesthesia Preprocedure Evaluation (Signed)
Anesthesia Evaluation  Patient identified by MRN, date of birth, ID band Patient awake    Reviewed: Allergy & Precautions, NPO status , Patient's Chart, lab work & pertinent test results  Airway Mallampati: II  TM Distance: >3 FB     Dental   Pulmonary sleep apnea ,    breath sounds clear to auscultation       Cardiovascular hypertension, + dysrhythmias Atrial Fibrillation  Rhythm:Irregular Rate:Normal     Neuro/Psych    GI/Hepatic Neg liver ROS, GI history noted. CG   Endo/Other    Renal/GU Renal disease     Musculoskeletal   Abdominal   Peds  Hematology  (+) anemia ,   Anesthesia Other Findings   Reproductive/Obstetrics                             Anesthesia Physical Anesthesia Plan  ASA: III  Anesthesia Plan: General   Post-op Pain Management:    Induction: Intravenous  PONV Risk Score and Plan: 2 and Ondansetron, Dexamethasone, Propofol and Treatment may vary due to age or medical condition  Airway Management Planned: Oral ETT  Additional Equipment:   Intra-op Plan:   Post-operative Plan: Possible Post-op intubation/ventilation  Informed Consent: I have reviewed the patients History and Physical, chart, labs and discussed the procedure including the risks, benefits and alternatives for the proposed anesthesia with the patient or authorized representative who has indicated his/her understanding and acceptance.   Dental advisory given  Plan Discussed with: CRNA, Anesthesiologist and Surgeon  Anesthesia Plan Comments:         Anesthesia Quick Evaluation

## 2016-08-28 NOTE — Anesthesia Procedure Notes (Signed)
Procedure Name: Intubation Date/Time: 08/28/2016 12:24 PM Performed by: Maxwell Caul Pre-anesthesia Checklist: Patient identified, Emergency Drugs available, Suction available and Patient being monitored Patient Re-evaluated:Patient Re-evaluated prior to inductionOxygen Delivery Method: Circle system utilized Preoxygenation: Pre-oxygenation with 100% oxygen Intubation Type: IV induction, Rapid sequence and Cricoid Pressure applied Laryngoscope Size: Mac and 4 Grade View: Grade I Tube type: Oral Tube size: 7.5 mm Number of attempts: 1 Airway Equipment and Method: Stylet Placement Confirmation: ETT inserted through vocal cords under direct vision,  positive ETCO2 and breath sounds checked- equal and bilateral Tube secured with: Tape Dental Injury: Teeth and Oropharynx as per pre-operative assessment

## 2016-08-28 NOTE — Progress Notes (Signed)
PROGRESS NOTE   Bradley Short  ATF:[id number]    DOB: 19-Apr-1932    DOA: 08/21/2016  PCP: Manon Hilding, MD   I have briefly reviewed patients previous medical records in [place].  Brief Narrative:  83 year old male with PMH of PAF on Xarelto, HLD, OSA on CPAP, gout, presented with multiple episodes of painless BRBPR, weakness, diaphoresis, went to [facility] ED where hemoglobin around 12 g, initially A. fib with RVR which improved with IV fluids, CT abdomen and pelvis showed sigmoid diverticulosis without acute findings, transferred to [facility] stepdown unit. Hemodynamically and hemoglobin stable. [facility] consulted and patient underwent colonoscopy 6/28. Cecal lesion noted-Gen. surgery consulted and plan possible surgery 7/2. Cardiology provided preop clearance and made recommendations regarding A. fib management that patient developed overnight prior to surgery.  Assessment & Plan:   Principal Problem:   Acute GI bleeding Active Problems:   Pure hypercholesterolemia   ATRIAL FIBRILLATION   Acute blood loss anemia   Gout   Leukocytosis   Thrombocytopenia (HCC)   CKD (chronic kidney disease), stage III   Syncope, vasovagal   1. Acute GI bleed, suspect LGIB:  Xarelto held (last dose 08/20/16). [facility] was consulted and after holding anticoagulation for a couple days, performed colonoscopy-detailed results as below. As per GI follow-up, bleeding source likely diverticular in origin and has clinically resolved. Cecal mass noted-management as below. Outpatient follow-up with [facility]. 2. Cecal mass: Not amenable to remove endoscopically. GI and general surgery input appreciated and this will need to be removed surgically. 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 General surgery plans laparoscopic assisted right colectomy on 7/2. Preop CT abdomen without contrast 7/1: Sigmoid diverticulosis without acute  diverticulitis. No acute findings. Patient went into temporary A. fib with RVR overnight prior to surgery. Cardiology followed up and made recommendations as below. 3. Paroxysmal atrial fibrillation with RVR: Not on rate control medications PTA. Xarelto on hold. Patient was placed on IV heparin drip for a couple days prior to surgery and did not have any bleeding issues. Heparin held for surgery this morning. Cardiology indicated low to moderate risk for surgery. Developed A. fib with RVR in the 170s overnight 7/1. Discussed with Dr. Johnsie Cancel who saw the patient and recommended adding IV amiodarone given PAF, in OR can use esmolol for rate control if needed and postoperatively would try Inderal 10 MG 3 times a day if needed for rate control. Patient reports unclear allergy situation to beta blockers but he and his wife indicate that he was on 4-5 medications at that time and couldn't tell which one he was allergic to and is willing to try one medication at a time to see if he reacts again. Resume heparin postop and transition to Xarelto when okay with general surgery 4. Acute blood loss anemia: Secondary to GI bleed. Stable. 5. Syncope: Suspect vasovagal due to acute GI bleed. Telemetry without significant arrhythmias as cause. 2-D echo: LVEF 60-65 percent. Moderate diastolic dysfunction. No significant valvular abnormalities. Mobilize. PT recommends no follow-up at this time. 6. Hyperlipidemia: Resumed medications post colonoscopy. 7. Gout: No acute flare. Continue allopurinol. 8. Stage III chronic kidney disease: Baseline creatinine not known. Creatinine stable in the 1.2-1.3 range. 9. Thrombocytopenia: Secondary to acute blood loss. Resolved. 10. Leukocytosis: Likely stress response. Resolved. 11. Hypophosphatemia: Replaced.   DVT prophylaxis: SCDs. Now on IV heparin infusion per pharmacy-held prior to surgery. Code Status: Full Family Communication: Discussed in detail  with patient's spouse at  bedside. Disposition: Transferred to medical bed 6/29. To be determined postop.  Consultants:  [facility]  Gen. Surgery Cardiology  Procedures:  Colonoscopy 08/24/16: Impression:               - Preparation of the colon was fair.                           - One 7 mm polyp in the cecum, removed with a hot                            snare. Complete resection. Polyp tissue not                            retrieved.                           - Likely malignant tumor in the cecum and at the                            appendiceal orifice. Biopsied. Tattooed.                           - Diverticulosis in the sigmoid colon, in the                            descending colon and at the splenic flexure.                           - Non-bleeding external hemorrhoids.                           - The examination was otherwise normal.  Pathology results:  Diagnosis Ileocecal valve, biopsy, mass - SESSILE SERRATED POLYP WITH HIGH GRADE DYSPLASIA AND SMALL FOCUS SUSPICIOUS FOR ADENOCARCINOMA.  Antimicrobials:  None    Subjective: Seen this morning prior to surgery. Was a little anxious due to impending surgery but was better after taking Ativan. Overnight went into rapid A. fib briefly but was asymptomatic of palpitations but according to spouse, just felt bad. No dyspnea or chest pain. No BM since colonoscopy.  ROS: No chest pain, dyspnea or palpitations.  Objective:  Vitals:   08/27/16 1321 08/27/16 2105 08/27/16 2302 08/28/16 0556  BP: (!) 105/59 103/61 109/61 128/63  Pulse: 61 72 71 75  Resp: 18 18  18   Temp: 98.8 F (37.1 C) 98.9 F (37.2 C)  98 F (36.7 C)  TempSrc: Oral Oral  Oral  SpO2: 100% 99%  98%  Weight:      Height:        Examination:  General exam: Pleasant elderly male sitting up in bed. Respiratory system: Clear to auscultation. No increased work of breathing.Stable without change. Cardiovascular system: S1 and S2 heard, RRR. No JVD, murmurs or pedal edema.  Telemetry: Patient was in sinus rhythm for several days then went into A. fib with RVR up to 170s on 7/1 at approximately 7:30 PM. When seen this morning, A. fib with controlled ventricular rate. Gastrointestinal system: Abdomen is nondistended, soft and nontender. Normal bowel sounds heard. No change/stable Central nervous system: Alert  and oriented. No focal neurological deficits. Stable without change. Extremities: Symmetric 5 x 5 power. Stable.  Skin: No rashes, lesions or ulcers Psychiatry: Judgement and insight appear normal. Mood & affect appropriate. Stable    Data Reviewed: I have personally reviewed following labs and imaging studies  CBC:  Recent Labs Lab 08/23/16 0916  08/25/16 0324 08/26/16 0806 08/27/16 0518 08/27/16 2203 08/28/16 0527  WBC 11.0*  < > 12.2* 9.7 8.9 9.5 10.7*  NEUTROABS 9.1*  --   --   --   --   --   --   HGB 11.1*  < > 10.6* 10.6* 10.2* 11.0* 11.2*  HCT 33.4*  < > 31.0* 31.5* 30.2* 32.5* 33.6*  MCV 89.3  < > 88.1 89.2 88.3 87.6 89.8  PLT 134*  < > 147* 152 161 188 191  < > = values in this interval not displayed. Basic Metabolic Panel:  Recent Labs Lab 08/22/16 0302 08/23/16 0916 08/24/16 0700 08/25/16 0324 08/26/16 0806  NA 141 140 143 140 140  K 3.9 3.7 3.7 3.5 3.5  CL 112* 109 112* 111 109  CO2 22 22 24 23 24   GLUCOSE 124* 172* 118* 120* 108*  BUN 19 17 11 7 10   CREATININE 1.35* 1.48* 1.36* 1.27* 1.33*  CALCIUM 8.4* 8.4* 8.3* 8.2* 8.5*  MG  --  2.1  --   --   --   PHOS  --  1.9* 1.9*  1.9* 2.9  --    Liver Function Tests:  Recent Labs Lab 08/21/16 2303 08/23/16 0916 08/24/16 0700  AST 29 31  --   ALT 19 18  --   ALKPHOS 53 56  --   BILITOT 0.9 0.5  --   PROT 6.3* 6.3*  --   ALBUMIN 3.3* 3.5 3.2*   Coagulation Profile:  Recent Labs Lab 08/21/16 2303  INR 1.03   CBG:  Recent Labs Lab 08/24/16 1659 08/24/16 2319 08/25/16 0641 08/25/16 0736 08/25/16 1521  GLUCAP 122* 108* 122* 116* 123*    Recent Results  (from the past 240 hour(s))  MRSA PCR Screening     Status: None   Collection Time: 08/21/16 10:20 PM  Result Value Ref Range Status   MRSA by PCR NEGATIVE NEGATIVE Final    Comment:        The GeneXpert MRSA Assay (FDA approved for NASAL specimens only), is one component of a comprehensive MRSA colonization surveillance program. It is not intended to diagnose MRSA infection nor to guide or monitor treatment for MRSA infections.   Surgical pcr screen     Status: None   Collection Time: 08/28/16  6:46 AM  Result Value Ref Range Status   MRSA, PCR NEGATIVE NEGATIVE Final   Staphylococcus aureus NEGATIVE NEGATIVE Final    Comment:        The Xpert SA Assay (FDA approved for NASAL specimens in patients over 47 years of age), is one component of a comprehensive surveillance program.  Test performance has been validated by [facility] for patients greater than or equal to 31 year old. It is not intended to diagnose infection nor to guide or monitor treatment.          Radiology Studies: Ct Abdomen Pelvis Wo Contrast  Result Date: 08/27/2016 CLINICAL DATA:  Preop right colectomy.  GI bleed. EXAM: CT ABDOMEN AND PELVIS WITHOUT CONTRAST TECHNIQUE: Multidetector CT imaging of the abdomen and pelvis was performed following the standard protocol without IV contrast. COMPARISON:  08/21/2016 FINDINGS:  Lower chest: No acute findings. Hepatobiliary: No focal hepatic abnormality. Gallbladder unremarkable. Pancreas: No focal abnormality or ductal dilatation. Spleen: No focal abnormality.  Normal size. Adrenals/Urinary Tract: Adrenal glands unremarkable. Bilateral renal parapelvic cysts and cortical thinning. No hydronephrosis. Urinary bladder unremarkable. Stomach/Bowel: Appendix is normal. Scattered sigmoid diverticula. No active diverticulitis. Stomach and small bowel decompressed, unremarkable. Vascular/Lymphatic: Aortic and iliac calcifications. No aneurysm or adenopathy. Reproductive:  Prostate enlargement. Other: No free fluid or free air. Musculoskeletal: No acute bony abnormality. IMPRESSION: Sigmoid diverticulosis.  No active diverticulitis. Aortoiliac atherosclerosis. Bilateral renal parapelvic cysts. No acute findings. Electronically Signed   By: Rolm Baptise M.D.   On: 08/27/2016 10:30   Dg Chest 2 View  Result Date: 08/27/2016 CLINICAL DATA:  Preop obstructive sleep apnea EXAM: CHEST  2 VIEW COMPARISON:  10/11/2010 FINDINGS: Heart and mediastinal contours are within normal limits. No focal opacities or effusions. No acute bony abnormality. IMPRESSION: No active cardiopulmonary disease. Electronically Signed   By: Rolm Baptise M.D.   On: 08/27/2016 11:01        Scheduled Meds: . [MAR Hold] allopurinol  100 mg Oral Daily  . cefoTEtan in Dextrose 5%      . [MAR Hold] feeding supplement (ENSURE ENLIVE)  237 mL Oral BID BM   Continuous Infusions: . amiodarone 60 mg/hr (08/28/16 1005)   Followed by  . amiodarone    . [MAR Hold] cefoTEtan (CEFOTAN) 2 GM IVPB       LOS: 7 days    Venicia Vandall, MD, FACP, FHM. [facility] Pager [phone]  If 7PM-7AM, please contact night-coverage [email or web address] Password [email or web address] 08/28/2016, 12:14 PM

## 2016-08-28 NOTE — Op Note (Signed)
08/21/2016 - 08/28/2016  2:48 PM  PATIENT:  Bradley Short  83 y.o. male  PRE-OPERATIVE DIAGNOSIS:  Right Colon Mass with dysplasia  POST-OPERATIVE DIAGNOSIS:  Right Colon Mass with dysplasia  PROCEDURE:  Procedure(s): LAPAROSCOPIC ASSISTED RIGHT COLECTOMY (Right)  SURGEON:  Surgeon(s) and Role:    * Jovita Kussmaul, MD - Primary  PHYSICIAN ASSISTANT:   ASSISTANTS: none   ANESTHESIA:   local and general  EBL:  Total I/O In: 1000 [I.V.:1000] Out: 225 [Urine:200; Blood:25]  BLOOD ADMINISTERED:none  DRAINS: none   LOCAL MEDICATIONS USED:  MARCAINE     SPECIMEN:  Source of Specimen:  terminal ileum and right colon  DISPOSITION OF SPECIMEN:  PATHOLOGY  COUNTS:  YES  TOURNIQUET:  * No tourniquets in log *  DICTATION: .Dragon Dictation   After informed consent was obtained the patient was brought to the operating room and placed in the supine position on the operating room table. After adequate induction of general anesthesia the patient's abdomen was prepped with ChloraPrep, allowed to dry, and draped in usual sterile manner. An appropriate timeout was performed. I was able to access the abdominal cavity using a 5 mm Optiview port and camera through a small stab incision in the left upper quadrant. The abdomen was then insufflated with carbon dioxide without difficulty. 2 other 5 mm ports were placed along the lower abdomen. These were placed under direct vision. Next the right colon was mobilized by incising its retroperitoneal attachments along the white line of Toldt. The proximal transverse colon was then separated from its attachments to the omentum and retroperitoneum sharply using the Harmonic scalpel. The terminal ileum was identified and noted was attached through some filmy attachments to the pelvis. These attachments were taken down sharply with the laparoscopic scissors. Once this was accomplished we're able to mobilize the terminal ileum right colon and proximal  transverse colon fairly readily. Next an upper midline incision was made with a 10 blade knife. The incision was carried through the skin and subcutaneous tissue sharply with electrocautery. The linea alba was also incised with electrocautery. The preperitoneal space was then probed bluntly with a hemostat until the peritoneum was opened and access was gained to the abdominal cavity. The rest of the incision was opened under direct vision with the electrocautery. A wound protector was applied. I was able to mobilize the entire right colon with proximal transverse colon and terminal ileum up into the wound. I chose a site to divide the small bowel where it was very mobile and could reach the transverse colon easily. The mesentery at this point was opened sharply with the electrocautery. A GIA-75 stapler was placed across the small bowel this point, clamped, and fired thereby dividing the small bowel between staple lines. Another site was chosen to divide the transverse colon just proximal to some of its major blood supply. The mesentery at this point was opened sharply with electrocautery. A GIA-75 stapler was placed across the transverse colon at this point, clamped, and fired thereby dividing the transverse colon between staple lines. The mesentery to the right colon and terminal ileum were then taken down sharply with the LigaSure. The main right colic vessel was also clamped with a Kelly clamp, divided and ligated with a 2-0 silk tie. Next the small bowel and transverse colon approximated each other fairly readily. A small opening was made on the antimesenteric border of each limb of transverse colon and small bowel with the electrocautery. Each limb of the GIA-75 stapler  was then placed down the appropriate limb of intestine, clamped, and fired thereby creating a nice widely patent enteroenterostomy. The common opening was closed with a TA 60 stapler. The staple line was then imbricated with several 2-0 silk  stitches including a 2-0 silk crotch stitch. The mesenteric defect was closed with multiple 2-0 silk stitches. Once this was accomplished the anastomosis appeared to be healthy without any tension and widely patent. The bowel was then placed back in the right upper quadrant. The operative area appeared very hemostatic. The abdomen was then irrigated with copious amounts of saline. At this point all gowns and gloves and drapes were changed. The fascia of the anterior abdominal wall was closed with 2 running #1 double-stranded loop PDS sutures. The subcutaneous tissue was irrigated with copious amounts of saline. The skin was then closed with staples. Sterile dressings were applied. The patient tolerated the procedure well. At the end of the case all needle sponge and instrument counts were correct. The patient was then awakened and taken to recovery in stable condition.  PLAN OF CARE: Admit to inpatient   PATIENT DISPOSITION:  PACU - hemodynamically stable.   Delay start of Pharmacological VTE agent (>24hrs) due to surgical blood loss or risk of bleeding: no

## 2016-08-28 NOTE — Interval H&P Note (Signed)
History and Physical Interval Note:  08/28/2016 11:32 AM  Bradley Short  has presented today for surgery, with the diagnosis of Right Colon Mass  The various methods of treatment have been discussed with the patient and family. After consideration of risks, benefits and other options for treatment, the patient has consented to  Procedure(s): LAPAROSCOPIC RIGHT COLECTOMY (Right) as a surgical intervention .  The patient's history has been reviewed, patient examined, no change in status, stable for surgery.  I have reviewed the patient's chart and labs.  Questions were answered to the patient's satisfaction.     TOTH III,Kiri Hinderliter S

## 2016-08-28 NOTE — Progress Notes (Signed)
ANTICOAGULATION CONSULT NOTE - Follow-up  Pharmacy Consult for Heparin Indication: atrial fibrillation, bridge while off Xarelto  Allergies  Allergen Reactions  . Diltiazem Hcl     REACTION: Looked like burned all over  . Lisinopril     REACTION: Looked like burned all over  . Metoprolol Succinate     REACTION: Looked like burned all over  . Propafenone Hcl     REACTION: Looked like burned all over    Patient Measurements: Height: 5\' 11"  (180.3 cm) Weight: 204 lb 3.2 oz (92.6 kg) IBW/kg (Calculated) : 75.3 Heparin Dosing Weight: 93kg  Vital Signs: Temp: 97.8 F (36.6 C) (07/02 1500) Temp Source: Oral (07/02 0556) BP: 124/53 (07/02 1645) Pulse Rate: 43 (07/02 1630)  Labs:  Recent Labs  08/25/16 2119  08/26/16 0806 08/26/16 1619 08/27/16 0518 08/27/16 2203 08/28/16 0527  HGB  --   < > 10.6*  --  10.2* 11.0* 11.2*  HCT  --   < > 31.5*  --  30.2* 32.5* 33.6*  PLT  --   < > 152  --  161 188 191  APTT 46*  --   --   --   --   --   --   HEPARINUNFRC 0.16*  --  0.28* 0.44 0.38  --  0.47  CREATININE  --   --  1.33*  --   --   --   --   < > = values in this interval not displayed.  Estimated Creatinine Clearance: 49.8 mL/min (A) (by C-G formula based on SCr of 1.33 mg/dL (H)).   Assessment: 62 yoM on chronic anticoagulation for Afib.  He was admitted with BRBPR on 6/25.  Xarelto was held and he had colonoscopy on 6/28.  No active bleeding found, but incidentally found cecal mass that was suspicious for malignancy (bx results pending).  Surgery was consulted for removal which is tentatively planned for Monday.  Pharmacy is consulted to bridge with heparin in the interim per cardiology recommendations.      Today: - Heparin off at 0930 for OR  Pt s/p lap assisted R colectomy  Goal of Therapy:  Heparin level 0.3-0.7 units/ml - but aiming for lower end of range due to risk of bleeding. Monitor platelets by anticoagulation protocol: Yes   Plan:  Per Dr. Marlou Starks, resuming  heparin at 0300 08/29/16 at 10 units/kg/hr = 950 units/hr. Will check HL 8 hrs after drip resumed at 11 am Eudelia Bunch, Pharm.D. [phone] 08/28/2016 4:57 PM

## 2016-08-28 NOTE — Anesthesia Postprocedure Evaluation (Signed)
Anesthesia Post Note  Patient: Bradley Short  Procedure(s) Performed: Procedure(s) (LRB): LAPAROSCOPIC ASSISTED RIGHT COLECTOMY (Right)     Patient location during evaluation: PACU Anesthesia Type: General Level of consciousness: awake Pain management: pain level controlled Vital Signs Assessment: post-procedure vital signs reviewed and stable Respiratory status: spontaneous breathing Cardiovascular status: stable Anesthetic complications: no    Last Vitals:  Vitals:   08/28/16 1600 08/28/16 1615  BP: (!) 115/54 (!) 124/54  Pulse: (!) 41 (!) 42  Resp: (!) 21 16  Temp:      Last Pain:  Vitals:   08/28/16 1600  TempSrc:   PainSc: 4                  Acen Craun

## 2016-08-29 LAB — BASIC METABOLIC PANEL
ANION GAP: 6 (ref 5–15)
BUN: 13 mg/dL (ref 6–20)
CHLORIDE: 107 mmol/L (ref 101–111)
CO2: 25 mmol/L (ref 22–32)
Calcium: 8.2 mg/dL — ABNORMAL LOW (ref 8.9–10.3)
Creatinine, Ser: 1.48 mg/dL — ABNORMAL HIGH (ref 0.61–1.24)
GFR calc non Af Amer: 42 mL/min — ABNORMAL LOW (ref >60)
GFR, EST AFRICAN AMERICAN: 49 mL/min — AB (ref >60)
Glucose, Bld: 176 mg/dL — ABNORMAL HIGH (ref 65–99)
POTASSIUM: 4 mmol/L (ref 3.5–5.1)
SODIUM: 138 mmol/L (ref 135–145)

## 2016-08-29 LAB — CBC
HCT: 32.8 % — ABNORMAL LOW (ref 39.0–52.0)
Hemoglobin: 10.7 g/dL — ABNORMAL LOW (ref 13.0–17.0)
MCH: 29.2 pg (ref 26.0–34.0)
MCHC: 32.6 g/dL (ref 30.0–36.0)
MCV: 89.4 fL (ref 78.0–100.0)
Platelets: 193 10*3/uL (ref 150–400)
RBC: 3.67 MIL/uL — ABNORMAL LOW (ref 4.22–5.81)
RDW: 14.5 % (ref 11.5–15.5)
WBC: 24.9 10*3/uL — ABNORMAL HIGH (ref 4.0–10.5)

## 2016-08-29 LAB — HEPARIN LEVEL (UNFRACTIONATED): HEPARIN UNFRACTIONATED: 0.15 [IU]/mL — AB (ref 0.30–0.70)

## 2016-08-29 MED ORDER — SODIUM CHLORIDE 0.9 % IV BOLUS (SEPSIS)
500.0000 mL | Freq: Once | INTRAVENOUS | Status: AC
  Administered 2016-08-29: 500 mL via INTRAVENOUS

## 2016-08-29 MED ORDER — KCL IN DEXTROSE-NACL 20-5-0.45 MEQ/L-%-% IV SOLN: INTRAVENOUS | Status: DC

## 2016-08-29 MED ORDER — FENTANYL CITRATE (PF) 100 MCG/2ML IJ SOLN
25.0000 ug | INTRAMUSCULAR | Status: DC | PRN
  Administered 2016-08-29: 25 ug via INTRAVENOUS
  Administered 2016-08-29: 50 ug via INTRAVENOUS
  Filled 2016-08-29 (×2): qty 2

## 2016-08-29 MED ORDER — KCL IN DEXTROSE-NACL 20-5-0.45 MEQ/L-%-% IV SOLN
INTRAVENOUS | Status: DC
  Administered 2016-08-29 – 2016-08-31 (×6): via INTRAVENOUS
  Filled 2016-08-29 (×6): qty 1000

## 2016-08-29 MED ORDER — ACETAMINOPHEN 10 MG/ML IV SOLN
1000.0000 mg | Freq: Three times a day (TID) | INTRAVENOUS | Status: AC
  Administered 2016-08-29 – 2016-08-30 (×3): 1000 mg via INTRAVENOUS
  Filled 2016-08-29 (×3): qty 100

## 2016-08-29 MED ORDER — DILTIAZEM HCL-DEXTROSE 100-5 MG/100ML-% IV SOLN (PREMIX)
5.0000 mg/h | INTRAVENOUS | Status: DC
  Administered 2016-08-29: 5 mg/h via INTRAVENOUS
  Administered 2016-08-30: 2.5 mg/h via INTRAVENOUS
  Administered 2016-08-30 – 2016-08-31 (×2): 5 mg/h via INTRAVENOUS
  Filled 2016-08-29 (×4): qty 100

## 2016-08-29 MED ORDER — HEPARIN (PORCINE) IN NACL 100-0.45 UNIT/ML-% IJ SOLN
1300.0000 [IU]/h | INTRAMUSCULAR | Status: DC
  Administered 2016-08-30: 1300 [IU]/h via INTRAVENOUS
  Filled 2016-08-29: qty 250

## 2016-08-29 MED ORDER — HEPARIN (PORCINE) IN NACL 100-0.45 UNIT/ML-% IJ SOLN: 950.0000 [IU]/h | INTRAMUSCULAR | Status: DC

## 2016-08-29 MED ORDER — MORPHINE SULFATE (PF) 2 MG/ML IV SOLN
1.0000 mg | INTRAVENOUS | Status: DC | PRN
  Administered 2016-08-29 – 2016-08-30 (×3): 1 mg via INTRAVENOUS
  Filled 2016-08-29 (×3): qty 1

## 2016-08-29 MED ORDER — ACETAMINOPHEN 500 MG PO TABS
1000.0000 mg | ORAL_TABLET | Freq: Three times a day (TID) | ORAL | Status: DC
Start: 2016-08-29 — End: 2016-08-29
  Administered 2016-08-29: 1000 mg via ORAL
  Filled 2016-08-29: qty 2

## 2016-08-29 MED ORDER — LORAZEPAM 2 MG/ML IJ SOLN
0.5000 mg | Freq: Once | INTRAMUSCULAR | Status: AC
  Administered 2016-08-29: 0.5 mg via INTRAVENOUS
  Filled 2016-08-29: qty 1

## 2016-08-29 NOTE — Progress Notes (Signed)
Progress Note  Patient Name: Bradley Short Date of Encounter: 08/29/2016  Primary Cardiologist: Hochrein, allred   Subjective   Patient up in the chair and feeling well this morning. He denies chest discomfort, shortness of breath, palpitations or lightheadedness. He slept well without orthopnea.  Inpatient Medications    Scheduled Meds: . acetaminophen  1,000 mg Oral Q8H  . allopurinol  100 mg Oral Daily  . feeding supplement (ENSURE ENLIVE)  237 mL Oral BID BM   Continuous Infusions: . amiodarone 30 mg/hr (08/29/16 0800)  . dextrose 5 % and 0.45 % NaCl with KCl 20 mEq/L    . heparin 950 Units/hr (08/29/16 0800)   PRN Meds: fentaNYL (SUBLIMAZE) injection, LORazepam, ondansetron **OR** ondansetron (ZOFRAN) IV   Vital Signs    Vitals:   08/29/16 0500 08/29/16 0600 08/29/16 0700 08/29/16 0800  BP: (!) 100/56 (!) 97/53 104/66 (!) 90/52  Pulse: (!) 115 (!) 117 (!) 117 (!) 131  Resp: (!) 23 (!) 21 19 (!) 27  Temp:   97.5 F (36.4 C)   TempSrc:   Oral   SpO2: 98% 97% 96% 94%  Weight:      Height:        Intake/Output Summary (Last 24 hours) at 08/29/16 0852 Last data filed at 08/29/16 0800  Gross per 24 hour  Intake          2650.04 ml  Output              635 ml  Net          2015.04 ml   Filed Weights   08/25/16 1640 08/26/16 0430 08/28/16 1700  Weight: 205 lb 11 oz (93.3 kg) 204 lb 3.2 oz (92.6 kg) 204 lb 2.3 oz (92.6 kg)    Telemetry    Afib With rates in the 110s overnight and 130s this morning - Personally Reviewed  ECG    No new tracings  Physical Exam   Affect appropriate Healthy:  appears stated age HEENT: normal Neck supple with no adenopathy JVP normal no bruits no thyromegaly Lungs clear with no wheezing and good diaphragmatic motion Heart:  S1/S2 no murmur, no rub, gallop or click PMI normal Abdomen: With surgical bandages, mildly tender with deep breathing and cough no bruit.  No HSM or HJR Distal pulses intact with no bruits No  edema Neuro non-focal Skin warm and dry No muscular weakness   Labs    Chemistry  Recent Labs Lab 08/23/16 0916 08/24/16 0700 08/25/16 0324 08/26/16 0806 08/29/16 0323  NA 140 143 140 140 138  K 3.7 3.7 3.5 3.5 4.0  CL 109 112* 111 109 107  CO2 22 24 23 24 25   GLUCOSE 172* 118* 120* 108* 176*  BUN 17 11 7 10 13   CREATININE 1.48* 1.36* 1.27* 1.33* 1.48*  CALCIUM 8.4* 8.3* 8.2* 8.5* 8.2*  PROT 6.3*  --   --   --   --   ALBUMIN 3.5 3.2*  --   --   --   AST 31  --   --   --   --   ALT 18  --   --   --   --   ALKPHOS 56  --   --   --   --   BILITOT 0.5  --   --   --   --   GFRNONAA 42* 47* 51* 48* 42*  GFRAA 49* 54* 59* 56* 49*  ANIONGAP 9 7 6 7  6  Hematology  Recent Labs Lab 08/27/16 2203 08/28/16 0527 08/29/16 0323  WBC 9.5 10.7* 24.9*  RBC 3.71* 3.74* 3.67*  HGB 11.0* 11.2* 10.7*  HCT 32.5* 33.6* 32.8*  MCV 87.6 89.8 89.4  MCH 29.6 29.9 29.2  MCHC 33.8 33.3 32.6  RDW 14.4 14.6 14.5  PLT 188 191 193    Cardiac EnzymesNo results for input(s): TROPONINI in the last 168 hours. No results for input(s): TROPIPOC in the last 168 hours.   BNPNo results for input(s): BNP, PROBNP in the last 168 hours.   DDimer No results for input(s): DDIMER in the last 168 hours.   Radiology    Ct Abdomen Pelvis Wo Contrast  Result Date: 08/27/2016 CLINICAL DATA:  Preop right colectomy.  GI bleed. EXAM: CT ABDOMEN AND PELVIS WITHOUT CONTRAST TECHNIQUE: Multidetector CT imaging of the abdomen and pelvis was performed following the standard protocol without IV contrast. COMPARISON:  08/21/2016 FINDINGS: Lower chest: No acute findings. Hepatobiliary: No focal hepatic abnormality. Gallbladder unremarkable. Pancreas: No focal abnormality or ductal dilatation. Spleen: No focal abnormality.  Normal size. Adrenals/Urinary Tract: Adrenal glands unremarkable. Bilateral renal parapelvic cysts and cortical thinning. No hydronephrosis. Urinary bladder unremarkable. Stomach/Bowel: Appendix  is normal. Scattered sigmoid diverticula. No active diverticulitis. Stomach and small bowel decompressed, unremarkable. Vascular/Lymphatic: Aortic and iliac calcifications. No aneurysm or adenopathy. Reproductive: Prostate enlargement. Other: No free fluid or free air. Musculoskeletal: No acute bony abnormality. IMPRESSION: Sigmoid diverticulosis.  No active diverticulitis. Aortoiliac atherosclerosis. Bilateral renal parapelvic cysts. No acute findings. Electronically Signed   By: Rolm Baptise M.D.   On: 08/27/2016 10:30   Dg Chest 2 View  Result Date: 08/27/2016 CLINICAL DATA:  Preop obstructive sleep apnea EXAM: CHEST  2 VIEW COMPARISON:  10/11/2010 FINDINGS: Heart and mediastinal contours are within normal limits. No focal opacities or effusions. No acute bony abnormality. IMPRESSION: No active cardiopulmonary disease. Electronically Signed   By: Rolm Baptise M.D.   On: 08/27/2016 11:01    Cardiac Studies   Echocardiogram 08/23/16 Study Conclusions - Procedure narrative: Transthoracic echocardiography. Image   quality was poor. The study was technically difficult, as a   result of poor sound wave transmission. - Left ventricle: The cavity size was normal. Wall thickness was   increased in a pattern of mild LVH. Systolic function was normal.   The estimated ejection fraction was in the range of 60% to 65%.   Although no diagnostic regional wall motion abnormality was   identified, this possibility cannot be completely excluded on the   basis of this study. Features are consistent with a pseudonormal   left ventricular filling pattern, with concomitant abnormal   relaxation and increased filling pressure (grade 2 diastolic   dysfunction). - Aortic valve: There was no stenosis. - Mitral valve: Mildly calcified annulus. There was trivial   regurgitation. - Left atrium: The atrium was mildly to moderately dilated. - Right ventricle: The cavity size was normal. Systolic function   was  normal. - Pulmonary arteries: No complete TR doppler jet so unable to   estimate PA systolic pressure. - Inferior vena cava: The vessel was normal in size. The   respirophasic diameter changes were in the normal range (>= 50%),   consistent with normal central venous pressure.  Impressions: - Normal LV size with mild LV hypertrophy. EF 60-65%. Moderate   diastolic dysfunction. Normal RV size and systolic function.   Moderate LAE. No significant valvular abnormalities.     Patient Profile     83 y.o.  male with long history of PAF, pulmonary vein ablation 2012, CHAD2VASC 3 on xarelto at home. Normal EF 60-65% with no history of CAD. Unclear reactions to a combination of meds at [facility] years ago and listed as intolerence to cardizem, beta blocker and propafenone: "Looked like he was burnt".  Cecal mass found on colonoscopy and pt underwent laparoscopic right colectomy on 08/28/16.  Assessment & Plan    1. PAF:  IV amiodarone infusing. Rates in the 110's overnight, 130's this morning. Needs better rate control. Blood pressures are soft. Patient is feeling well and tolerating this well at this point. Will consider inderal 10 mg PO TID per Dr. Johnsie Cancel recommendation as pt has previous allergy situation that is unclear. This is a shorter acting agent. Heparin infusing for stroke risk reduction. Resume home Xarelto when OK with surgery, likely 72 hrs post-op.   Echo yesterday showed normal LV systolic function and moderate diastolic function  With moderate LAE. No significant valvular abnormalities.    Signed, Daune Perch, NP  08/29/2016, 8:52 AM

## 2016-08-29 NOTE — Progress Notes (Signed)
ANTICOAGULATION CONSULT NOTE - Follow-up  Pharmacy Consult for Heparin Indication: atrial fibrillation, bridge while off Xarelto  Allergies  Allergen Reactions  . Diltiazem Hcl     REACTION: Looked like burned all over  . Lisinopril     REACTION: Looked like burned all over  . Metoprolol Succinate     REACTION: Looked like burned all over  . Propafenone Hcl     REACTION: Looked like burned all over    Patient Measurements: Height: 5\' 11"  (180.3 cm) Weight: 204 lb 2.3 oz (92.6 kg) IBW/kg (Calculated) : 75.3 Heparin Dosing Weight: 93kg  Vital Signs: Temp: 97.7 F (36.5 C) (07/03 1200) Temp Source: Oral (07/03 1200) BP: 87/48 (07/03 1300) Pulse Rate: 120 (07/03 1300)  Labs:  Recent Labs  08/27/16 0518 08/27/16 2203 08/28/16 0527 08/29/16 0323 08/29/16 1207  HGB 10.2* 11.0* 11.2* 10.7*  --   HCT 30.2* 32.5* 33.6* 32.8*  --   PLT 161 188 191 193  --   HEPARINUNFRC 0.38  --  0.47  --  <0.10*  CREATININE  --   --   --  1.48*  --     Estimated Creatinine Clearance: 44.7 mL/min (A) (by C-G formula based on SCr of 1.48 mg/dL (H)).   Assessment: 4 yoM on chronic anticoagulation for Afib.  He was admitted with BRBPR on 6/25.  Xarelto was held and he had colonoscopy on 6/28.  No active bleeding found, but incidentally found cecal mass that was suspicious for malignancy (bx results pending).  Surgery was consulted for removal which is tentatively planned for Monday.  Pharmacy is consulted to bridge with heparin in the interim per cardiology recommendations.    Significant events: 7/2 R colectomy for colon mass.  Heparin resumed at 03:00 7/3    Today:  Heparin resumed 12h from AET at 950 units/hr.  Initial heparin level following restart is SUBtherapeutic at < 0.10  No issues with infusion or bleeding reported  Patient previously therapeutic on heparin at 1200 units/hr  CBC: Hgb relatively stable post-op per this morning's labs. pltc WNL  Goal of Therapy:  Heparin  level 0.3-0.5 units/ml - but aiming for lower end of range due to risk of bleeding. Monitor platelets by anticoagulation protocol: Yes   Plan:   Increase heparin gtt to 1200 units/hr as patient previously therapeutic at this infusion rate  Check 8h heparin level  Daily heparin level and CBC  Monitor for bleeding  Await restart rivaroxaban when appropriate - note based on current renal function, patient should be on rivaroxaban 15mg  daily with meal  Doreene Eland, PharmD, BCPS.   Pager: [phone] 08/29/2016 1:52 PM

## 2016-08-29 NOTE — Progress Notes (Signed)
Progress Note  Patient Name: Bradley Short Date of Encounter: 08/29/2016  Primary Cardiologist: Hochrein, allred   Subjective   Sitting in chair looks remarkably well   Inpatient Medications    Scheduled Meds: . acetaminophen  1,000 mg Oral Q8H  . allopurinol  100 mg Oral Daily  . feeding supplement (ENSURE ENLIVE)  237 mL Oral BID BM   Continuous Infusions: . amiodarone 30 mg/hr (08/29/16 0918)  . dextrose 5 % and 0.45 % NaCl with KCl 20 mEq/L 75 mL/hr at 08/29/16 0915  . heparin 950 Units/hr (08/29/16 0900)   PRN Meds: fentaNYL (SUBLIMAZE) injection, LORazepam, morphine injection, ondansetron **OR** ondansetron (ZOFRAN) IV   Vital Signs    Vitals:   08/29/16 0600 08/29/16 0700 08/29/16 0800 08/29/16 0900  BP: (!) 97/53 104/66 (!) 90/52 (!) 92/47  Pulse: (!) 117 (!) 117 (!) 131 (!) 128  Resp: (!) 21 19 (!) 27 (!) 25  Temp:  97.5 F (36.4 C)    TempSrc:  Oral    SpO2: 97% 96% 94% 95%  Weight:      Height:        Intake/Output Summary (Last 24 hours) at 08/29/16 7893 Last data filed at 08/29/16 0900  Gross per 24 hour  Intake          2676.24 ml  Output              635 ml  Net          2041.24 ml   Filed Weights   08/25/16 1640 08/26/16 0430 08/28/16 1700  Weight: 93.3 kg (205 lb 11 oz) 92.6 kg (204 lb 3.2 oz) 92.6 kg (204 lb 2.3 oz)    Telemetry    Afib rate 90-100  - Personally Reviewed  ECG     NSR  - Personally Reviewed  Physical Exam   Affect appropriate Healthy:  appears stated age HEENT: normal Neck supple with no adenopathy JVP normal no bruits no thyromegaly Lungs clear with no wheezing and good diaphragmatic motion Heart:  S1/S2 no murmur, no rub, gallop or click PMI normal Abdomen: post colectomy no distension  no bruit.  No HSM or HJR Distal pulses intact with no bruits No edema Neuro non-focal Skin warm and dry No muscular weakness   Labs    Chemistry  Recent Labs Lab 08/23/16 0916 08/24/16 0700 08/25/16 0324  08/26/16 0806 08/29/16 0323  NA 140 143 140 140 138  K 3.7 3.7 3.5 3.5 4.0  CL 109 112* 111 109 107  CO2 22 24 23 24 25   GLUCOSE 172* 118* 120* 108* 176*  BUN 17 11 7 10 13   CREATININE 1.48* 1.36* 1.27* 1.33* 1.48*  CALCIUM 8.4* 8.3* 8.2* 8.5* 8.2*  PROT 6.3*  --   --   --   --   ALBUMIN 3.5 3.2*  --   --   --   AST 31  --   --   --   --   ALT 18  --   --   --   --   ALKPHOS 56  --   --   --   --   BILITOT 0.5  --   --   --   --   GFRNONAA 42* 47* 51* 48* 42*  GFRAA 49* 54* 59* 56* 49*  ANIONGAP 9 7 6 7 6      Hematology  Recent Labs Lab 08/27/16 2203 08/28/16 0527 08/29/16 0323  WBC 9.5 10.7* 24.9*  RBC  3.71* 3.74* 3.67*  HGB 11.0* 11.2* 10.7*  HCT 32.5* 33.6* 32.8*  MCV 87.6 89.8 89.4  MCH 29.6 29.9 29.2  MCHC 33.8 33.3 32.6  RDW 14.4 14.6 14.5  PLT 188 191 193    Cardiac EnzymesNo results for input(s): TROPONINI in the last 168 hours. No results for input(s): TROPIPOC in the last 168 hours.   BNPNo results for input(s): BNP, PROBNP in the last 168 hours.   DDimer No results for input(s): DDIMER in the last 168 hours.   Radiology    Ct Abdomen Pelvis Wo Contrast  Result Date: 08/27/2016 CLINICAL DATA:  Preop right colectomy.  GI bleed. EXAM: CT ABDOMEN AND PELVIS WITHOUT CONTRAST TECHNIQUE: Multidetector CT imaging of the abdomen and pelvis was performed following the standard protocol without IV contrast. COMPARISON:  08/21/2016 FINDINGS: Lower chest: No acute findings. Hepatobiliary: No focal hepatic abnormality. Gallbladder unremarkable. Pancreas: No focal abnormality or ductal dilatation. Spleen: No focal abnormality.  Normal size. Adrenals/Urinary Tract: Adrenal glands unremarkable. Bilateral renal parapelvic cysts and cortical thinning. No hydronephrosis. Urinary bladder unremarkable. Stomach/Bowel: Appendix is normal. Scattered sigmoid diverticula. No active diverticulitis. Stomach and small bowel decompressed, unremarkable. Vascular/Lymphatic: Aortic and  iliac calcifications. No aneurysm or adenopathy. Reproductive: Prostate enlargement. Other: No free fluid or free air. Musculoskeletal: No acute bony abnormality. IMPRESSION: Sigmoid diverticulosis.  No active diverticulitis. Aortoiliac atherosclerosis. Bilateral renal parapelvic cysts. No acute findings. Electronically Signed   By: Rolm Baptise M.D.   On: 08/27/2016 10:30   Dg Chest 2 View  Result Date: 08/27/2016 CLINICAL DATA:  Preop obstructive sleep apnea EXAM: CHEST  2 VIEW COMPARISON:  10/11/2010 FINDINGS: Heart and mediastinal contours are within normal limits. No focal opacities or effusions. No acute bony abnormality. IMPRESSION: No active cardiopulmonary disease. Electronically Signed   By: Rolm Baptise M.D.   On: 08/27/2016 11:01    Cardiac Studies      Patient Profile     83 y.o. male with PAF CHAD2VASC 3 on xarelto at home Hisotry of afib/flutter ablation 2012 Normal EF 60-65% no history of CAD Unclear reactions to a combination of meds at [facility] Years ago and listed as intolerence to cardizem beta blocker and propafenone. "Looked like he was burnt"  For OR colectomy 08/28/16   Assessment & Plan    1. PAF:  Not taking PO yet start low dose iv cardizem with no bolus Not clear what his reaction to oral cardizem was but should tolerate iv. Continue amiodarone will change to oral inderal when taking PO. On heparin at this time    Signed, Jenkins Rouge, MD  08/29/2016, 9:38 AM

## 2016-08-29 NOTE — Progress Notes (Signed)
PROGRESS NOTE   Bradley Short  UXL:[id number]    DOB: [birth year]    DOA: 08/21/2016  PCP: Manon Hilding, MD   I have briefly reviewed patients previous medical records in [place].  Brief Narrative:   84 PAF on Xarelto, HLD,  OSA on CPAP gout,  presented with multiple episodes of painless BRBPR, weakness, diaphoresis, went to [facility] ED where hemoglobin around 12 g, initially A. fib with RVR which improved with IV fluids,   CT abdomen and pelvis showed sigmoid diverticulosis without acute findings,   transferred to [facility] stepdown unit. Hemodynamically and hemoglobin stable.   [facility] consulted and patient underwent colonoscopy 6/28.   Cecal lesion noted-Gen. surgery consulted  Cardiology provided preop clearance and made recommendations regarding A. fib management that patient developed overnight prior to surgery.  Assessment & Plan:   Principal Problem:   Acute GI bleeding Active Problems:   Pure hypercholesterolemia   ATRIAL FIBRILLATION   Acute blood loss anemia   Gout   Leukocytosis   Thrombocytopenia (HCC)   CKD (chronic kidney disease), stage III   Syncope, vasovagal   H/O colectomy  Acute blood loss anemia: Secondary to GI bleed. Stable. Acute GI bleed, suspect LGIB:    Preop CT abdomen without contrast 7/1: Sigmoid diverticulosis without acute diverticulitis. No acute findings  Xarelto held (last dose 08/20/16). [facility] was consulted and after holding anticoagulation for a couple days, performed colonoscopy-detailed results as below.   As per GI follow-up, bleeding source likely diverticular in origin and has clinically resolved.  Cecal mass s/p R Colectomy 7/2:   Not amenable to remove endoscopically. GI and general surgery input appreciated and this will need to be  removed surgically.   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   D#0 s/p laparoscopic assisted right colectomy on  7/2.    Marland Kitchen  Thrombocytopenia: Secondary to acute blood loss. Resolved. Leukocytosis: Likely stress response. Resolved.  Syncope:   ? vasovagal due to acute GI bleed. Telemetry without significant arrhythmias as cause. 2-D echo: LVEF  60-65 percent. Moderate diastolic dysfunction. No significant valvular abnormalities. Mobilize. PT  recommends no follow-up at this time. Paroxysmal atrial fibrillation with RVR:  Not on rate control medications PTA.   Xarelto held Patient was placed on IV heparin drip for a couple days prior to surgery and did not have any  bleeding issues.   Cardiology indicated low to moderate risk for surgery. Developed A. fib with RVR in the 170s overnight  7/1.  Dr H Discussed with Dr. Johnsie Cancel who saw the patient and recommended adding IV amiodarone given    try Inderal 10 MG 3 times a day if needed for rate control. ? Allergies to meds-pt/family   Cardizem gtt started   Resume heparin postop and transition to Xarelto when okay with general surgery   Hyperlipidemia: Resumed medications post colonoscopy. Gout: No acute flare. Continue allopurinol. Stage III chronic kidney disease: Baseline creatinine not known. Creatinine stable in the 1.2-1.3 range.  Hypophosphatemia: Replaced.   DVT prophylaxis: SCDs. Now on IV heparin infusion per pharmacy-held prior to surgery. Code Status: Full Family Communication: Discussed in detail with patient's spouse at bedside. Disposition: Transferred to medical bed 6/29. To be determined postop.  Consultants:  [facility]  Gen. Surgery Cardiology  Procedures:  Colonoscopy 08/24/16: Impression:               - Preparation of the colon was fair.                           -  One 7 mm polyp in the cecum, removed with a hot                            snare. Complete resection. Polyp tissue not                            retrieved.                           - Likely malignant tumor in the cecum and at the                             appendiceal orifice. Biopsied. Tattooed.                           - Diverticulosis in the sigmoid colon, in the                            descending colon and at the splenic flexure.                           - Non-bleeding external hemorrhoids.                           - The examination was otherwise normal.  Pathology results:  Diagnosis Ileocecal valve, biopsy, mass - SESSILE SERRATED POLYP WITH HIGH GRADE DYSPLASIA AND SMALL FOCUS SUSPICIOUS FOR ADENOCARCINOMA.  Antimicrobials:  None    Subjective:  Looks well No new issues   Objective:  Vitals:   08/29/16 0305 08/29/16 0400 08/29/16 0500 08/29/16 0600  BP:  (!) 101/53 (!) 100/56 (!) 97/53  Pulse:  (!) 113 (!) 115 (!) 117  Resp:  (!) 33 (!) 23 (!) 21  Temp: 97.8 F (36.6 C)     TempSrc: Oral     SpO2:  94% 98% 97%  Weight:      Height:        Examination:  EOMI NCAT Neck soft and supple s1 s 2no m/r/g abd shows patch and post op changwes Neuro intact   Data Reviewed: I have personally reviewed following labs and imaging studies  CBC:  Recent Labs Lab 08/23/16 0916  08/26/16 0806 08/27/16 0518 08/27/16 2203 08/28/16 0527 08/29/16 0323  WBC 11.0*  < > 9.7 8.9 9.5 10.7* 24.9*  NEUTROABS 9.1*  --   --   --   --   --   --   HGB 11.1*  < > 10.6* 10.2* 11.0* 11.2* 10.7*  HCT 33.4*  < > 31.5* 30.2* 32.5* 33.6* 32.8*  MCV 89.3  < > 89.2 88.3 87.6 89.8 89.4  PLT 134*  < > 152 161 188 191 193  < > = values in this interval not displayed. Basic Metabolic Panel:  Recent Labs Lab 08/23/16 0916 08/24/16 0700 08/25/16 0324 08/26/16 0806 08/29/16 0323  NA 140 143 140 140 138  K 3.7 3.7 3.5 3.5 4.0  CL 109 112* 111 109 107  CO2 22 24 23 24 25   GLUCOSE 172* 118* 120* 108* 176*  BUN 17 11 7 10 13   CREATININE 1.48* 1.36* 1.27* 1.33* 1.48*  CALCIUM 8.4* 8.3* 8.2* 8.5* 8.2*  MG 2.1  --   --   --   --  PHOS 1.9* 1.9*  1.9* 2.9  --   --    Liver Function Tests:  Recent Labs Lab 08/23/16 0916  08/24/16 0700  AST 31  --   ALT 18  --   ALKPHOS 56  --   BILITOT 0.5  --   PROT 6.3*  --   ALBUMIN 3.5 3.2*   Coagulation Profile: No results for input(s): INR, PROTIME in the last 168 hours. CBG:  Recent Labs Lab 08/24/16 1659 08/24/16 2319 08/25/16 0641 08/25/16 0736 08/25/16 1521  GLUCAP 122* 108* 122* 116* 123*    Recent Results (from the past 240 hour(s))  MRSA PCR Screening     Status: None   Collection Time: 08/21/16 10:20 PM  Result Value Ref Range Status   MRSA by PCR NEGATIVE NEGATIVE Final    Comment:        The GeneXpert MRSA Assay (FDA approved for NASAL specimens only), is one component of a comprehensive MRSA colonization surveillance program. It is not intended to diagnose MRSA infection nor to guide or monitor treatment for MRSA infections.   Surgical pcr screen     Status: None   Collection Time: 08/28/16  6:46 AM  Result Value Ref Range Status   MRSA, PCR NEGATIVE NEGATIVE Final   Staphylococcus aureus NEGATIVE NEGATIVE Final    Comment:        The Xpert SA Assay (FDA approved for NASAL specimens in patients over 66 years of age), is one component of a comprehensive surveillance program.  Test performance has been validated by [facility] for patients greater than or equal to 21 year old. It is not intended to diagnose infection nor to guide or monitor treatment.          Radiology Studies: Ct Abdomen Pelvis Wo Contrast  Result Date: 08/27/2016 CLINICAL DATA:  Preop right colectomy.  GI bleed. EXAM: CT ABDOMEN AND PELVIS WITHOUT CONTRAST TECHNIQUE: Multidetector CT imaging of the abdomen and pelvis was performed following the standard protocol without IV contrast. COMPARISON:  08/21/2016 FINDINGS: Lower chest: No acute findings. Hepatobiliary: No focal hepatic abnormality. Gallbladder unremarkable. Pancreas: No focal abnormality or ductal dilatation. Spleen: No focal abnormality.  Normal size. Adrenals/Urinary Tract: Adrenal glands  unremarkable. Bilateral renal parapelvic cysts and cortical thinning. No hydronephrosis. Urinary bladder unremarkable. Stomach/Bowel: Appendix is normal. Scattered sigmoid diverticula. No active diverticulitis. Stomach and small bowel decompressed, unremarkable. Vascular/Lymphatic: Aortic and iliac calcifications. No aneurysm or adenopathy. Reproductive: Prostate enlargement. Other: No free fluid or free air. Musculoskeletal: No acute bony abnormality. IMPRESSION: Sigmoid diverticulosis.  No active diverticulitis. Aortoiliac atherosclerosis. Bilateral renal parapelvic cysts. No acute findings. Electronically Signed   By: Rolm Baptise M.D.   On: 08/27/2016 10:30   Dg Chest 2 View  Result Date: 08/27/2016 CLINICAL DATA:  Preop obstructive sleep apnea EXAM: CHEST  2 VIEW COMPARISON:  10/11/2010 FINDINGS: Heart and mediastinal contours are within normal limits. No focal opacities or effusions. No acute bony abnormality. IMPRESSION: No active cardiopulmonary disease. Electronically Signed   By: Rolm Baptise M.D.   On: 08/27/2016 11:01        Scheduled Meds: . acetaminophen  1,000 mg Oral Q8H  . allopurinol  100 mg Oral Daily  . feeding supplement (ENSURE ENLIVE)  237 mL Oral BID BM   Continuous Infusions: . amiodarone 30 mg/hr (08/29/16 1600)  . dextrose 5 % and 0.45 % NaCl with KCl 20 mEq/L 75 mL/hr at 08/29/16 1600  . diltiazem (CARDIZEM) infusion 5 mg/hr (08/29/16 1600)  .  heparin 1,200 Units/hr (08/29/16 1600)     LOS: 8 days    Verneita Griffes, MD [facility] [phone]   08/29/2016, 7:37 AM

## 2016-08-29 NOTE — Progress Notes (Signed)
PT Cancellation Note  Patient Details Name: Bradley Short MRN: [id number] DOB: 05-25-1932   Cancelled Treatment:    Reason Eval/Treat Not Completed: Other (comment)Attempted PT session at 915. Patient in recliner, has out of town visitor and taking tylenol and declined visit. .Continue PT as schedule allows.   Claretha Cooper 08/29/2016, 5:17 PM Tresa Endo PT [phone]

## 2016-08-29 NOTE — Progress Notes (Signed)
[facility] Progress Note  1 Day Post-Op  Subjective: CC:  Wife at bedside. Patient reports abdominal soreness this morning along with intermittint abdominal cramping. Denies fever, chills, nausea, or vomiting. Denies flatus or BM. Last BM was prior to colonoscopy on 6/28 and was bloody.  He denies any known allergies to narcotic medications, however wishes to attempt pain control with scheduled tylenol prior to trying PO hydrocodone or oxycodone.  Objective: Vital signs in last 24 hours: Temp:  [97 F (36.1 C)-97.8 F (36.6 C)] 97.8 F (36.6 C) (07/03 0305) Pulse Rate:  [37-118] 117 (07/03 0700) Resp:  [12-42] 19 (07/03 0700) BP: (85-124)/(44-69) 104/66 (07/03 0700) SpO2:  [93 %-100 %] 96 % (07/03 0700) Weight:  [92.6 kg (204 lb 2.3 oz)] 92.6 kg (204 lb 2.3 oz) (07/02 1700) Last BM Date: 07/27/16  Intake/Output from previous day: 07/02 0701 - 07/03 0700 In: 2631.3 [I.V.:2131.3; IV Piggyback:500] Out: 635 [Urine:610; Blood:25] Intake/Output this shift: No intake/output data recorded.  PE: Gen:  Alert, NAD, pleasant and cooperative Card:  Irregularly irregular, pedal pulses 2+ BL Pulm:  Normal effort, clear to auscultation bilaterally Abd: Soft, non-tender, mild distention, bowel sounds hypoactive, incisions C/D/I Skin: warm and dry, no rashes  Psych: A&Ox3   Lab Results:   Recent Labs  08/28/16 0527 08/29/16 0323  WBC 10.7* 24.9*  HGB 11.2* 10.7*  HCT 33.6* 32.8*  PLT 191 193   BMET  Recent Labs  08/26/16 0806 08/29/16 0323  NA 140 138  K 3.5 4.0  CL 109 107  CO2 24 25  GLUCOSE 108* 176*  BUN 10 13  CREATININE 1.33* 1.48*  CALCIUM 8.5* 8.2*   PT/INR No results for input(s): LABPROT, INR in the last 72 hours. CMP     Component Value Date/Time   NA 138 08/29/2016 0323   K 4.0 08/29/2016 0323   CL 107 08/29/2016 0323   CO2 25 08/29/2016 0323   GLUCOSE 176 (H) 08/29/2016 0323   BUN 13 08/29/2016 0323   CREATININE 1.48 (H) 08/29/2016  0323   CALCIUM 8.2 (L) 08/29/2016 0323   PROT 6.3 (L) 08/23/2016 0916   ALBUMIN 3.2 (L) 08/24/2016 0700   AST 31 08/23/2016 0916   ALT 18 08/23/2016 0916   ALKPHOS 56 08/23/2016 0916   BILITOT 0.5 08/23/2016 0916   GFRNONAA 42 (L) 08/29/2016 0323   GFRAA 49 (L) 08/29/2016 0323   Lipase  No results found for: LIPASE     Studies/Results: Ct Abdomen Pelvis Wo Contrast  Result Date: 08/27/2016 CLINICAL DATA:  Preop right colectomy.  GI bleed. EXAM: CT ABDOMEN AND PELVIS WITHOUT CONTRAST TECHNIQUE: Multidetector CT imaging of the abdomen and pelvis was performed following the standard protocol without IV contrast. COMPARISON:  08/21/2016 FINDINGS: Lower chest: No acute findings. Hepatobiliary: No focal hepatic abnormality. Gallbladder unremarkable. Pancreas: No focal abnormality or ductal dilatation. Spleen: No focal abnormality.  Normal size. Adrenals/Urinary Tract: Adrenal glands unremarkable. Bilateral renal parapelvic cysts and cortical thinning. No hydronephrosis. Urinary bladder unremarkable. Stomach/Bowel: Appendix is normal. Scattered sigmoid diverticula. No active diverticulitis. Stomach and small bowel decompressed, unremarkable. Vascular/Lymphatic: Aortic and iliac calcifications. No aneurysm or adenopathy. Reproductive: Prostate enlargement. Other: No free fluid or free air. Musculoskeletal: No acute bony abnormality. IMPRESSION: Sigmoid diverticulosis.  No active diverticulitis. Aortoiliac atherosclerosis. Bilateral renal parapelvic cysts. No acute findings. Electronically Signed   By: Rolm Baptise M.D.   On: 08/27/2016 10:30   Dg Chest 2 View  Result Date: 08/27/2016 CLINICAL DATA:  Preop obstructive  sleep apnea EXAM: CHEST  2 VIEW COMPARISON:  10/11/2010 FINDINGS: Heart and mediastinal contours are within normal limits. No focal opacities or effusions. No acute bony abnormality. IMPRESSION: No active cardiopulmonary disease. Electronically Signed   By: Rolm Baptise M.D.   On:  08/27/2016 11:01    Anti-infectives: Anti-infectives    Start     Dose/Rate Route Frequency Ordered Stop   08/28/16 1133  cefoTEtan in Dextrose 5% (CEFOTAN) 2-2.08 GM-% IVPB    Comments:  Carleene Cooper   : cabinet override      08/28/16 1133 08/28/16 1145   08/28/16 0945  cefoTEtan (CEFOTAN) 2 g in dextrose 5 % 50 mL IVPB     2 g 100 mL/hr over 30 Minutes Intravenous On call to O.R. 08/28/16 0941 08/28/16 1710     Assessment/Plan Acute lower GIB - etiology diverticular disease vs malignancy  S/P COLONOSCOPY 6/28: Cecal/appendiceal orifice mass - BX shows high grade dysplasia with small focus conerning for adenocarcinoma  S/P LAPAROSCOPIC ASSISTED RIGHT COLECTOMY 08/28/16 Dr. Autumn Messing III -  follow surgical pathology  -  Await bowel function and continue NPO/ice chips -  IS/pulm toilet, mobilize -  pain control: Tylenol 1,000 mg q 8h, IV Morphine for breakthrough only  -  continue foley for another 24h   ABL anemia - hgb 10.7, Hct 32.8, follow Paroxysmal atrial fibrillation - Xarelto held, on heparin gtt HLD CKD, stage III - unknown baseline SCr, today SCr 1.48  Gout - allopurinol FEN - NPO/ice chips, IVF ID -  perioperative cefotetan 7/2; WBC 24.9, repeat CBC in AM VTE - SCD's, heparin gtt    LOS: 8 days    Jill Alexanders , [facility] 08/29/2016, 7:46 AM Pager: [phone] Consults: [phone] Mon-Fri 7:00 am-4:30 pm Sat-Sun 7:00 am-11:30 am

## 2016-08-30 LAB — CBC
HCT: 29 % — ABNORMAL LOW (ref 39.0–52.0)
Hemoglobin: 9.7 g/dL — ABNORMAL LOW (ref 13.0–17.0)
MCH: 30.1 pg (ref 26.0–34.0)
MCHC: 33.4 g/dL (ref 30.0–36.0)
MCV: 90.1 fL (ref 78.0–100.0)
Platelets: 166 K/uL (ref 150–400)
RBC: 3.22 MIL/uL — ABNORMAL LOW (ref 4.22–5.81)
RDW: 14.9 % (ref 11.5–15.5)
WBC: 19.1 K/uL — ABNORMAL HIGH (ref 4.0–10.5)

## 2016-08-30 LAB — HEPARIN LEVEL (UNFRACTIONATED)
HEPARIN UNFRACTIONATED: 0.27 [IU]/mL — AB (ref 0.30–0.70)
Heparin Unfractionated: 0.18 [IU]/mL — ABNORMAL LOW (ref 0.30–0.70)

## 2016-08-30 LAB — BASIC METABOLIC PANEL
Anion gap: 6 (ref 5–15)
BUN: 15 mg/dL (ref 6–20)
CALCIUM: 8.1 mg/dL — AB (ref 8.9–10.3)
CHLORIDE: 108 mmol/L (ref 101–111)
CO2: 23 mmol/L (ref 22–32)
CREATININE: 1.42 mg/dL — AB (ref 0.61–1.24)
GFR calc non Af Amer: 44 mL/min — ABNORMAL LOW (ref >60)
GFR, EST AFRICAN AMERICAN: 52 mL/min — AB (ref >60)
Glucose, Bld: 158 mg/dL — ABNORMAL HIGH (ref 65–99)
Potassium: 3.8 mmol/L (ref 3.5–5.1)
SODIUM: 137 mmol/L (ref 135–145)

## 2016-08-30 LAB — MAGNESIUM: Magnesium: 2.1 mg/dL (ref 1.7–2.4)

## 2016-08-30 LAB — PHOSPHORUS: PHOSPHORUS: 2.2 mg/dL — AB (ref 2.5–4.6)

## 2016-08-30 MED ORDER — ACETAMINOPHEN 10 MG/ML IV SOLN
1000.0000 mg | Freq: Three times a day (TID) | INTRAVENOUS | Status: AC
  Administered 2016-08-30 – 2016-08-31 (×3): 1000 mg via INTRAVENOUS
  Filled 2016-08-30 (×3): qty 100

## 2016-08-30 MED ORDER — HEPARIN (PORCINE) IN NACL 100-0.45 UNIT/ML-% IJ SOLN
1500.0000 [IU]/h | INTRAMUSCULAR | Status: DC
  Administered 2016-08-30: 1500 [IU]/h via INTRAVENOUS

## 2016-08-30 MED ORDER — HEPARIN (PORCINE) IN NACL 100-0.45 UNIT/ML-% IJ SOLN
1600.0000 [IU]/h | INTRAMUSCULAR | Status: DC
  Administered 2016-08-30: 1600 [IU]/h via INTRAVENOUS
  Filled 2016-08-30: qty 250

## 2016-08-30 MED ORDER — LORAZEPAM 2 MG/ML IJ SOLN
0.5000 mg | Freq: Four times a day (QID) | INTRAMUSCULAR | Status: DC | PRN
Start: 2016-08-30 — End: 2016-08-31
  Administered 2016-08-30 (×2): 0.5 mg via INTRAVENOUS
  Filled 2016-08-30 (×2): qty 1

## 2016-08-30 NOTE — Progress Notes (Signed)
Patient ID: [id number] Bradley Short, male   DOB: [birth year], 83 y.o.   MRN: [id number] [facility] Surgery Progress Note  2 Days Post-Op  Subjective: CC:  Pt reports 0 pain.  Had a small amount of flatus this AM.  Had a bad night though between foley and anxiety.  Also had a bit of respiratory distress this AM.  He and wife relate this to anxiety.    Objective: Vital signs in last 24 hours: Temp:  [97.7 F (36.5 C)-99 F (37.2 C)] 97.7 F (36.5 C) (07/04 0320) Pulse Rate:  [75-130] 78 (07/04 0700) Resp:  [12-27] 16 (07/04 0700) BP: (85-131)/(35-81) 131/52 (07/04 0700) SpO2:  [92 %-98 %] 96 % (07/04 0700) Last BM Date: 08/25/16  Intake/Output from previous day: 07/03 0701 - 07/04 0700 In: 2978.6 [I.V.:2478.6; IV Piggyback:500] Out: 365 [Urine:365] Intake/Output this shift: No intake/output data recorded.  PE: Gen:  Alert, NAD, pleasant and cooperative Card:  Irregularly irregular, pedal pulses 2+ BL Pulm:  Normal effort Abd: Soft, non-tender, mild distention, incisions C/D/I Skin: warm and dry, no rashes  Psych: A&Ox3   Lab Results:   Recent Labs  08/29/16 0323 08/30/16 0256  WBC 24.9* 19.1*  HGB 10.7* 9.7*  HCT 32.8* 29.0*  PLT 193 166   BMET  Recent Labs  08/29/16 0323 08/30/16 0256  NA 138 137  K 4.0 3.8  CL 107 108  CO2 25 23  GLUCOSE 176* 158*  BUN 13 15  CREATININE 1.48* 1.42*  CALCIUM 8.2* 8.1*   PT/INR No results for input(s): LABPROT, INR in the last 72 hours. CMP     Component Value Date/Time   NA 137 08/30/2016 0256   K 3.8 08/30/2016 0256   CL 108 08/30/2016 0256   CO2 23 08/30/2016 0256   GLUCOSE 158 (H) 08/30/2016 0256   BUN 15 08/30/2016 0256   CREATININE 1.42 (H) 08/30/2016 0256   CALCIUM 8.1 (L) 08/30/2016 0256   PROT 6.3 (L) 08/23/2016 0916   ALBUMIN 3.2 (L) 08/24/2016 0700   AST 31 08/23/2016 0916   ALT 18 08/23/2016 0916   ALKPHOS 56 08/23/2016 0916   BILITOT 0.5 08/23/2016 0916   GFRNONAA 44 (L) 08/30/2016 0256   GFRAA 52  (L) 08/30/2016 0256   Lipase  No results found for: LIPASE     Studies/Results: No results found.  Anti-infectives: Anti-infectives    Start     Dose/Rate Route Frequency Ordered Stop   08/28/16 1133  cefoTEtan in Dextrose 5% (CEFOTAN) 2-2.08 GM-% IVPB    Comments:  [name], Bella Kennedy   : cabinet override      08/28/16 1133 08/28/16 1145   08/28/16 0945  cefoTEtan (CEFOTAN) 2 g in dextrose 5 % 50 mL IVPB     2 g 100 mL/hr over 30 Minutes Intravenous On call to O.R. 08/28/16 0941 08/28/16 1710     Assessment/Plan Acute lower GIB - etiology diverticular disease vs malignancy  S/P COLONOSCOPY 6/28: Cecal/appendiceal orifice mass - BX shows high grade dysplasia with small focus conerning for adenocarcinoma  S/P LAPAROSCOPIC ASSISTED RIGHT COLECTOMY 08/28/16 Dr. Autumn Messing III -  follow surgical pathology  -  Sips and ice chips, but would not to clear tray due to distention. -  IS/pulm toilet, mobilize -  pain control: Tylenol 1,000 mg q 8h, IV Morphine for breakthrough only   ABL anemia - hgb 9.7, Hct 29.0, follow. Paroxysmal atrial fibrillation - Xarelto held, on heparin gtt.  On amiodarone gtt.  Cards following.  HLD CKD, stage III - unknown baseline SCr, today SCr 1.48  Gout - allopurinol FEN - NPO/ice chips, IVF ID -  perioperative cefotetan 7/2; WBC trending down now 19.1 VTE - SCD's, heparin gtt Stepdown today.   LOS: 9 days    [place] , [facility] 08/30/2016, 8:21 AM

## 2016-08-30 NOTE — Progress Notes (Signed)
ANTICOAGULATION CONSULT NOTE - Follow Up Consult  Pharmacy Consult for Heparin Indication: atrial fibrillation, bridge while off Xarelto  Allergies  Allergen Reactions  . Diltiazem Hcl     REACTION: Looked like burned all over  . Lisinopril     REACTION: Looked like burned all over  . Metoprolol Succinate     REACTION: Looked like burned all over  . Propafenone Hcl     REACTION: Looked like burned all over    Patient Measurements: Height: 5\' 11"  (180.3 cm) Weight: 204 lb 2.3 oz (92.6 kg) IBW/kg (Calculated) : 75.3 Heparin Dosing Weight:   Vital Signs: Temp: 97.7 F (36.5 C) (07/04 0320) Temp Source: Oral (07/04 0320) BP: 129/74 (07/04 0500) Pulse Rate: 78 (07/04 0500)  Labs:  Recent Labs  08/28/16 0527 08/29/16 0323 08/29/16 1207 08/29/16 2135 08/30/16 0256  HGB 11.2* 10.7*  --   --  9.7*  HCT 33.6* 32.8*  --   --  29.0*  PLT 191 193  --   --  166  HEPARINUNFRC 0.47  --  <0.10* 0.15*  --   CREATININE  --  1.48*  --   --  1.42*    Estimated Creatinine Clearance: 46.6 mL/min (A) (by C-G formula based on SCr of 1.42 mg/dL (H)).   Medications:  Infusions:  . acetaminophen Stopped (08/29/16 2201)  . amiodarone 30 mg/hr (08/30/16 0500)  . dextrose 5 % and 0.45 % NaCl with KCl 20 mEq/L 100 mL/hr at 08/30/16 0500  . diltiazem (CARDIZEM) infusion 2.5 mg/hr (08/30/16 0500)  . heparin 1,300 Units/hr (08/30/16 0500)    Assessment: Patient with low heparin level.  No heparin issues per RN.  Level was drawn a little early.  But will still address low level.  Goal of Therapy:  Heparin level 0.3-0.5 units/ml Monitor platelets by anticoagulation protocol: Yes   Plan:  Increase heparin to 1300 units/hr (done) Recheck level at 0800  Tyler Deis, [place] 08/30/2016,5:29 AM

## 2016-08-30 NOTE — Progress Notes (Signed)
Progress Note  Patient Name: Bradley Short Date of Encounter: 08/30/2016  Primary Cardiologist: Patsi Sears  Subjective   69 you with PAF, Colon cancer , now s/p right colectomy  Has gone back into atrial fib Is still NPO,  Is on heparin drip and cardizem drip, amio drip   Is slowly improving    Inpatient Medications    Scheduled Meds: . allopurinol  100 mg Oral Daily  . feeding supplement (ENSURE ENLIVE)  237 mL Oral BID BM   Continuous Infusions: . acetaminophen 1,000 mg (08/30/16 0533)  . amiodarone 30 mg/hr (08/30/16 0500)  . dextrose 5 % and 0.45 % NaCl with KCl 20 mEq/L 100 mL/hr at 08/30/16 0500  . diltiazem (CARDIZEM) infusion Stopped (08/30/16 4888)  . heparin 1,300 Units/hr (08/30/16 0500)   PRN Meds: fentaNYL (SUBLIMAZE) injection, LORazepam, morphine injection, ondansetron **OR** ondansetron (ZOFRAN) IV   Vital Signs    Vitals:   08/30/16 0400 08/30/16 0500 08/30/16 0600 08/30/16 0700  BP: 131/81 129/74 (!) 118/49 (!) 131/52  Pulse: 75 78 77 78  Resp: 16 19 18 16   Temp:      TempSrc:      SpO2: 98% 95% 97% 96%  Weight:      Height:        Intake/Output Summary (Last 24 hours) at 08/30/16 0813 Last data filed at 08/30/16 0500  Gross per 24 hour  Intake          2959.86 ml  Output              365 ml  Net          2594.86 ml   Filed Weights   08/25/16 1640 08/26/16 0430 08/28/16 1700  Weight: 205 lb 11 oz (93.3 kg) 204 lb 3.2 oz (92.6 kg) 204 lb 2.3 oz (92.6 kg)    Telemetry    Atrial fib - Personally Reviewed  ECG     July 1,  Atrial fib  - Personally Reviewed  Physical Exam   GEN: mild  distress. ,  Appears uncomfortable  Neck: No JVD Cardiac: Irreg. Irreg. , no murmurs, rubs, or gallops.  Respiratory: Clear to auscultation bilaterally. GI: very few BS  MS: No edema; No deformity. Neuro:  Nonfocal  Psych: Normal affect   Labs    Chemistry Recent Labs Lab 08/23/16 0916 08/24/16 0700  08/26/16 0806 08/29/16 0323  08/30/16 0256  NA 140 143  < > 140 138 137  K 3.7 3.7  < > 3.5 4.0 3.8  CL 109 112*  < > 109 107 108  CO2 22 24  < > 24 25 23   GLUCOSE 172* 118*  < > 108* 176* 158*  BUN 17 11  < > 10 13 15   CREATININE 1.48* 1.36*  < > 1.33* 1.48* 1.42*  CALCIUM 8.4* 8.3*  < > 8.5* 8.2* 8.1*  PROT 6.3*  --   --   --   --   --   ALBUMIN 3.5 3.2*  --   --   --   --   AST 31  --   --   --   --   --   ALT 18  --   --   --   --   --   ALKPHOS 56  --   --   --   --   --   BILITOT 0.5  --   --   --   --   --  GFRNONAA 42* 47*  < > 48* 42* 44*  GFRAA 49* 54*  < > 56* 49* 52*  ANIONGAP 9 7  < > 7 6 6   < > = values in this interval not displayed.   Hematology Recent Labs Lab 08/28/16 0527 08/29/16 0323 08/30/16 0256  WBC 10.7* 24.9* 19.1*  RBC 3.74* 3.67* 3.22*  HGB 11.2* 10.7* 9.7*  HCT 33.6* 32.8* 29.0*  MCV 89.8 89.4 90.1  MCH 29.9 29.2 30.1  MCHC 33.3 32.6 33.4  RDW 14.6 14.5 14.9  PLT 191 193 166    Cardiac EnzymesNo results for input(s): TROPONINI in the last 168 hours. No results for input(s): TROPIPOC in the last 168 hours.   BNPNo results for input(s): BNP, PROBNP in the last 168 hours.   DDimer No results for input(s): DDIMER in the last 168 hours.   Radiology    No results found.  Cardiac Studies     Patient Profile     83 y.o. male with hx of PAF.  Now s/p right hemicolectomy   Assessment & Plan    1.  Atrial fib:   Paroxysmal . On IV amio and dilt,  Also on heparin until he is taking PO . Overall is very stable from a cardiac standpoint.   He may convert back to NSR as he heals from a surgical standpoint.    Signed, Mertie Moores, MD  08/30/2016, 8:13 AM

## 2016-08-30 NOTE — Progress Notes (Signed)
ANTICOAGULATION CONSULT NOTE - Follow-up  Pharmacy Consult for Heparin Indication: atrial fibrillation, bridge while off Xarelto  Allergies  Allergen Reactions  . Diltiazem Hcl     REACTION: Looked like burned all over  . Lisinopril     REACTION: Looked like burned all over  . Metoprolol Succinate     REACTION: Looked like burned all over  . Propafenone Hcl     REACTION: Looked like burned all over    Patient Measurements: Height: 5\' 11"  (180.3 cm) Weight: 204 lb 2.3 oz (92.6 kg) IBW/kg (Calculated) : 75.3 Heparin Dosing Weight: 93kg  Vital Signs: Temp: 98.2 F (36.8 C) (07/04 1910) Temp Source: Oral (07/04 1910) BP: 110/61 (07/04 1557) Pulse Rate: 105 (07/04 1557)  Labs:  Recent Labs  08/28/16 0527 08/29/16 0323  08/29/16 2135 08/30/16 0256 08/30/16 0804 08/30/16 1810  HGB 11.2* 10.7*  --   --  9.7*  --   --   HCT 33.6* 32.8*  --   --  29.0*  --   --   PLT 191 193  --   --  166  --   --   HEPARINUNFRC 0.47  --   < > 0.15*  --  0.18* 0.27*  CREATININE  --  1.48*  --   --  1.42*  --   --   < > = values in this interval not displayed.  Estimated Creatinine Clearance: 46.6 mL/min (A) (by C-G formula based on SCr of 1.42 mg/dL (H)).   Assessment: 46 yoM on chronic anticoagulation for Afib.  He was admitted with BRBPR on 6/25.  Xarelto was held and he had colonoscopy on 6/28.  No active bleeding found, but incidentally found cecal mass that was suspicious for malignancy (bx results pending).  Surgery was consulted for removal which is tentatively planned for Monday.  Pharmacy is consulted to bridge with heparin in the interim per cardiology recommendations.    Significant events: 7/2 R colectomy for colon mass.  Heparin resumed at 03:00 7/3    Today:  Heparin level remains SUBtherapetic on heparin at 1300 units/hr  No issues with infusion or bleeding per RN  Pre-op patient was therapeutic on 1200 units/hr  CBC: Hgb decreased this am (only 98mL est blood loss  per OR report 7/2). pltc WNL  PM: HL is 0.27, subtherapeutic. No line or bleeding issues per RN.  Goal of Therapy:  Heparin level 0.3-0.5 units/ml - but aiming for lower end of range due to risk of bleeding. Monitor platelets by anticoagulation protocol: Yes   Plan:   Increase heparin gtt to 1600 units/hr   Increasing more conservatively due to recent surgery  Check 8h heparin level  Daily heparin level and CBC  Monitor for bleeding  Await restart rivaroxaban when appropriate - note based on current renal function, patient should be on rivaroxaban 15mg  daily with meal (was on 20mg  daily at home)   Royetta Asal, PharmD, BCPS Pager [phone] 08/30/2016 7:16 PM

## 2016-08-30 NOTE — Progress Notes (Signed)
ANTICOAGULATION CONSULT NOTE - Follow-up  Pharmacy Consult for Heparin Indication: atrial fibrillation, bridge while off Xarelto  Allergies  Allergen Reactions  . Diltiazem Hcl     REACTION: Looked like burned all over  . Lisinopril     REACTION: Looked like burned all over  . Metoprolol Succinate     REACTION: Looked like burned all over  . Propafenone Hcl     REACTION: Looked like burned all over    Patient Measurements: Height: 5\' 11"  (180.3 cm) Weight: 204 lb 2.3 oz (92.6 kg) IBW/kg (Calculated) : 75.3 Heparin Dosing Weight: 93kg  Vital Signs: Temp: 97.4 F (36.3 C) (07/04 0800) Temp Source: Oral (07/04 0800) BP: 130/78 (07/04 0800) Pulse Rate: 85 (07/04 0800)  Labs:  Recent Labs  08/28/16 0527 08/29/16 0323 08/29/16 1207 08/29/16 2135 08/30/16 0256 08/30/16 0804  HGB 11.2* 10.7*  --   --  9.7*  --   HCT 33.6* 32.8*  --   --  29.0*  --   PLT 191 193  --   --  166  --   HEPARINUNFRC 0.47  --  <0.10* 0.15*  --  0.18*  CREATININE  --  1.48*  --   --  1.42*  --     Estimated Creatinine Clearance: 46.6 mL/min (A) (by C-G formula based on SCr of 1.42 mg/dL (H)).   Assessment: 71 yoM on chronic anticoagulation for Afib.  He was admitted with BRBPR on 6/25.  Xarelto was held and he had colonoscopy on 6/28.  No active bleeding found, but incidentally found cecal mass that was suspicious for malignancy (bx results pending).  Surgery was consulted for removal which is tentatively planned for Monday.  Pharmacy is consulted to bridge with heparin in the interim per cardiology recommendations.    Significant events: 7/2 R colectomy for colon mass.  Heparin resumed at 03:00 7/3    Today:  Heparin level remains SUBtherapetic on heparin at 1300 units/hr  No issues with infusion or bleeding per RN  Pre-op patient was therapeutic on 1200 units/hr  CBC: Hgb decreased this am (only 87mL est blood loss per OR report 7/2). pltc WNL  Goal of Therapy:  Heparin level  0.3-0.5 units/ml - but aiming for lower end of range due to risk of bleeding. Monitor platelets by anticoagulation protocol: Yes   Plan:   Increase heparin gtt to 1500 units/hr as patient previously therapeutic at this infusion rate  Increasing more conservatively due to recent surgery  Check 8h heparin level  Daily heparin level and CBC  Monitor for bleeding  Await restart rivaroxaban when appropriate - note based on current renal function, patient should be on rivaroxaban 15mg  daily with meal (was on 20mg  daily at home)  Doreene Eland, PharmD, BCPS.   Pager: [phone] 08/30/2016 9:32 AM

## 2016-08-30 NOTE — Progress Notes (Signed)
PROGRESS NOTE   Bradley Short  TGG:[id number]    DOB: 1932-08-06    DOA: 08/21/2016  PCP: Manon Hilding, MD   I have briefly reviewed patients previous medical records in [place].  Brief Narrative:   84 PAF on Xarelto, HLD,  OSA on CPAP gout,  presented with multiple episodes of painless BRBPR, weakness, diaphoresis, went to [facility] ED where hemoglobin around 12 g, initially A. fib with RVR which improved with IV fluids,   CT abdomen and pelvis showed sigmoid diverticulosis without acute findings,   transferred to [facility] stepdown unit. Hemodynamically and hemoglobin stable.   [facility] consulted and patient underwent colonoscopy 6/28.   Cecal lesion noted-Gen. surgery consulted  Cardiology provided preop clearance and made recommendations regarding A. fib management that patient developed overnight prior to surgery.  Assessment & Plan:   Principal Problem:   Acute GI bleeding Active Problems:   Pure hypercholesterolemia   ATRIAL FIBRILLATION   Acute blood loss anemia   Gout   Leukocytosis   Thrombocytopenia (HCC)   CKD (chronic kidney disease), stage III   Syncope, vasovagal   H/O colectomy  Acute blood loss anemia: Secondary to GI bleed. Stable. Acute GI bleed, suspect LGIB:    Preop CT abdomen without contrast 7/1: Sigmoid diverticulosis without acute diverticulitis. No  acute findings  Xarelto held (last dose 08/20/16).   [facility] was consulted a   As per GI follow-up, bleeding source likely diverticular in origin and has clinically resolved.   Cecal mass s/p R Colectomy 7/2-HIGH GRADE DYSPLASIA AND SMALL FOCUS  SUSPICIOUS FOR ADENOCARCINOMA.  Not amenable to remove endoscopically. 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   gen surgery and Oncology please coordinate if any further adjuvant Rx necessary--?-surgical cure   .  Thrombocytopenia:  Secondary to acute blood loss. Resolved. Leukocytosis: Likely stress response. Resolved.  Syncope:   ? vasovagal due to acute GI bleed. Telemetry without significant arrhythmias as cause. 2-D echo:  LVEF  60-65 percent. Moderate diastolic dysfunction. No significant valvular abnormalities.  Mobilize. PT recommends no follow-up at this time. Paroxysmal atrial fibrillation with RVR:  Not on rate control medications PTA.   Xarelto-->hep Gtt.   Cardiology indicated low to moderate risk for surgery. Developed A. fib with RVR in the 170s  overnight 7/1.  Dr H Discussed with Dr. Johnsie Cancel - adding IV amiodarone given    Cardizem gtt started by cardiology 7/3--now at 5, Amio at 16.7   try Inderal 10 MG 3 times a day if needed for rate control. ?   Resume heparin postop and transition to Xarelto when okay with general surgery  Hyperlipidemia: Resumed medications post colonoscopy. Gout: No acute flare. Continue allopurinol.  Hypophosphatemia/hypomagensiema:  Stage III chronic kidney disease:   Baseline creatinine not known. Creatinine stable in the 1.2-1.3 range.  Replaced.  ICU electrolyte protocol   DVT prophylaxis: SCDs. Now on IV heparin infusion per pharmacy-held prior to surgery. Code Status: Full Family Communication:  Disposition:   Consultants:  [facility]  Gen. Surgery Cardiology  Procedures:  Colonoscopy 08/24/16: Impression:               - Preparation of the colon was fair.                           - One 7 mm polyp in the cecum, removed with  a hot                            snare. Complete resection. Polyp tissue not                            retrieved.                           - Likely malignant tumor in the cecum and at the                            appendiceal orifice. Biopsied. Tattooed.                           - Diverticulosis in the sigmoid colon, in the                            descending colon and at the splenic flexure.                           - Non-bleeding  external hemorrhoids.                           - The examination was otherwise normal.  Underwent R lap assist Hemicolectomy 7/2-Dr. Marlou Starks  Pathology results:  Diagnosis Ileocecal valve, biopsy, mass - SESSILE SERRATED POLYP WITH HIGH GRADE DYSPLASIA AND SMALL FOCUS SUSPICIOUS FOR ADENOCARCINOMA.  Antimicrobials:  None    Subjective:  Slightly anxious today Wondering if he should delay foley witghdrawal Wife at bedside tol a couple of sips Tylenol expired Some flatus No chills No vomit    Objective:  Vitals:   08/30/16 0400 08/30/16 0500 08/30/16 0600 08/30/16 0700  BP: 131/81 129/74 (!) 118/49 (!) 131/52  Pulse: 75 78 77 78  Resp: 16 19 18 16   Temp:      TempSrc:      SpO2: 98% 95% 97% 96%  Weight:      Height:        Examination:  EOMI NCAT Neck soft and supple s1 s 2no m/r/g--irreg irreg abd shows patch.  Less dietended compared to prior, no rebound Neuro intact   Data Reviewed: I have personally reviewed following labs and imaging studies  CBC:  Recent Labs Lab 08/27/16 0518 08/27/16 2203 08/28/16 0527 08/29/16 0323 08/30/16 0256  WBC 8.9 9.5 10.7* 24.9* 19.1*  HGB 10.2* 11.0* 11.2* 10.7* 9.7*  HCT 30.2* 32.5* 33.6* 32.8* 29.0*  MCV 88.3 87.6 89.8 89.4 90.1  PLT 161 188 191 193 941   Basic Metabolic Panel:  Recent Labs Lab 08/23/16 0916 08/24/16 0700 08/25/16 0324 08/26/16 0806 08/29/16 0323 08/30/16 0256  NA 140 143 140 140 138 137  K 3.7 3.7 3.5 3.5 4.0 3.8  CL 109 112* 111 109 107 108  CO2 22 24 23 24 25 23   GLUCOSE 172* 118* 120* 108* 176* 158*  BUN 17 11 7 10 13 15   CREATININE 1.48* 1.36* 1.27* 1.33* 1.48* 1.42*  CALCIUM 8.4* 8.3* 8.2* 8.5* 8.2* 8.1*  MG 2.1  --   --   --   --   --   PHOS 1.9* 1.9*  1.9* 2.9  --   --   --  Liver Function Tests:  Recent Labs Lab 08/23/16 0916 08/24/16 0700  AST 31  --   ALT 18  --   ALKPHOS 56  --   BILITOT 0.5  --   PROT 6.3*  --   ALBUMIN 3.5 3.2*   Coagulation  Profile: No results for input(s): INR, PROTIME in the last 168 hours. CBG:  Recent Labs Lab 08/24/16 1659 08/24/16 2319 08/25/16 0641 08/25/16 0736 08/25/16 1521  GLUCAP 122* 108* 122* 116* 123*    Radiology Studies: No results found.   Scheduled Meds: . allopurinol  100 mg Oral Daily  . feeding supplement (ENSURE ENLIVE)  237 mL Oral BID BM   Continuous Infusions: . acetaminophen    . amiodarone 30 mg/hr (08/30/16 0905)  . dextrose 5 % and 0.45 % NaCl with KCl 20 mEq/L 100 mL/hr at 08/30/16 0903  . diltiazem (CARDIZEM) infusion 5 mg/hr (08/30/16 0730)  . heparin 1,500 Units/hr (08/30/16 0949)     LOS: 9 days    Verneita Griffes, MD [facility] [phone]   08/30/2016, 7:25 AM

## 2016-08-30 NOTE — Progress Notes (Signed)
Physical Therapy Treatment Patient Details Name: Bradley Short MRN: [id number] DOB: [birth year] Today's Date: 08/30/2016    History of Present Illness 83 year old male who was admitted to the hospital with acute lower GI bleeding.  Pt with hx of chronic atrial fibrillation and is on chronic anticoagulation. Pt underwent a colonoscopy which demonstrated diverticular disease (this is felt to be the source of his bleeding) (and a incidentally found neoplasm in the cecum at the appendiceal orifice that was suspicious for malignancy and pt with plans for lap assisted R colectomy Monday     PT Comments    Pt cooperative but with decreased activity tolerance this date.  Pt c/o "weakness, I haven't eaten for a week".  Session ltd by elevating HR.  RN aware.   Follow Up Recommendations  No PT follow up     Equipment Recommendations  None recommended by PT    Recommendations for Other Services       Precautions / Restrictions Precautions Precautions: Fall Restrictions Weight Bearing Restrictions: No    Mobility  Bed Mobility Overal bed mobility: Needs Assistance Bed Mobility: Supine to Sit     Supine to sit: Min assist;HOB elevated     General bed mobility comments: Increased time with min assist to bring trunk to upright and to complete transition to EOB  Transfers Overall transfer level: Needs assistance Equipment used: Rolling walker (2 wheeled) Transfers: Sit to/from Stand Sit to Stand: Min assist;+2 physical assistance;+2 safety/equipment;From elevated surface         General transfer comment: verbal cues for hand placement  Ambulation/Gait Ambulation/Gait assistance: Min assist;+2 physical assistance;+2 safety/equipment Ambulation Distance (Feet): 125 Feet Assistive device: Rolling walker (2 wheeled) Gait Pattern/deviations: Step-through pattern;Decreased step length - right;Decreased step length - left;Shuffle;Trunk flexed Gait velocity: decr Gait velocity  interpretation: Below normal speed for age/gender General Gait Details: cues for posture and position from RW.  Increased time and multiple rests required.  Distance ltd by elevating HR   Stairs            Wheelchair Mobility    Modified Rankin (Stroke Patients Only)       Balance Overall balance assessment: Needs assistance Sitting-balance support: Single extremity supported;Feet supported Sitting balance-Leahy Scale: Fair     Standing balance support: Bilateral upper extremity supported Standing balance-Leahy Scale: Poor                              Cognition Arousal/Alertness: Awake/alert Behavior During Therapy: WFL for tasks assessed/performed;Anxious Overall Cognitive Status: Within Functional Limits for tasks assessed                                        Exercises      General Comments        Pertinent Vitals/Pain Pain Assessment: No/denies pain    Home Living                      Prior Function            PT Goals (current goals can now be found in the care plan section) Acute Rehab PT Goals Patient Stated Goal: Walk PT Goal Formulation: With patient Time For Goal Achievement: 09/01/16 Potential to Achieve Goals: Good Progress towards PT goals: Not progressing toward goals - comment (Increased fatigue/weakness)    Frequency  Min 3X/week      PT Plan Current plan remains appropriate    Co-evaluation              AM-PAC PT "6 Clicks" Daily Activity  Outcome Measure  Difficulty turning over in bed (including adjusting bedclothes, sheets and blankets)?: A Lot Difficulty moving from lying on back to sitting on the side of the bed? : Total Difficulty sitting down on and standing up from a chair with arms (e.g., wheelchair, bedside commode, etc,.)?: Total Help needed moving to and from a bed to chair (including a wheelchair)?: A Lot Help needed walking in hospital room?: A Little Help needed  climbing 3-5 steps with a railing? : A Lot 6 Click Score: 11    End of Session Equipment Utilized During Treatment: Gait belt Activity Tolerance: Patient tolerated treatment well;Patient limited by fatigue Patient left: in chair;with call bell/phone within reach;with chair alarm set Nurse Communication: Mobility status PT Visit Diagnosis: Difficulty in walking, not elsewhere classified (R26.2)     Time: 5501-5868 PT Time Calculation (min) (ACUTE ONLY): 25 min  Charges:  $Gait Training: 23-37 mins                    G Codes:       Pg [phone]    Yanci Bachtell 08/30/2016, 12:37 PM

## 2016-08-31 LAB — CBC
HEMATOCRIT: 29.4 % — AB (ref 39.0–52.0)
Hemoglobin: 9.9 g/dL — ABNORMAL LOW (ref 13.0–17.0)
MCH: 30.2 pg (ref 26.0–34.0)
MCHC: 33.7 g/dL (ref 30.0–36.0)
MCV: 89.6 fL (ref 78.0–100.0)
PLATELETS: 189 10*3/uL (ref 150–400)
RBC: 3.28 MIL/uL — ABNORMAL LOW (ref 4.22–5.81)
RDW: 14.9 % (ref 11.5–15.5)
WBC: 18.7 10*3/uL — ABNORMAL HIGH (ref 4.0–10.5)

## 2016-08-31 LAB — C DIFFICILE QUICK SCREEN W PCR REFLEX
C DIFFICILE (CDIFF) INTERP: NOT DETECTED
C Diff antigen: NEGATIVE
C Diff toxin: NEGATIVE

## 2016-08-31 LAB — BASIC METABOLIC PANEL
ANION GAP: 7 (ref 5–15)
BUN: 14 mg/dL (ref 6–20)
CALCIUM: 8.3 mg/dL — AB (ref 8.9–10.3)
CO2: 24 mmol/L (ref 22–32)
CREATININE: 1.41 mg/dL — AB (ref 0.61–1.24)
Chloride: 107 mmol/L (ref 101–111)
GFR calc Af Amer: 52 mL/min — ABNORMAL LOW (ref >60)
GFR calc non Af Amer: 45 mL/min — ABNORMAL LOW (ref >60)
Glucose, Bld: 141 mg/dL — ABNORMAL HIGH (ref 65–99)
Potassium: 3.6 mmol/L (ref 3.5–5.1)
Sodium: 138 mmol/L (ref 135–145)

## 2016-08-31 LAB — HEPARIN LEVEL (UNFRACTIONATED): Heparin Unfractionated: 0.36 IU/mL (ref 0.30–0.70)

## 2016-08-31 MED ORDER — LORAZEPAM 2 MG/ML IJ SOLN
0.5000 mg | Freq: Four times a day (QID) | INTRAMUSCULAR | Status: DC
  Administered 2016-08-31 – 2016-09-01 (×4): 0.5 mg via INTRAVENOUS
  Filled 2016-08-31 (×4): qty 1

## 2016-08-31 MED ORDER — RIVAROXABAN 15 MG PO TABS
15.0000 mg | ORAL_TABLET | Freq: Every day | ORAL | Status: DC
  Administered 2016-08-31: 15 mg via ORAL
  Filled 2016-08-31 (×2): qty 1

## 2016-08-31 MED ORDER — BOOST / RESOURCE BREEZE PO LIQD
1.0000 | Freq: Three times a day (TID) | ORAL | Status: DC
  Administered 2016-08-31 (×2): 1 via ORAL

## 2016-08-31 MED ORDER — BUSPIRONE HCL 5 MG PO TABS
5.0000 mg | ORAL_TABLET | Freq: Two times a day (BID) | ORAL | Status: DC
  Administered 2016-08-31 – 2016-09-01 (×2): 5 mg via ORAL
  Filled 2016-08-31 (×2): qty 1

## 2016-08-31 MED ORDER — METOCLOPRAMIDE HCL 5 MG/ML IJ SOLN
5.0000 mg | Freq: Three times a day (TID) | INTRAMUSCULAR | Status: AC
  Administered 2016-08-31 – 2016-09-02 (×5): 5 mg via INTRAVENOUS
  Filled 2016-08-31 (×5): qty 2

## 2016-08-31 NOTE — Progress Notes (Signed)
Progress Note  Patient Name: Bradley Short Date of Encounter: 08/31/2016  Primary Cardiologist: Hochrein, allred   Subjective   No chest pain or dyspnea. Had a BM early this am.   Inpatient Medications    Scheduled Meds: . allopurinol  100 mg Oral Daily  . feeding supplement (ENSURE ENLIVE)  237 mL Oral BID BM   Continuous Infusions: . acetaminophen Stopped (08/31/16 0551)  . amiodarone 30 mg/hr (08/31/16 0600)  . dextrose 5 % and 0.45 % NaCl with KCl 20 mEq/L 100 mL/hr at 08/31/16 0600  . diltiazem (CARDIZEM) infusion 5 mg/hr (08/31/16 0600)  . heparin 1,600 Units/hr (08/31/16 0600)   PRN Meds: fentaNYL (SUBLIMAZE) injection, LORazepam, morphine injection, ondansetron **OR** ondansetron (ZOFRAN) IV   Vital Signs    Vitals:   08/31/16 0316 08/31/16 0400 08/31/16 0500 08/31/16 0650  BP:   (!) 107/49 (!) 96/37  Pulse:  (!) 102 92 85  Resp:  18 (!) 22 (!) 26  Temp: (!) 97.5 F (36.4 C)     TempSrc: Oral     SpO2:  96% 94% 94%  Weight:      Height:        Intake/Output Summary (Last 24 hours) at 08/31/16 0732 Last data filed at 08/31/16 0600  Gross per 24 hour  Intake          3758.38 ml  Output             1650 ml  Net          2108.38 ml   Filed Weights   08/25/16 1640 08/26/16 0430 08/28/16 1700  Weight: 205 lb 11 oz (93.3 kg) 204 lb 3.2 oz (92.6 kg) 204 lb 2.3 oz (92.6 kg)    Telemetry    Afib rate controlled in the 80's  - Personally Reviewed  ECG    No new tracings  Physical Exam   Affect appropriate Healthy:  appears stated age HEENT: normal Neck supple with no adenopathy JVP normal no bruits no thyromegaly Lungs clear with no wheezing and good diaphragmatic motion Heart:  S1/S2 no murmur, no rub, gallop or click PMI normal Abdomen: post colectomy, soft, few bowel sounds, no bruit.   Distal pulses intact  Trace ankle edema Neuro non-focal Skin warm and dry No muscular weakness   Labs    Chemistry  Recent Labs Lab  08/26/16 0806 08/29/16 0323 08/30/16 0256  NA 140 138 137  K 3.5 4.0 3.8  CL 109 107 108  CO2 24 25 23   GLUCOSE 108* 176* 158*  BUN 10 13 15   CREATININE 1.33* 1.48* 1.42*  CALCIUM 8.5* 8.2* 8.1*  GFRNONAA 48* 42* 44*  GFRAA 56* 49* 52*  ANIONGAP 7 6 6      Hematology  Recent Labs Lab 08/29/16 0323 08/30/16 0256 08/31/16 0300  WBC 24.9* 19.1* 18.7*  RBC 3.67* 3.22* 3.28*  HGB 10.7* 9.7* 9.9*  HCT 32.8* 29.0* 29.4*  MCV 89.4 90.1 89.6  MCH 29.2 30.1 30.2  MCHC 32.6 33.4 33.7  RDW 14.5 14.9 14.9  PLT 193 166 189    Cardiac EnzymesNo results for input(s): TROPONINI in the last 168 hours. No results for input(s): TROPIPOC in the last 168 hours.   BNPNo results for input(s): BNP, PROBNP in the last 168 hours.   DDimer No results for input(s): DDIMER in the last 168 hours.   Radiology    No results found.  Cardiac Studies   Echo 08/23/16  Study Conclusions - Procedure narrative:  Transthoracic echocardiography. Image   quality was poor. The study was technically difficult, as a   result of poor sound wave transmission. - Left ventricle: The cavity size was normal. Wall thickness was   increased in a pattern of mild LVH. Systolic function was normal.   The estimated ejection fraction was in the range of 60% to 65%.   Although no diagnostic regional wall motion abnormality was   identified, this possibility cannot be completely excluded on the   basis of this study. Features are consistent with a pseudonormal   left ventricular filling pattern, with concomitant abnormal   relaxation and increased filling pressure (grade 2 diastolic   dysfunction). - Aortic valve: There was no stenosis. - Mitral valve: Mildly calcified annulus. There was trivial   regurgitation. - Left atrium: The atrium was mildly to moderately dilated. - Right ventricle: The cavity size was normal. Systolic function   was normal. - Pulmonary arteries: No complete TR doppler jet so unable to    estimate PA systolic pressure. - Inferior vena cava: The vessel was normal in size. The   respirophasic diameter changes were in the normal range (>= 50%),   consistent with normal central venous pressure.  Impressions: - Normal LV size with mild LV hypertrophy. EF 60-65%. Moderate   diastolic dysfunction. Normal RV size and systolic function.   Moderate LAE. No significant valvular abnormalities.   Patient Profile     83 y.o. male with PAF CHAD2VASC 3 on xarelto at home Hisotry of afib/flutter ablation 2012 Normal EF 60-65% no history of CAD Unclear reactions to a combination of meds at [facility] Years ago and listed as intolerence to cardizem beta blocker and propafenone. "Looked like he was burnt"  For OR colectomy 08/28/16   Assessment & Plan    1. PAF:  Pt with previous multiple allergic reactions that was unclear as to which medications caused. Is now rate controlled on cardizem and amiodarone. BP is soft but stable. Pt is on IV heparin until taking orals. Has had BM and surgery is going to initiate clear liquid diet. Will switch cardizem and amiodarone to oral dosing once pt tolerating oral diet.    Signed, Daune Perch, NP  08/31/2016, 7:32 AM    Doing well exam with healing abdominal incision clear lungs no murmur Will switch to PO cardizem/inderal  and amiodarone in am  Start xarelto back and stop heparin when ok with surgery   Jenkins Rouge

## 2016-08-31 NOTE — Progress Notes (Signed)
PROGRESS NOTE   Bradley Short  YQM:[id number]    DOB: Sep 21, 1932    DOA: 08/21/2016  PCP: Manon Hilding, MD   I have briefly reviewed patients previous medical records in [place].  Brief Narrative:   84 PAF on Xarelto, HLD,  OSA on CPAP gout,  presented with multiple episodes of painless BRBPR, weakness, diaphoresis, went to [facility] ED where hemoglobin around 12 g, initially A. fib with RVR which improved with IV fluids,   CT abdomen and pelvis showed sigmoid diverticulosis without acute findings,   transferred to [facility] stepdown unit. Hemodynamically and hemoglobin stable.   [facility] consulted and patient underwent colonoscopy 6/28.   Cecal lesion noted-Gen. surgery consulted  Cardiology provided preop clearance and made recommendations regarding A. fib management that patient developed overnight prior to surgery.  Assessment & Plan:   Principal Problem:   Acute GI bleeding Active Problems:   Pure hypercholesterolemia   ATRIAL FIBRILLATION   Acute blood loss anemia   Gout   Leukocytosis   Thrombocytopenia (HCC)   CKD (chronic kidney disease), stage III   Syncope, vasovagal   H/O colectomy  Acute blood loss anemia: Secondary to GI bleed. Stable. Acute GI bleed, suspect LGIB:    Preop CT abdomen without contrast 7/1: Sigmoid diverticulosis without acute diverticulitis. No  acute findings  Xarelto now being resumed 7/4  As deemed safe per Gen surg  [facility] follow-up, bleeding source likely diverticular in origin and has clinically resolved.   Cecal mass s/p R Colectomy 7/2-HIGH GRADE DYSPLASIA AND SMALL FOCUS  SUSPICIOUS FOR ADENOCARCINOMA.  Not amenable to remove endoscopically. 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   gen surgery and Oncology please coordinate if any further adjuvant Rx necessary--?-surgical cure   Bipolar   On Clonopin ~ 3  yrs prior  Situational stressors of surgery and cancer  Increased ativan to 0.5 q6 prn-->changed to scheudled  Starting Buspar 5 bid  .  Thrombocytopenia: Secondary to acute blood loss. Resolved. Leukocytosis: Likely stress response. Resolved.  Syncope:   ? vasovagal due to acute GI bleed. Telemetry without significant arrhythmias as cause. 2-D echo:  LVEF  60-65 percent. Moderate diastolic dysfunction. No significant valvular abnormalities.  Mobilize. PT recommends no follow-up at this time. Paroxysmal atrial fibrillation with RVR-now back on Xarelto 7/4  Not on rate control medications PTA.   Cardiology indicated low to moderate risk for surgery. Developed A. fib with RVR in the 170s  overnight 7/1.  Dr H Discussed with Dr. Johnsie Cancel - adding IV amiodarone given    Cardizem gtt started by cardiology 7/3--now at 5, Amio at 16.7   try Inderal 10 MG 3 times a day if needed for rate control. ?   Defer to cardiology --?probably can use Cardizem 180 tid and amio po 200 bid with quikc taper to 200 od in 1 week   Hyperlipidemia: Resumed medications post colonoscopy. Gout: No acute flare. Continue allopurinol.  Hypophosphatemia/hypomagensiema:  Stage III chronic kidney disease:   Baseline creatinine not known. Creatinine stable in the 1.2-1.3 range.  Replaced.  ICU electrolyte protocol   DVT prophylaxis: SCDs. Code Status: Full Family Communication:  Disposition:   Consultants:  [facility]  Gen. Surgery Cardiology  Procedures:  Colonoscopy 08/24/16: Impression:               - Preparation of the colon was fair.                           -  One 7 mm polyp in the cecum, removed with a hot                            snare. Complete resection. Polyp tissue not                            retrieved.                           - Likely malignant tumor in the cecum and at the                            appendiceal orifice. Biopsied. Tattooed.                           - Diverticulosis in the  sigmoid colon, in the                            descending colon and at the splenic flexure.                           - Non-bleeding external hemorrhoids.                           - The examination was otherwise normal.  Underwent R lap assist Hemicolectomy 7/2-Dr. Marlou Starks  Pathology results:  Diagnosis Ileocecal valve, biopsy, mass - SESSILE SERRATED POLYP WITH HIGH GRADE DYSPLASIA AND SMALL FOCUS SUSPICIOUS FOR ADENOCARCINOMA.  Antimicrobials:  None    Subjective:   1 peanut butter like Stool! Poor sleep Some lbp Mild cough , no sputum Throat dry Wife bedside---both anxious    Objective:  Vitals:   08/31/16 0316 08/31/16 0400 08/31/16 0500 08/31/16 0650  BP:   (!) 107/49 (!) 96/37  Pulse:  (!) 102 92 85  Resp:  18 (!) 22 (!) 26  Temp: (!) 97.5 F (36.4 C)     TempSrc: Oral     SpO2:  96% 94% 94%  Weight:      Height:        Examination:  EOMI NCAT Neck soft and supple cta b s1 s 2no m/r/g--irreg irreg abd shows patch.  No rebound, area clear, no le edema Neuro intact    Data Reviewed: I have personally reviewed following labs and imaging studies  CBC:  Recent Labs Lab 08/27/16 2203 08/28/16 0527 08/29/16 0323 08/30/16 0256 08/31/16 0300  WBC 9.5 10.7* 24.9* 19.1* 18.7*  HGB 11.0* 11.2* 10.7* 9.7* 9.9*  HCT 32.5* 33.6* 32.8* 29.0* 29.4*  MCV 87.6 89.8 89.4 90.1 89.6  PLT 188 191 193 166 361   Basic Metabolic Panel:  Recent Labs Lab 08/25/16 0324 08/26/16 0806 08/29/16 0323 08/30/16 0256 08/30/16 0300  NA 140 140 138 137  --   K 3.5 3.5 4.0 3.8  --   CL 111 109 107 108  --   CO2 23 24 25 23   --   GLUCOSE 120* 108* 176* 158*  --   BUN 7 10 13 15   --   CREATININE 1.27* 1.33* 1.48* 1.42*  --   CALCIUM 8.2* 8.5* 8.2* 8.1*  --   MG  --   --   --   --  2.1  PHOS 2.9  --   --   --  2.2*   Liver Function Tests: No results for input(s): AST, ALT, ALKPHOS, BILITOT, PROT, ALBUMIN in the last 168 hours. Coagulation Profile: No results  for input(s): INR, PROTIME in the last 168 hours. CBG:  Recent Labs Lab 08/24/16 1659 08/24/16 2319 08/25/16 0641 08/25/16 0736 08/25/16 1521  GLUCAP 122* 108* 122* 116* 123*    Radiology Studies: No results found.   Scheduled Meds: . allopurinol  100 mg Oral Daily  . feeding supplement (ENSURE ENLIVE)  237 mL Oral BID BM   Continuous Infusions: . acetaminophen Stopped (08/31/16 0551)  . amiodarone 30 mg/hr (08/31/16 0600)  . dextrose 5 % and 0.45 % NaCl with KCl 20 mEq/L 100 mL/hr at 08/31/16 0600  . diltiazem (CARDIZEM) infusion 5 mg/hr (08/31/16 0600)  . heparin 1,600 Units/hr (08/31/16 0600)     LOS: 10 days    Verneita Griffes, MD [facility] [phone]   08/31/2016, 7:50 AM

## 2016-08-31 NOTE — Progress Notes (Signed)
Patient ID: [id number] Bradley Short, male   DOB: 1932/12/26, 83 y.o.   MRN: [id number]  [facility] Progress Note  3 Days Post-Op  Subjective: CC- weak Wife at bedside. No major complaints this morning. Denies abdominal pain, n/v. Had a moderate BM and is passing flatus. Ambulated in hall with PT yesterday.  Objective: Vital signs in last 24 hours: Temp:  [97.5 F (36.4 C)-98.9 F (37.2 C)] 98 F (36.7 C) (07/05 0800) Pulse Rate:  [64-138] 76 (07/05 0800) Resp:  [15-29] 24 (07/05 0800) BP: (94-148)/(34-79) 94/47 (07/05 0800) SpO2:  [89 %-98 %] 95 % (07/05 0800) Last BM Date: 08/25/16  Intake/Output from previous day: 07/04 0701 - 07/05 0700 In: 3758.4 [P.O.:120; I.V.:3338.4; IV Piggyback:300] Out: 1650 [Urine:1650] Intake/Output this shift: Total I/O In: 275.4 [I.V.:275.4] Out: -   PE: Gen:  Alert, NAD, pleasant HEENT: EOM's intact, pupils equal  Card:  Irregularly irregular Pulm:  CTAB, no W/R/R, effort normal Abd: Soft, mild distension, +BS, midline incision C/D/I with staples intact and honeycomb dressing in place Psych: A&Ox3  Skin: no rashes noted, warm and dry  Lab Results:   Recent Labs  08/30/16 0256 08/31/16 0300  WBC 19.1* 18.7*  HGB 9.7* 9.9*  HCT 29.0* 29.4*  PLT 166 189   BMET  Recent Labs  08/29/16 0323 08/30/16 0256  NA 138 137  K 4.0 3.8  CL 107 108  CO2 25 23  GLUCOSE 176* 158*  BUN 13 15  CREATININE 1.48* 1.42*  CALCIUM 8.2* 8.1*   PT/INR No results for input(s): LABPROT, INR in the last 72 hours. CMP     Component Value Date/Time   NA 137 08/30/2016 0256   K 3.8 08/30/2016 0256   CL 108 08/30/2016 0256   CO2 23 08/30/2016 0256   GLUCOSE 158 (H) 08/30/2016 0256   BUN 15 08/30/2016 0256   CREATININE 1.42 (H) 08/30/2016 0256   CALCIUM 8.1 (L) 08/30/2016 0256   PROT 6.3 (L) 08/23/2016 0916   ALBUMIN 3.2 (L) 08/24/2016 0700   AST 31 08/23/2016 0916   ALT 18 08/23/2016 0916   ALKPHOS 56 08/23/2016 0916   BILITOT 0.5  08/23/2016 0916   GFRNONAA 44 (L) 08/30/2016 0256   GFRAA 52 (L) 08/30/2016 0256   Lipase  No results found for: LIPASE     Studies/Results: No results found.  Anti-infectives: Anti-infectives    Start     Dose/Rate Route Frequency Ordered Stop   08/28/16 1133  cefoTEtan in Dextrose 5% (CEFOTAN) 2-2.08 GM-% IVPB    Comments:  [name], Bella Kennedy   : cabinet override      08/28/16 1133 08/28/16 1145   08/28/16 0945  cefoTEtan (CEFOTAN) 2 g in dextrose 5 % 50 mL IVPB     2 g 100 mL/hr over 30 Minutes Intravenous On call to O.R. 08/28/16 0941 08/28/16 1710       Assessment/Plan Acute lower GIB - etiology diverticular disease vs malignancy  S/P COLONOSCOPY 6/28: Cecal/appendiceal orifice mass - BX shows high grade dysplasia with small focus conerning for adenocarcinoma  S/P LAPAROSCOPIC ASSISTED RIGHT COLECTOMY 08/28/16 Dr. Autumn Messing III -  POD 3 -  follow surgical pathology  -  bowel function returning -  Advance to clear liquid diet. [place] for oral meds. Continue to mobilize/PT and IS.  -  Ok to transfer to SDU from surgical standpoint.  ABL anemia - hgb 9.9, stable Paroxysmal atrial fibrillation - Xarelto held, on heparin gtt.  On amiodarone gtt.  Cards following.  HLD CKD, stage III   Gout - allopurinol  FEN - IVF, clear liquid diet ID -  perioperative cefotetan 7/2; WBC trending down 18.7/afebrile VTE - SCD's, heparin gtt   LOS: 10 days    Jerrye Beavers , [facility] 08/31/2016, 8:34 AM Pager: [phone] Consults: [phone] Mon-Fri 7:00 am-4:30 pm Sat-Sun 7:00 am-11:30 am

## 2016-08-31 NOTE — Progress Notes (Signed)
ANTICOAGULATION CONSULT NOTE - Follow Up Consult  Pharmacy Consult for Heparin Indication: atrial fibrillation, bridge while off Xarelto  Allergies  Allergen Reactions  . Diltiazem Hcl     REACTION: Looked like burned all over  . Lisinopril     REACTION: Looked like burned all over  . Metoprolol Succinate     REACTION: Looked like burned all over  . Propafenone Hcl     REACTION: Looked like burned all over    Patient Measurements: Height: 5\' 11"  (180.3 cm) Weight: 204 lb 2.3 oz (92.6 kg) IBW/kg (Calculated) : 75.3 Heparin Dosing Weight:   Vital Signs: Temp: 97.5 F (36.4 C) (07/05 0316) Temp Source: Oral (07/05 0316) BP: 113/61 (07/05 0300) Pulse Rate: 102 (07/05 0400)  Labs:  Recent Labs  08/29/16 0323  08/30/16 0256 08/30/16 0804 08/30/16 1810 08/31/16 0300  HGB 10.7*  --  9.7*  --   --  9.9*  HCT 32.8*  --  29.0*  --   --  29.4*  PLT 193  --  166  --   --  189  HEPARINUNFRC  --   < >  --  0.18* 0.27* 0.36  CREATININE 1.48*  --  1.42*  --   --   --   < > = values in this interval not displayed.  Estimated Creatinine Clearance: 46.6 mL/min (A) (by C-G formula based on SCr of 1.42 mg/dL (H)).   Medications:  Infusions:  . acetaminophen Stopped (08/30/16 2229)  . amiodarone 30 mg/hr (08/31/16 0400)  . dextrose 5 % and 0.45 % NaCl with KCl 20 mEq/L 100 mL/hr at 08/31/16 0400  . diltiazem (CARDIZEM) infusion 5 mg/hr (08/31/16 0400)  . heparin 1,600 Units/hr (08/31/16 0400)    Assessment: Patient with heparin level at goal. Level drawn a little early.  No heparin issues per RN.  Goal of Therapy:  Heparin level 0.3-0.5 units/ml - but aiming for lower end of range due to risk of bleeding. Monitor platelets by anticoagulation protocol: Yes   Plan:  Continue heparin drip at current rate Recheck level at [place], Dryden Crowford 08/31/2016,4:51 AM

## 2016-08-31 NOTE — Progress Notes (Addendum)
ANTICOAGULATION CONSULT NOTE - Follow Up Consult  Pharmacy Consult for Xarelto Indication: atrial fibrillation  Allergies  Allergen Reactions  . Diltiazem Hcl     REACTION: Looked like burned all over  . Lisinopril     REACTION: Looked like burned all over  . Metoprolol Succinate     REACTION: Looked like burned all over  . Propafenone Hcl     REACTION: Looked like burned all over    Patient Measurements: Height: 5\' 11"  (180.3 cm) Weight: 204 lb 2.3 oz (92.6 kg) IBW/kg (Calculated) : 75.3  Vital Signs: Temp: 97.5 F (36.4 C) (07/05 0316) Temp Source: Oral (07/05 0316) BP: 96/37 (07/05 0650) Pulse Rate: 85 (07/05 0650)  Labs:  Recent Labs  08/29/16 0323  08/30/16 0256 08/30/16 0804 08/30/16 1810 08/31/16 0300  HGB 10.7*  --  9.7*  --   --  9.9*  HCT 32.8*  --  29.0*  --   --  29.4*  PLT 193  --  166  --   --  189  HEPARINUNFRC  --   < >  --  0.18* 0.27* 0.36  CREATININE 1.48*  --  1.42*  --   --   --   < > = values in this interval not displayed.  Estimated Creatinine Clearance: 46.6 mL/min (A) (by C-G formula based on SCr of 1.42 mg/dL (H)).   Medications:  Scheduled:  . allopurinol  100 mg Oral Daily  . busPIRone  5 mg Oral BID  . feeding supplement (ENSURE ENLIVE)  237 mL Oral BID BM  . LORazepam  0.5 mg Intravenous Q6H   Infusions:  . acetaminophen Stopped (08/31/16 0551)  . amiodarone 30 mg/hr (08/31/16 0800)  . dextrose 5 % and 0.45 % NaCl with KCl 20 mEq/L 100 mL/hr at 08/31/16 0800  . diltiazem (CARDIZEM) infusion 5 mg/hr (08/31/16 0800)    Assessment: 89 yoM admitted on 6/25 with rectal bleeding and PMH significant for afib on chronic Xarelto anticoagulation.  Xarelto was held for colonoscopy on 6/28 and Heparin IV was started on 6/29.  No active bleeding found, but incidentally found cecal mass that was suspicious for malignancy.  Surgery completed colectomy on 7/2 and Heparin was resumed on 7/3.  Pharmacy is now consulted to resume  Xarelto.  PTA Xarelto 20mg  daily, last dose on 6/24 Heparin IV 6/29-7/5  Today, 08/31/2016: Most recent heparin level is therapeutic at 0.36 CBC: Hgb 9.9 remains stable, Plt WNL SCr ~ 1.42 with CrCl ~ 52 ml/min using TBW   Goal of Therapy:  Monitor platelets by anticoagulation protocol: Yes   Plan:   Stop Heparin drip  Start Xarelto 15 mg PO daily.  Use reduced dose with borderline CrCl and drug-drug interactions  Drug-drug interaction with Diltiazem and Amiodarone (Diltiazem moderately inhibits CYP3A4, Amiodarone inhibits PGP).  The significance of this interaction is expected to be greater in patients with renal impairment.  Recheck BMET with AM labs per MD order.  Monitor for signs and symptoms of bleeding.   Gretta Arab PharmD, BCPS Pager [phone] 08/31/2016,8:27 AM

## 2016-08-31 NOTE — Progress Notes (Signed)
Initial Nutrition Assessment  INTERVENTION:   -Provide Boost Breeze po TID, each supplement provides 250 kcal and 9 grams of protein -Left order for Ensure supplements once diet is advanced. -Encourage PO intake  RD to continue to monitor  NUTRITION DIAGNOSIS:   Inadequate oral intake related to poor appetite as evidenced by per patient/family report.  GOAL:   Patient will meet greater than or equal to 90% of their needs  MONITOR:   PO intake, Supplement acceptance, Labs, Weight trends, I & O's  REASON FOR ASSESSMENT:   LOS, NPO/Clear Liquid Diet    ASSESSMENT:   83 year old male with PMH of PAF on Xarelto, HLD, OSA on CPAP, gout, presented with multiple episodes of painless BRBPR, weakness, diaphoresis, went to [facility] ED where hemoglobin around 12 g, initially A. fib with RVR which improved with IV fluids, CT abdomen and pelvis showed sigmoid diverticulosis without acute findings, transferred to [facility] stepdown unit. Hemodynamically and hemoglobin stable. [facility] consulted and patient underwent colonoscopy 6/28. Cecal lesion noted-Gen. surgery consulted and plan possible surgery 7/2. Cardiology provided preop clearance and made recommendations regarding A. fib management that patient developed overnight prior to surgery. 7/2: s/p LAPAROSCOPIC ASSISTED RIGHT COLECTOMY  Patient in room with no family at bedside. Pt reports feeling weak. He is slowly sipping on clears (mainly juice and tea). States he really has not taste for broth. States he was told he would be moving to another unit today and he would try and eat more at that time.  Pt was ordered ensure supplements but has been on a clear liquid diets since his surgery on 7/2. RD ordered Boost Breeze while on clear liquids.  Pt states prior to his surgery, he had a good appetite.   Per chart review, weight is stable. Nutrition focused physical exam shows no sign of depletion of muscle mass or  body fat.  Medications: IV Reglan every 8 hours, D5 and .45% NaCl w/ KCl infusion at 50 ml/hr -provides 204 kcal Labs reviewed: Low Phos Mg WNL GFR: 45  Diet Order:  Diet clear liquid Room service appropriate? Yes; Fluid consistency: Thin  Skin:   (7/2 abdominal incision)  Last BM:  6/29  Height:   Ht Readings from Last 1 Encounters:  08/28/16 5\' 11"  (1.803 m)    Weight:   Wt Readings from Last 1 Encounters:  08/28/16 204 lb 2.3 oz (92.6 kg)    Ideal Body Weight:  78.2 kg  BMI:  Body mass index is 28.47 kg/m.  Estimated Nutritional Needs:   Kcal:  2100-2300  Protein:  110-120g  Fluid:  2.1L/day  EDUCATION NEEDS:   No education needs identified at this time  Clayton Bibles, MS, RD, LDN Pager: [phone] After Hours Pager: [phone]

## 2016-08-31 NOTE — Progress Notes (Signed)
PT Cancellation Note  Patient Details Name: Bradley Short MRN: [id number] DOB: 1932/04/24   Cancelled Treatment:     PT attempted this am but deferred at request of pt.  Pt states very fatigued after difficult night.  RN aware.  Will follow.   Riva Sesma 08/31/2016, 12:20 PM

## 2016-09-01 ENCOUNTER — Inpatient Hospital Stay (HOSPITAL_COMMUNITY): Payer: Medicare Other

## 2016-09-01 LAB — CBC WITH DIFFERENTIAL/PLATELET
BASOS PCT: 0 %
Basophils Absolute: 0 10*3/uL (ref 0.0–0.1)
EOS ABS: 0.2 10*3/uL (ref 0.0–0.7)
Eosinophils Relative: 2 %
HCT: 30.2 % — ABNORMAL LOW (ref 39.0–52.0)
Hemoglobin: 10.2 g/dL — ABNORMAL LOW (ref 13.0–17.0)
LYMPHS ABS: 1.1 10*3/uL (ref 0.7–4.0)
Lymphocytes Relative: 13 %
MCH: 30 pg (ref 26.0–34.0)
MCHC: 33.8 g/dL (ref 30.0–36.0)
MCV: 88.8 fL (ref 78.0–100.0)
MONO ABS: 1 10*3/uL (ref 0.1–1.0)
MONOS PCT: 12 %
Neutro Abs: 6 10*3/uL (ref 1.7–7.7)
Neutrophils Relative %: 73 %
Platelets: 240 10*3/uL (ref 150–400)
RBC: 3.4 MIL/uL — ABNORMAL LOW (ref 4.22–5.81)
RDW: 15.3 % (ref 11.5–15.5)
WBC: 8.3 10*3/uL (ref 4.0–10.5)

## 2016-09-01 LAB — COMPREHENSIVE METABOLIC PANEL
ALBUMIN: 2.7 g/dL — AB (ref 3.5–5.0)
ALK PHOS: 58 U/L (ref 38–126)
ALT: 19 U/L (ref 17–63)
ANION GAP: 7 (ref 5–15)
AST: 21 U/L (ref 15–41)
BILIRUBIN TOTAL: 0.6 mg/dL (ref 0.3–1.2)
BUN: 12 mg/dL (ref 6–20)
CALCIUM: 8 mg/dL — AB (ref 8.9–10.3)
CO2: 22 mmol/L (ref 22–32)
Chloride: 110 mmol/L (ref 101–111)
Creatinine, Ser: 1.28 mg/dL — ABNORMAL HIGH (ref 0.61–1.24)
GFR calc non Af Amer: 50 mL/min — ABNORMAL LOW (ref >60)
GFR, EST AFRICAN AMERICAN: 58 mL/min — AB (ref >60)
GLUCOSE: 151 mg/dL — AB (ref 65–99)
POTASSIUM: 3.2 mmol/L — AB (ref 3.5–5.1)
SODIUM: 139 mmol/L (ref 135–145)
TOTAL PROTEIN: 6.3 g/dL — AB (ref 6.5–8.1)

## 2016-09-01 LAB — GLUCOSE, CAPILLARY
GLUCOSE-CAPILLARY: 113 mg/dL — AB (ref 65–99)
Glucose-Capillary: 132 mg/dL — ABNORMAL HIGH (ref 65–99)

## 2016-09-01 LAB — MAGNESIUM: Magnesium: 2.2 mg/dL (ref 1.7–2.4)

## 2016-09-01 MED ORDER — TRACE MINERALS CR-CU-MN-SE-ZN 10-1000-500-60 MCG/ML IV SOLN
INTRAVENOUS | Status: AC
  Filled 2016-09-01: qty 960

## 2016-09-01 MED ORDER — INSULIN ASPART 100 UNIT/ML ~~LOC~~ SOLN
0.0000 [IU] | Freq: Four times a day (QID) | SUBCUTANEOUS | Status: DC
  Administered 2016-09-03 (×4): 1 [IU] via SUBCUTANEOUS
  Administered 2016-09-04: 2 [IU] via SUBCUTANEOUS
  Administered 2016-09-04 – 2016-09-05 (×2): 1 [IU] via SUBCUTANEOUS
  Administered 2016-09-05: 2 [IU] via SUBCUTANEOUS

## 2016-09-01 MED ORDER — DEXTROSE-NACL 5-0.45 % IV SOLN
INTRAVENOUS | Status: AC
  Administered 2016-09-01: 17:00:00 via INTRAVENOUS

## 2016-09-01 MED ORDER — POTASSIUM CHLORIDE CRYS ER 20 MEQ PO TBCR
20.0000 meq | EXTENDED_RELEASE_TABLET | ORAL | Status: DC
  Administered 2016-09-01: 20 meq via ORAL
  Filled 2016-09-01 (×2): qty 1

## 2016-09-01 MED ORDER — RIVAROXABAN 20 MG PO TABS
20.0000 mg | ORAL_TABLET | Freq: Every day | ORAL | Status: DC
  Administered 2016-09-01: 20 mg via ORAL
  Filled 2016-09-01: qty 1

## 2016-09-01 MED ORDER — LORAZEPAM 0.5 MG PO TABS: 0.5000 mg | ORAL_TABLET | Freq: Four times a day (QID) | ORAL | Status: DC | PRN

## 2016-09-01 MED ORDER — LORAZEPAM 2 MG/ML PO CONC
0.5000 mg | Freq: Four times a day (QID) | ORAL | Status: DC | PRN
  Administered 2016-09-01 – 2016-09-02 (×4): 0.5 mg via ORAL
  Filled 2016-09-01 (×4): qty 1

## 2016-09-01 MED ORDER — HEPARIN (PORCINE) IN NACL 100-0.45 UNIT/ML-% IJ SOLN
1550.0000 [IU]/h | INTRAMUSCULAR | Status: DC
  Administered 2016-09-02: 1400 [IU]/h via INTRAVENOUS
  Administered 2016-09-03: 1550 [IU]/h via INTRAVENOUS
  Filled 2016-09-01 (×2): qty 250

## 2016-09-01 MED ORDER — POTASSIUM PHOSPHATES 15 MMOLE/5ML IV SOLN
15.0000 mmol | Freq: Once | INTRAVENOUS | Status: AC
  Administered 2016-09-01: 15 mmol via INTRAVENOUS
  Filled 2016-09-01: qty 5

## 2016-09-01 MED ORDER — FAT EMULSION 20 % IV EMUL
250.0000 mL | INTRAVENOUS | Status: AC
  Filled 2016-09-01: qty 250

## 2016-09-01 MED ORDER — DILTIAZEM HCL-DEXTROSE 100-5 MG/100ML-% IV SOLN (PREMIX)
3.0000 mg/h | INTRAVENOUS | Status: DC
  Administered 2016-09-01: 3 mg/h via INTRAVENOUS
  Filled 2016-09-01 (×2): qty 100

## 2016-09-01 MED ORDER — MORPHINE SULFATE (PF) 4 MG/ML IV SOLN
1.0000 mg | INTRAVENOUS | Status: DC | PRN
  Filled 2016-09-01: qty 1

## 2016-09-01 MED ORDER — AMIODARONE HCL 200 MG PO TABS
200.0000 mg | ORAL_TABLET | Freq: Two times a day (BID) | ORAL | Status: DC
  Administered 2016-09-01: 200 mg via ORAL
  Filled 2016-09-01: qty 1

## 2016-09-01 MED ORDER — DILTIAZEM HCL ER COATED BEADS 120 MG PO CP24
120.0000 mg | ORAL_CAPSULE | Freq: Every day | ORAL | Status: DC
  Administered 2016-09-01: 120 mg via ORAL
  Filled 2016-09-01: qty 1

## 2016-09-01 MED ORDER — DEXTROSE-NACL 5-0.45 % IV SOLN
INTRAVENOUS | Status: AC
  Administered 2016-09-01 – 2016-09-04 (×2): via INTRAVENOUS

## 2016-09-01 NOTE — Progress Notes (Signed)
PROGRESS NOTE   Bradley Short  NWG:[id number]    DOB: 1932/12/22    DOA: 08/21/2016  PCP: Manon Hilding, MD   I have briefly reviewed patients previous medical records in [place].  Brief Narrative:   84 PAF on Xarelto, HLD,  OSA on CPAP gout,  presented with multiple episodes of painless BRBPR, weakness, diaphoresis, went to [facility] ED where hemoglobin around 12 g, initially A. fib with RVR which improved with IV fluids,   CT abdomen and pelvis showed sigmoid diverticulosis without acute findings,   transferred to [facility] stepdown unit. Hemodynamically and hemoglobin stable.   [facility] consulted and patient underwent colonoscopy 6/28.   Cecal lesion noted-Gen. surgery consulted  Cardiology provided preop clearance and made recommendations regarding A. fib management that patient developed overnight prior to surgery.  Assessment & Plan:   Principal Problem:   Acute GI bleeding Active Problems:   Pure hypercholesterolemia   ATRIAL FIBRILLATION   Acute blood loss anemia   Gout   Leukocytosis   Thrombocytopenia (HCC)   CKD (chronic kidney disease), stage III   Syncope, vasovagal   H/O colectomy  Acute blood loss anemia: Secondary to GI bleed. Stable. Acute GI bleed, suspect LGIB:   Cecal mass s/p R Colectomy 7/2-HIGH GRADE DYSPLASIA AND SMALL FOCUS  SUSPICIOUS FOR ADENOCARCINOMA. Postop ileus on x-ray 7/6  Preop CT abdomen without contrast 7/1: Sigmoid diverticulosis without acute diverticulitis. No  acute  findings  [facility] follow-up, bleeding source likely diverticular in origin and has clinically resolved.   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    7/6 Still having some distention of abdomen and less passage of gas and stool, two-view x-ray shows ileus, convert all meds back to IV including heparin amiodarone and Cardizem   Bipolar   On Clonopin ~ 3 yrs prior  Situational stressors of  surgery and cancer  Increased ativan to 0.5 q6 prn-->changed to scheudled  Starting Buspar 5 bid--hold given ileus 7/6  .  Thrombocytopenia: Secondary to acute blood loss. Resolved. Leukocytosis: Likely stress response. Resolved.---> White count has gone from the 20s to 8 on 7/6  Syncope:   ? vasovagal due to acute GI bleed. Telemetry without significant arrhythmias as cause. 2-D echo:  LVEF  60-65 percent. Moderate diastolic dysfunction. No significant valvular abnormalities.  Mobilize. PT recommends no follow-up at this time. Paroxysmal atrial fibrillation with RVR-now back on Xarelto 7/4  Not on rate control medications PTA.   Cardiology indicated low to moderate risk for surgery. Developed A. fib with RVR in the 170s  overnight 7/1.  Dr H Discussed with Dr. Johnsie Cancel - adding IV amiodarone given    Cardizem gtt started by cardiology 7/3--now at 5, Amio at 16.7   try Inderal 10 MG 3 times a day if needed for rate control. ?   Defer to cardiology    Hyperlipidemia: Resumed medications post colonoscopy. Gout: No acute flare. Continue allopurinol.  Hypophosphatemia/hypomagensiema:  Stage III chronic kidney disease:   Baseline creatinine not known. Creatinine stable in the 1.2-1.3 range.  Replaced.  ICU electrolyte protocol   DVT prophylaxis: SCDs. Code Status: Full Family Communication:  Disposition:   Consultants:  [facility]  Gen. Surgery Cardiology  Procedures:  Colonoscopy 08/24/16: Impression:               - Preparation of the colon was fair.                           -  One 7 mm polyp in the cecum, removed with a hot                            snare. Complete resection. Polyp tissue not                            retrieved.                           - Likely malignant tumor in the cecum and at the                            appendiceal orifice. Biopsied. Tattooed.                           - Diverticulosis in the sigmoid colon, in the                            descending  colon and at the splenic flexure.                           - Non-bleeding external hemorrhoids.                           - The examination was otherwise normal.  Underwent R lap assist Hemicolectomy 7/2-Dr. Marlou Starks  Pathology results:  Diagnosis Ileocecal valve, biopsy, mass - SESSILE SERRATED POLYP WITH HIGH GRADE DYSPLASIA AND SMALL FOCUS SUSPICIOUS FOR ADENOCARCINOMA.  Antimicrobials:  None    Subjective:   Awake alert anxious No new issue Still not passing much gas and has not passed a further stool Feels a little bit bloated No new other issue No fever No chills No cough No sputum   Objective:  Vitals:   09/01/16 1100 09/01/16 1200 09/01/16 1300 09/01/16 1440  BP: 138/68 (!) 97/49 (!) 117/57 (!) 114/53  Pulse:    80  Resp: (!) 24 (!) 22 (!) 22 (!) 24  Temp:   98.6 F (37 C) 99.3 F (37.4 C)  TempSrc:   Oral Oral  SpO2:    96%  Weight:      Height:        Examination:  Alert pleasant Still a little bit sleepy Chest is clear without wheeze No added sound abd shows patch.  A little bit more distended than priorNo rebound No peritoneal sign  Neuro intact    Data Reviewed: I have personally reviewed following labs and imaging studies  CBC:  Recent Labs Lab 08/28/16 0527 08/29/16 0323 08/30/16 0256 08/31/16 0300 09/01/16 0303  WBC 10.7* 24.9* 19.1* 18.7* 8.3  NEUTROABS  --   --   --   --  6.0  HGB 11.2* 10.7* 9.7* 9.9* 10.2*  HCT 33.6* 32.8* 29.0* 29.4* 30.2*  MCV 89.8 89.4 90.1 89.6 88.8  PLT 191 193 166 189 300   Basic Metabolic Panel:  Recent Labs Lab 08/26/16 0806 08/29/16 0323 08/30/16 0256 08/30/16 0300 08/31/16 0853 09/01/16 0303  NA 140 138 137  --  138 139  K 3.5 4.0 3.8  --  3.6 3.2*  CL 109 107 108  --  107 110  CO2 24 25 23   --  24  22  GLUCOSE 108* 176* 158*  --  141* 151*  BUN 10 13 15   --  14 12  CREATININE 1.33* 1.48* 1.42*  --  1.41* 1.28*  CALCIUM 8.5* 8.2* 8.1*  --  8.3* 8.0*  MG  --   --   --  2.1  --  2.2    PHOS  --   --   --  2.2*  --   --    Liver Function Tests:  Recent Labs Lab 09/01/16 0303  AST 21  ALT 19  ALKPHOS 58  BILITOT 0.6  PROT 6.3*  ALBUMIN 2.7*   Coagulation Profile: No results for input(s): INR, PROTIME in the last 168 hours. CBG: No results for input(s): GLUCAP in the last 168 hours.  Radiology Studies: Dg Chest 2 View  Result Date: 09/01/2016 CLINICAL DATA:  Images obtained for PNA  pneumonia EXAM: CHEST - 2 VIEW COMPARISON:  08/27/2016 FINDINGS: Relatively low lung volumes. Some patchy subsegmental atelectasis versus early interstitial infiltrate at the left lung base and in the right infrahilar region. No confluent airspace consolidation. Heart size and mediastinal contours are within normal limits. Atheromatous aorta. No effusion.  No pneumothorax. Anterior vertebral endplate spurring at multiple levels in the mid and lower thoracic spine. IMPRESSION: 1. Low volumes with some patchy subsegmental atelectasis versus early interstitial infiltrate at the left base and right infrahilar region. Electronically Signed   By: Lucrezia Europe M.D.   On: 09/01/2016 08:41   Dg Abd 2 Views  Result Date: 09/01/2016 CLINICAL DATA:  Ileus following gastrointestinal surgery EXAM: ABDOMEN - 2 VIEW COMPARISON:  CT abdomen and pelvis 08/27/2016 FINDINGS: Gas within stomach. Numerous air-filled loops of small bowel throughout abdomen. Scattered colonic gas. Findings are most consistent with a postoperative ileus. No definite bowel wall thickening or free air. Degenerative changes lumbar spine. Diffuse osseous demineralization. Skin clips at midline in mid abdomen. IMPRESSION: Postoperative ileus. Electronically Signed   By: Lavonia Dana M.D.   On: 09/01/2016 15:47     Scheduled Meds: . insulin aspart  0-9 Units Subcutaneous Q6H  . metoCLOPramide (REGLAN) injection  5 mg Intravenous Q8H   Continuous Infusions: . amiodarone 30 mg/hr (09/01/16 0500)  . dextrose 5 % and 0.45% NaCl    .  dextrose 5 % and 0.45% NaCl    . diltiazem (CARDIZEM) infusion 3 mg/hr (09/01/16 1402)  . Marland KitchenTPN (CLINIMIX-E) Adult     And  . fat emulsion    . potassium phosphate IVPB (mmol) 15 mmol (09/01/16 1349)     LOS: 11 days    Verneita Griffes, MD [facility] [phone]   09/01/2016, 3:58 PM

## 2016-09-01 NOTE — Progress Notes (Signed)
Physical Therapy Treatment Patient Details Name: Bradley Short MRN: [id number] DOB: 10-08-1932 Today's Date: 09/01/2016    History of Present Illness 83 year old male who was admitted to the hospital with acute lower GI bleeding.  Pt with hx of chronic atrial fibrillation and is on chronic anticoagulation. Pt underwent a colonoscopy which demonstrated diverticular disease (this is felt to be the source of his bleeding) (and a incidentally found neoplasm in the cecum at the appendiceal orifice that was suspicious for malignancy and pt with plans for lap assisted R colectomy Monday     PT Comments    Pt continues cooperative/motivated but struggling to progress with increasing fatigue/weakness.  Pt states has been unable to eat for over a week.  Based on decline, pt could benefit from follow up rehab at SNF level to maximize IND and safety prior to return home.   Follow Up Recommendations  SNF     Equipment Recommendations  None recommended by PT    Recommendations for Other Services OT consult     Precautions / Restrictions Precautions Precautions: Fall Restrictions Weight Bearing Restrictions: No    Mobility  Bed Mobility Overal bed mobility: Needs Assistance Bed Mobility: Supine to Sit     Supine to sit: Mod assist     General bed mobility comments: increased time and mod assist to complete log roll and bring trunk to upright  Transfers Overall transfer level: Needs assistance Equipment used: Rolling walker (2 wheeled) Transfers: Sit to/from Stand Sit to Stand: Min assist;+2 physical assistance;+2 safety/equipment;From elevated surface         General transfer comment: verbal cues for hand placement  Ambulation/Gait Ambulation/Gait assistance: Min assist;+2 physical assistance;+2 safety/equipment Ambulation Distance (Feet): 75 Feet Assistive device: Rolling walker (2 wheeled) Gait Pattern/deviations: Step-through pattern;Decreased step length - right;Decreased  step length - left;Shuffle;Trunk flexed Gait velocity: decr Gait velocity interpretation: Below normal speed for age/gender General Gait Details: cues for posture and position from RW.  Increased time and multiple rests required.  Distance ltd by fatigue   Stairs            Wheelchair Mobility    Modified Rankin (Stroke Patients Only)       Balance Overall balance assessment: Needs assistance Sitting-balance support: Single extremity supported;Feet supported Sitting balance-Leahy Scale: Fair     Standing balance support: Bilateral upper extremity supported Standing balance-Leahy Scale: Poor                              Cognition Arousal/Alertness: Awake/alert Behavior During Therapy: WFL for tasks assessed/performed;Anxious Overall Cognitive Status: Within Functional Limits for tasks assessed                                        Exercises      General Comments        Pertinent Vitals/Pain Pain Assessment: No/denies pain    Home Living                      Prior Function            PT Goals (current goals can now be found in the care plan section) Acute Rehab PT Goals Patient Stated Goal: Walk PT Goal Formulation: With patient Time For Goal Achievement: 09/01/16 Potential to Achieve Goals: Good Progress towards PT goals: Not progressing toward goals -  comment (Increased fatigue/weakness - pt states has not eaten 1 week+)    Frequency    Min 3X/week      PT Plan Discharge plan needs to be updated    Co-evaluation              AM-PAC PT "6 Clicks" Daily Activity  Outcome Measure  Difficulty turning over in bed (including adjusting bedclothes, sheets and blankets)?: Total Difficulty moving from lying on back to sitting on the side of the bed? : Total Difficulty sitting down on and standing up from a chair with arms (e.g., wheelchair, bedside commode, etc,.)?: Total Help needed moving to and from a  bed to chair (including a wheelchair)?: A Lot Help needed walking in hospital room?: A Lot Help needed climbing 3-5 steps with a railing? : A Lot 6 Click Score: 9    End of Session Equipment Utilized During Treatment: Gait belt Activity Tolerance: Patient tolerated treatment well;Patient limited by fatigue Patient left: in chair;with call bell/phone within reach;with chair alarm set Nurse Communication: Mobility status PT Visit Diagnosis: Difficulty in walking, not elsewhere classified (R26.2)     Time: 1191-4782 PT Time Calculation (min) (ACUTE ONLY): 38 min  Charges:  $Gait Training: 23-37 mins $Therapeutic Activity: 8-22 mins                    G Codes:       Pg [phone]    Toney Lizaola 09/01/2016, 1:00 PM

## 2016-09-01 NOTE — Progress Notes (Signed)
ANTICOAGULATION CONSULT NOTE - Follow Up Consult  Pharmacy Consult for Xarelto transitioned back to Heparin Indication: atrial fibrillation  Allergies  Allergen Reactions  . Diltiazem Hcl     REACTION: Looked like burned all over  . Lisinopril     REACTION: Looked like burned all over  . Metoprolol Succinate     REACTION: Looked like burned all over  . Propafenone Hcl     REACTION: Looked like burned all over   Patient Measurements: Height: 5\' 11"  (180.3 cm) Weight: 214 lb 1.1 oz (97.1 kg) IBW/kg (Calculated) : 75.3  Vital Signs: Temp: 99.3 F (37.4 C) (07/06 1440) Temp Source: Oral (07/06 1440) BP: 114/53 (07/06 1440) Pulse Rate: 80 (07/06 1440)  Labs:  Recent Labs  08/30/16 0256 08/30/16 0804 08/30/16 1810 08/31/16 0300 08/31/16 0853 09/01/16 0303  HGB 9.7*  --   --  9.9*  --  10.2*  HCT 29.0*  --   --  29.4*  --  30.2*  PLT 166  --   --  189  --  240  HEPARINUNFRC  --  0.18* 0.27* 0.36  --   --   CREATININE 1.42*  --   --   --  1.41* 1.28*   Estimated Creatinine Clearance: 52.9 mL/min (A) (by C-G formula based on SCr of 1.28 mg/dL (H)).  Medications:  Scheduled:  . insulin aspart  0-9 Units Subcutaneous Q6H  . metoCLOPramide (REGLAN) injection  5 mg Intravenous Q8H   Infusions:  . amiodarone 30 mg/hr (09/01/16 0500)  . dextrose 5 % and 0.45% NaCl    . dextrose 5 % and 0.45% NaCl    . diltiazem (CARDIZEM) infusion 3 mg/hr (09/01/16 1402)  . Marland KitchenTPN (CLINIMIX-E) Adult Stopped (09/01/16 1620)   And  . fat emulsion Stopped (09/01/16 1621)  . potassium phosphate IVPB (mmol) 15 mmol (09/01/16 1349)   Assessment: 84 yoM admitted on 6/25 with rectal bleeding and PMH significant for afib on chronic Xarelto anticoagulation.  Xarelto was held for colonoscopy on 6/28 and Heparin IV was started on 6/29.  No active bleeding found, but incidentally found cecal mass that was suspicious for malignancy.  Surgery completed colectomy on 7/2 and Heparin was resumed on 7/3.   Pharmacy was consulted to resume Xarelto 7/5  PTA Xarelto 20mg  daily, last dose PTA was 6/24, restarted 7/5 Heparin IV 6/29-7/5, now resumed 7/6  Today, 09/01/2016:  CBC: Hgb 10.2 is improved/stable, Plt WNL  SCr decreased to 1.28 and CrCl ~ 60 ml/min using TBW  Diet: clear liquids, but pt reports increasing abdominal distension.  CCS concerned for ileus, TPN ordered - unable to place PICC line 7/6 pm  Note drug-drug interaction with Diltiazem and Amiodarone (Diltiazem moderately inhibits CYP3A4, Amiodarone inhibits PGP) which may increase the serum concentration of Rivaroxaban.  The significance of this interaction is expected to be greater in patients with renal impairment.  No dosing changes are recommended.  Xarelto 20mg  daily given this am 0925, now plan back to Heparin infusion  Goal of Therapy:  Heparin level 0.3-0.5 units/ml - aiming for lower end of range due to risk of bleeding APTT 66-85 sec (corresponds to 0.3-0.5 Hep level) Monitor platelets by anticoagulation protocol: Yes   Plan:   Xarelto discontinued  Resume Heparin infusion at 1400 units/hr, no bolus; at 0900 tomorrow 7/7 (24 hr after last Xarelto)  Check Heparin level and aPTT 6 hr after resuming Heparin (3pm on 7/7)  Daily Heparin level when at steady state, continue aPTT until  they correlate with Hep levels  Monitor for signs and symptoms of bleeding.  Minda Ditto PharmD Pager [phone] 09/01/2016, 4:43 PM

## 2016-09-01 NOTE — Progress Notes (Signed)
Progress Note  Patient Name: Bradley Short Date of Encounter: 09/01/2016  Primary Cardiologist: Hochrein, allred   Subjective   No chest pain or dyspnea. Not taking much in orally, only sips. Abdomen only mildly tender, feels bloated. He is weak and not moving much.   Inpatient Medications    Scheduled Meds: . allopurinol  100 mg Oral Daily  . busPIRone  5 mg Oral BID  . feeding supplement  1 Container Oral TID BM  . feeding supplement (ENSURE ENLIVE)  237 mL Oral BID BM  . LORazepam  0.5 mg Intravenous Q6H  . metoCLOPramide (REGLAN) injection  5 mg Intravenous Q8H  . rivaroxaban  15 mg Oral Daily   Continuous Infusions: . amiodarone 30 mg/hr (09/01/16 0500)  . dextrose 5 % and 0.45 % NaCl with KCl 20 mEq/L 50 mL/hr at 09/01/16 0500  . diltiazem (CARDIZEM) infusion 5 mg/hr (09/01/16 0500)   PRN Meds: fentaNYL (SUBLIMAZE) injection, morphine injection, ondansetron **OR** ondansetron (ZOFRAN) IV   Vital Signs    Vitals:   09/01/16 0200 09/01/16 0400 09/01/16 0423 09/01/16 0500  BP: 100/62 123/72  (!) 126/54  Pulse: 98 (!) 107  (!) 103  Resp: (!) 23 (!) 21  18  Temp:   97.9 F (36.6 C)   TempSrc:   Oral   SpO2: 94% 96%  95%  Weight:   214 lb 1.1 oz (97.1 kg)   Height:        Intake/Output Summary (Last 24 hours) at 09/01/16 0733 Last data filed at 09/01/16 0500  Gross per 24 hour  Intake          1894.43 ml  Output              400 ml  Net          1494.43 ml   Filed Weights   08/26/16 0430 08/28/16 1700 09/01/16 0423  Weight: 204 lb 3.2 oz (92.6 kg) 204 lb 2.3 oz (92.6 kg) 214 lb 1.1 oz (97.1 kg)    Telemetry    Afib rate controlled in the 80's and 90's - Personally Reviewed  ECG    No new tracings  Physical Exam  Physical Exam  Constitutional: He is oriented to person, place, and time. He appears well-developed and well-nourished. No distress.  HENT:  Head: Normocephalic.  Neck: No JVD present.  Cardiovascular: Exam reveals no gallop and no  friction rub.   No murmur heard. Irregularly irregular rhythm  Pulmonary/Chest: Effort normal and breath sounds normal.  Diminished in the bases  Abdominal: Soft. There is tenderness.  Musculoskeletal: He exhibits edema.  Trace pretibial edema of LLE  Neurological: He is alert and oriented to person, place, and time.  Skin: Skin is warm and dry.     Labs    Chemistry  Recent Labs Lab 08/30/16 0256 08/31/16 0853 09/01/16 0303  NA 137 138 139  K 3.8 3.6 3.2*  CL 108 107 110  CO2 23 24 22   GLUCOSE 158* 141* 151*  BUN 15 14 12   CREATININE 1.42* 1.41* 1.28*  CALCIUM 8.1* 8.3* 8.0*  PROT  --   --  6.3*  ALBUMIN  --   --  2.7*  AST  --   --  21  ALT  --   --  19  ALKPHOS  --   --  58  BILITOT  --   --  0.6  GFRNONAA 44* 45* 50*  GFRAA 52* 52* 58*  ANIONGAP 6 7  7     Hematology  Recent Labs Lab 08/30/16 0256 08/31/16 0300 09/01/16 0303  WBC 19.1* 18.7* 8.3  RBC 3.22* 3.28* 3.40*  HGB 9.7* 9.9* 10.2*  HCT 29.0* 29.4* 30.2*  MCV 90.1 89.6 88.8  MCH 30.1 30.2 30.0  MCHC 33.4 33.7 33.8  RDW 14.9 14.9 15.3  PLT 166 189 240    Cardiac EnzymesNo results for input(s): TROPONINI in the last 168 hours. No results for input(s): TROPIPOC in the last 168 hours.   BNPNo results for input(s): BNP, PROBNP in the last 168 hours.   DDimer No results for input(s): DDIMER in the last 168 hours.   Radiology    No results found.  Cardiac Studies   Echo 08/23/16  Study Conclusions - Procedure narrative: Transthoracic echocardiography. Image   quality was poor. The study was technically difficult, as a   result of poor sound wave transmission. - Left ventricle: The cavity size was normal. Wall thickness was   increased in a pattern of mild LVH. Systolic function was normal.   The estimated ejection fraction was in the range of 60% to 65%.   Although no diagnostic regional wall motion abnormality was   identified, this possibility cannot be completely excluded on the    basis of this study. Features are consistent with a pseudonormal   left ventricular filling pattern, with concomitant abnormal   relaxation and increased filling pressure (grade 2 diastolic   dysfunction). - Aortic valve: There was no stenosis. - Mitral valve: Mildly calcified annulus. There was trivial   regurgitation. - Left atrium: The atrium was mildly to moderately dilated. - Right ventricle: The cavity size was normal. Systolic function   was normal. - Pulmonary arteries: No complete TR doppler jet so unable to   estimate PA systolic pressure. - Inferior vena cava: The vessel was normal in size. The   respirophasic diameter changes were in the normal range (>= 50%),   consistent with normal central venous pressure.  Impressions: - Normal LV size with mild LV hypertrophy. EF 60-65%. Moderate   diastolic dysfunction. Normal RV size and systolic function.   Moderate LAE. No significant valvular abnormalities.   Patient Profile     83 y.o. male with PAF CHAD2VASC 3 on xarelto at home Hisotry of afib/flutter ablation 2012 Normal EF 60-65% no history of CAD Unclear reactions to a combination of meds at [facility] Years ago and listed as intolerence to cardizem beta blocker and propafenone. "Looked like he was burnt"  S/P right colectomy 08/28/16   Assessment & Plan    1. PAF:  Pt with previous multiple allergic reactions that was unclear as to which medications caused. Is now rate controlled on cardizem and amiodarone which he is tolerating well. BP is  stable. Pt is on IV heparin until taking orals. Started clear liquids yesterday but has only been taking sips. Will switch cardizem and amiodarone to oral dosing once pt tolerating oral diet.   2. Hypokalemia: serum potassium 3.2 this am. Will replace.  Signed, Daune Perch, NP  09/01/2016, 7:33 AM    Patient examined chart reviewed Maintaining NSR agree with continuing iv meds until taking PO better at risk for ileus Supplement K  would change to po amiodarone and PO inderal over weekend once oral intake better. Sluggish BS no  Distension on exam  Jenkins Rouge

## 2016-09-01 NOTE — Progress Notes (Signed)
Patient ID: [id number] Bradley Short, male   DOB: Jul 16, 1932, 83 y.o.   MRN: [id number]  [facility] Surgery Progress Note  4 Days Post-Op  Subjective: CC- tired Wife at bedside. Patient states that he had multiple loose BM's yesterday. He was passing flatus yesterday, but none today. Feels more distended. No n/v. States that he was very weak yesterday and unable to ambulate as much as the previous day.  Objective: Vital signs in last 24 hours: Temp:  [97.6 F (36.4 C)-98.4 F (36.9 C)] 97.9 F (36.6 C) (07/06 0423) Pulse Rate:  [67-115] 103 (07/06 0500) Resp:  [17-28] 18 (07/06 0500) BP: (94-132)/(47-81) 126/54 (07/06 0500) SpO2:  [89 %-100 %] 95 % (07/06 0500) Weight:  [214 lb 1.1 oz (97.1 kg)] 214 lb 1.1 oz (97.1 kg) (07/06 0423) Last BM Date: 08/31/16  Intake/Output from previous day: 07/05 0701 - 07/06 0700 In: 1894.4 [I.V.:1794.4; IV Piggyback:100] Out: 400 [Urine:400] Intake/Output this shift: No intake/output data recorded.  PE: Gen:  Alert, NAD, pleasant HEENT: EOM's intact, pupils equal  Card:  Irregularly irregular Pulm:  CTAB, no W/R/R, effort normal Abd: Soft, moderate distension, few BS heard, midline incision C/D/I with staples intact and honeycomb dressing in place Psych: A&Ox3  Skin: no rashes noted, warm and dry  Pulling 1750 on IS  Lab Results:   Recent Labs  08/31/16 0300 09/01/16 0303  WBC 18.7* 8.3  HGB 9.9* 10.2*  HCT 29.4* 30.2*  PLT 189 240   BMET  Recent Labs  08/31/16 0853 09/01/16 0303  NA 138 139  K 3.6 3.2*  CL 107 110  CO2 24 22  GLUCOSE 141* 151*  BUN 14 12  CREATININE 1.41* 1.28*  CALCIUM 8.3* 8.0*   PT/INR No results for input(s): LABPROT, INR in the last 72 hours. CMP     Component Value Date/Time   NA 139 09/01/2016 0303   K 3.2 (L) 09/01/2016 0303   CL 110 09/01/2016 0303   CO2 22 09/01/2016 0303   GLUCOSE 151 (H) 09/01/2016 0303   BUN 12 09/01/2016 0303   CREATININE 1.28 (H) 09/01/2016 0303   CALCIUM 8.0 (L)  09/01/2016 0303   PROT 6.3 (L) 09/01/2016 0303   ALBUMIN 2.7 (L) 09/01/2016 0303   AST 21 09/01/2016 0303   ALT 19 09/01/2016 0303   ALKPHOS 58 09/01/2016 0303   BILITOT 0.6 09/01/2016 0303   GFRNONAA 50 (L) 09/01/2016 0303   GFRAA 58 (L) 09/01/2016 0303   Lipase  No results found for: LIPASE     Studies/Results: No results found.  Anti-infectives: Anti-infectives    Start     Dose/Rate Route Frequency Ordered Stop   08/28/16 1133  cefoTEtan in Dextrose 5% (CEFOTAN) 2-2.08 GM-% IVPB    Comments:  [name], Bella Kennedy   : cabinet override      08/28/16 1133 08/28/16 1145   08/28/16 0945  cefoTEtan (CEFOTAN) 2 g in dextrose 5 % 50 mL IVPB     2 g 100 mL/hr over 30 Minutes Intravenous On call to O.R. 08/28/16 0941 08/28/16 1710       Assessment/Plan Acute lower GIB - etiology diverticular disease vs malignancy  S/P COLONOSCOPY 6/28: Cecal/appendiceal orifice mass - BX shows high grade dysplasia with small focus conerning for adenocarcinoma  S/P LAPAROSCOPIC ASSISTED RIGHT COLECTOMY 08/28/16 Dr. 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 Ok to continue few clear liquids. Needs to ambulate/OOB to chair more today. Continue PT. May need to consider TNA if ileus does not resolve. -  Honeycomb dressing can be removed tomorrow  Hypokalemia - replace K ABL anemia- hgb 10.2, stable Paroxysmal atrial fibrillation - restarted Xarelto. On amiodarone gtt. Cards following.  HLD CKD, stage III   Gout - allopurinol  FEN- IVF, clear liquid diet ID- perioperative cefotetan 7/2; WBC trending down 8.3/afebrile VTE- SCD's, xarelto    LOS: 11 days    Jerrye Beavers , [facility]  Surgery 09/01/2016, 7:35 AM Pager: [phone] Consults: [phone] Mon-Fri 7:00 am-4:30 pm Sat-Sun 7:00 am-11:30 am

## 2016-09-01 NOTE — Progress Notes (Signed)
[place] NOTE   Pharmacy Consult for TPN Indication: prolonged ileus  Patient Measurements: Height: 5\' 11"  (180.3 cm) Weight: 214 lb 1.1 oz (97.1 kg) IBW/kg (Calculated) : 75.3 TPN AdjBW (KG): 92.6 Body mass index is 29.86 kg/m.  Assessment: 28 yoM admitted on 6/25 with rectal bleeding, s/p colonoscopy on 6/28 and colectomy on 7/2.  He now has prolonged ileus and Pharmacy is consulted to dose TPN.  Insulin Requirements: none  Current Nutrition: Clear liquid diet  IVF: none  Central access: PICC line ordered 7/6 TPN start date: 7/6, pending central access placement  Today:    Glucose - 151, no hx of DM  Electrolytes - K low today (replacing PO), Phos low on 7/4 (not previously replaced or rechecked.  Replacing IV today.)  Others WNL including CorrCa and Mag.  Renal - SCr 1.28, improving  LFTs - WNL (7/6)  TGs -   Prealbumin -  NUTRITIONAL GOALS                                                                                             RD recs (7/5):  Kcal:  2100-2300, Protein:  110-120g  Clinimix 5/20 at a goal rate of 83 ml/hr + 20% fat emulsion at 10 ml/hr to provide 100 g of protein and 2233 kCals per day meeting 91 % of protein and 100 % of kCal needs.   PLAN                                                                                                                          KCl 40 mEq PO and KPhos 15 mmol IV replacement on 7/6  At 1800 today:  Start Clinimix E 5/20 at 40 ml/hr.  20% fat emulsion at 10 ml/hr.  Plan to advance as tolerated to the goal rate.  TPN to contain standard multivitamins daily and trace elements on MWF only  IVF per MD  Add sensitive SSI and CBGs q6h  TPN lab panels on Mondays & Thursdays.  CMET, Mag, and Phos tomorrow AM.  F/u daily.  Gretta Arab PharmD, BCPS Pager [phone] 09/01/2016 2:04 PM

## 2016-09-01 NOTE — Progress Notes (Signed)
ANTICOAGULATION CONSULT NOTE - Follow Up Consult  Pharmacy Consult for Xarelto Indication: atrial fibrillation  Allergies  Allergen Reactions  . Diltiazem Hcl     REACTION: Looked like burned all over  . Lisinopril     REACTION: Looked like burned all over  . Metoprolol Succinate     REACTION: Looked like burned all over  . Propafenone Hcl     REACTION: Looked like burned all over    Patient Measurements: Height: 5\' 11"  (180.3 cm) Weight: 214 lb 1.1 oz (97.1 kg) IBW/kg (Calculated) : 75.3  Vital Signs: Temp: 97.9 F (36.6 C) (07/06 0423) Temp Source: Oral (07/06 0423) BP: 126/54 (07/06 0500) Pulse Rate: 103 (07/06 0500)  Labs:  Recent Labs  08/30/16 0256 08/30/16 0804 08/30/16 1810 08/31/16 0300 08/31/16 0853 09/01/16 0303  HGB 9.7*  --   --  9.9*  --  10.2*  HCT 29.0*  --   --  29.4*  --  30.2*  PLT 166  --   --  189  --  240  HEPARINUNFRC  --  0.18* 0.27* 0.36  --   --   CREATININE 1.42*  --   --   --  1.41* 1.28*    Estimated Creatinine Clearance: 52.9 mL/min (A) (by C-G formula based on SCr of 1.28 mg/dL (H)).   Medications:  Scheduled:  . allopurinol  100 mg Oral Daily  . busPIRone  5 mg Oral BID  . feeding supplement  1 Container Oral TID BM  . feeding supplement (ENSURE ENLIVE)  237 mL Oral BID BM  . LORazepam  0.5 mg Intravenous Q6H  . metoCLOPramide (REGLAN) injection  5 mg Intravenous Q8H  . potassium chloride  20 mEq Oral Q4H  . rivaroxaban  15 mg Oral Daily   Infusions:  . amiodarone 30 mg/hr (09/01/16 0500)  . dextrose 5 % and 0.45 % NaCl with KCl 20 mEq/L 50 mL/hr at 09/01/16 0500  . diltiazem (CARDIZEM) infusion 5 mg/hr (09/01/16 0500)    Assessment: 45 yoM admitted on 6/25 with rectal bleeding and PMH significant for afib on chronic Xarelto anticoagulation.  Xarelto was held for colonoscopy on 6/28 and Heparin IV was started on 6/29.  No active bleeding found, but incidentally found cecal mass that was suspicious for malignancy.   Surgery completed colectomy on 7/2 and Heparin was resumed on 7/3.  Pharmacy is now consulted to resume Xarelto.  PTA Xarelto 20mg  daily, last dose on 6/24 Heparin IV 6/29-7/5  Today, 09/01/2016:  CBC: Hgb 10.2 is improved/stable, Plt WNL  SCr decreased to 1.28 and CrCl ~ 60 ml/min using TBW  Diet: clear liquids, but pt reports increasing abdominal distension.  CCS concerned for ileus  Note drug-drug interaction with Diltiazem and Amiodarone (Diltiazem moderately inhibits CYP3A4, Amiodarone inhibits PGP) which may increase the serum concentration of Rivaroxaban.  The significance of this interaction is expected to be greater in patients with renal impairment.  No dosing changes are recommended.   Goal of Therapy:  Monitor platelets by anticoagulation protocol: Yes   Plan:   Increase to Xarelto 20 mg PO daily.  Monitor ability to tolerate PO medications.  Monitor for signs and symptoms of bleeding.   Gretta Arab PharmD, BCPS Pager [phone] 09/01/2016,8:52 AM

## 2016-09-01 NOTE — Progress Notes (Signed)
Contacted IV team RN regarding PICC placement. Picc Rn will be available for placement on 09/02/16.

## 2016-09-02 ENCOUNTER — Inpatient Hospital Stay (HOSPITAL_COMMUNITY): Payer: Medicare Other

## 2016-09-02 LAB — CBC
HEMATOCRIT: 27.7 % — AB (ref 39.0–52.0)
HEMOGLOBIN: 9.4 g/dL — AB (ref 13.0–17.0)
MCH: 30 pg (ref 26.0–34.0)
MCHC: 33.9 g/dL (ref 30.0–36.0)
MCV: 88.5 fL (ref 78.0–100.0)
Platelets: 234 10*3/uL (ref 150–400)
RBC: 3.13 MIL/uL — AB (ref 4.22–5.81)
RDW: 15.5 % (ref 11.5–15.5)
WBC: 9.1 10*3/uL (ref 4.0–10.5)

## 2016-09-02 LAB — PHOSPHORUS: Phosphorus: 2.8 mg/dL (ref 2.5–4.6)

## 2016-09-02 LAB — COMPREHENSIVE METABOLIC PANEL
ALBUMIN: 2.7 g/dL — AB (ref 3.5–5.0)
ALT: 30 U/L (ref 17–63)
AST: 34 U/L (ref 15–41)
Alkaline Phosphatase: 69 U/L (ref 38–126)
Anion gap: 5 (ref 5–15)
BUN: 13 mg/dL (ref 6–20)
CHLORIDE: 107 mmol/L (ref 101–111)
CO2: 25 mmol/L (ref 22–32)
CREATININE: 1.45 mg/dL — AB (ref 0.61–1.24)
Calcium: 8 mg/dL — ABNORMAL LOW (ref 8.9–10.3)
GFR calc Af Amer: 50 mL/min — ABNORMAL LOW (ref >60)
GFR calc non Af Amer: 43 mL/min — ABNORMAL LOW (ref >60)
GLUCOSE: 130 mg/dL — AB (ref 65–99)
POTASSIUM: 3.4 mmol/L — AB (ref 3.5–5.1)
Sodium: 137 mmol/L (ref 135–145)
Total Bilirubin: 0.7 mg/dL (ref 0.3–1.2)
Total Protein: 5.9 g/dL — ABNORMAL LOW (ref 6.5–8.1)

## 2016-09-02 LAB — DIFFERENTIAL
BASOS ABS: 0 10*3/uL (ref 0.0–0.1)
Basophils Relative: 0 %
Eosinophils Absolute: 0.3 10*3/uL (ref 0.0–0.7)
Eosinophils Relative: 3 %
LYMPHS ABS: 1.2 10*3/uL (ref 0.7–4.0)
LYMPHS PCT: 13 %
MONOS PCT: 12 %
Monocytes Absolute: 1.1 10*3/uL — ABNORMAL HIGH (ref 0.1–1.0)
NEUTROS ABS: 6.5 10*3/uL (ref 1.7–7.7)
Neutrophils Relative %: 72 %

## 2016-09-02 LAB — GLUCOSE, CAPILLARY
GLUCOSE-CAPILLARY: 114 mg/dL — AB (ref 65–99)
Glucose-Capillary: 114 mg/dL — ABNORMAL HIGH (ref 65–99)
Glucose-Capillary: 119 mg/dL — ABNORMAL HIGH (ref 65–99)
Glucose-Capillary: 137 mg/dL — ABNORMAL HIGH (ref 65–99)

## 2016-09-02 LAB — HEPARIN LEVEL (UNFRACTIONATED): HEPARIN UNFRACTIONATED: 0.24 [IU]/mL — AB (ref 0.30–0.70)

## 2016-09-02 LAB — TRIGLYCERIDES: Triglycerides: 139 mg/dL (ref <150)

## 2016-09-02 LAB — APTT: aPTT: 73 seconds — ABNORMAL HIGH (ref 24–36)

## 2016-09-02 LAB — MAGNESIUM: MAGNESIUM: 2.3 mg/dL (ref 1.7–2.4)

## 2016-09-02 LAB — PREALBUMIN: Prealbumin: 10.3 mg/dL — ABNORMAL LOW (ref 18–38)

## 2016-09-02 MED ORDER — FAT EMULSION 20 % IV EMUL
240.0000 mL | INTRAVENOUS | Status: AC
  Administered 2016-09-02: 240 mL via INTRAVENOUS
  Filled 2016-09-02: qty 240

## 2016-09-02 MED ORDER — POTASSIUM CHLORIDE 10 MEQ/100ML IV SOLN
10.0000 meq | INTRAVENOUS | Status: DC
  Filled 2016-09-02 (×4): qty 100

## 2016-09-02 MED ORDER — POTASSIUM CHLORIDE 20 MEQ/15ML (10%) PO SOLN
40.0000 meq | Freq: Once | ORAL | Status: AC
  Administered 2016-09-02: 40 meq via ORAL
  Filled 2016-09-02: qty 30

## 2016-09-02 MED ORDER — DILTIAZEM HCL 100 MG IV SOLR: 3.0000 mg/h | INTRAVENOUS | Status: DC

## 2016-09-02 MED ORDER — SODIUM CHLORIDE 0.9% FLUSH
10.0000 mL | INTRAVENOUS | Status: DC | PRN
  Administered 2016-09-04 – 2016-09-05 (×2): 10 mL
  Filled 2016-09-02 (×2): qty 40

## 2016-09-02 MED ORDER — METOCLOPRAMIDE HCL 5 MG/ML IJ SOLN
5.0000 mg | Freq: Three times a day (TID) | INTRAMUSCULAR | Status: AC
  Administered 2016-09-02 – 2016-09-03 (×5): 5 mg via INTRAVENOUS
  Filled 2016-09-02 (×5): qty 2

## 2016-09-02 MED ORDER — TRACE MINERALS CR-CU-MN-SE-ZN 10-1000-500-60 MCG/ML IV SOLN
INTRAVENOUS | Status: AC
  Administered 2016-09-02: 18:00:00 via INTRAVENOUS
  Filled 2016-09-02: qty 960

## 2016-09-02 MED ORDER — DILTIAZEM HCL 100 MG IV SOLR
3.0000 mg/h | INTRAVENOUS | Status: DC
  Administered 2016-09-02: 3 mg/h via INTRAVENOUS
  Filled 2016-09-02: qty 100

## 2016-09-02 MED ORDER — POTASSIUM CHLORIDE 20 MEQ/15ML (10%) PO SOLN: 40.0000 meq | Freq: Once | ORAL | Status: DC

## 2016-09-02 MED ORDER — KCL IN DEXTROSE-NACL 20-5-0.45 MEQ/L-%-% IV SOLN
INTRAVENOUS | Status: DC
  Filled 2016-09-02: qty 1000

## 2016-09-02 NOTE — Progress Notes (Signed)
Peripherally Inserted Central Catheter/Midline Placement  The IV Nurse has discussed with the patient and/or persons authorized to consent for the patient, the purpose of this procedure and the potential benefits and risks involved with this procedure.  The benefits include less needle sticks, lab draws from the catheter, and the patient may be discharged home with the catheter. Risks include, but not limited to, infection, bleeding, blood clot (thrombus formation), and puncture of an artery; nerve damage and irregular heartbeat and possibility to perform a PICC exchange if needed/ordered by physician.  Alternatives to this procedure were also discussed.  Bard Power PICC patient education guide, fact sheet on infection prevention and patient information card has been provided to patient /or left at bedside.    PICC/Midline Placement Documentation        Bradley Short 09/02/2016, 4:57 PM

## 2016-09-02 NOTE — Progress Notes (Signed)
Physical Therapy Treatment Patient Details Name: Bradley Short MRN: [id number] DOB: March 17, 1932 Today's Date: 09/02/2016    History of Present Illness 83 year old male who was admitted to the hospital with acute lower GI bleeding.  Pt with hx of chronic atrial fibrillation and is on chronic anticoagulation. Pt underwent a colonoscopy which demonstrated diverticular disease (this is felt to be the source of his bleeding) (and a incidentally found neoplasm in the cecum at the appendiceal orifice that was suspicious for malignancy and pt with plans for lap assisted R colectomy Monday     PT Comments    Marked improvement in activity tolerance with noted increased speed and stability during ambulation.   Follow Up Recommendations  SNF     Equipment Recommendations  None recommended by PT    Recommendations for Other Services OT consult     Precautions / Restrictions Precautions Precautions: Fall Restrictions Weight Bearing Restrictions: No    Mobility  Bed Mobility Overal bed mobility: Needs Assistance Bed Mobility: Sit to Supine       Sit to supine: Min assist;Mod assist   General bed mobility comments: increased time and assist to manage LEs  Transfers Overall transfer level: Needs assistance Equipment used: Rolling walker (2 wheeled) Transfers: Sit to/from Stand Sit to Stand: Min assist         General transfer comment: verbal cues for hand placement  Ambulation/Gait Ambulation/Gait assistance: Min assist;+2 safety/equipment Ambulation Distance (Feet): 200 Feet Assistive device: Rolling walker (2 wheeled) Gait Pattern/deviations: Step-through pattern;Decreased step length - right;Decreased step length - left;Shuffle;Trunk flexed Gait velocity: decr Gait velocity interpretation: Below normal speed for age/gender General Gait Details: cues for posture and position from RW.  Increased time and several short rests required.     Stairs            Wheelchair  Mobility    Modified Rankin (Stroke Patients Only)       Balance Overall balance assessment: Needs assistance Sitting-balance support: Single extremity supported;Feet supported Sitting balance-Leahy Scale: Fair     Standing balance support: Bilateral upper extremity supported Standing balance-Leahy Scale: Poor                              Cognition Arousal/Alertness: Awake/alert Behavior During Therapy: WFL for tasks assessed/performed;Anxious Overall Cognitive Status: Within Functional Limits for tasks assessed                                        Exercises      General Comments        Pertinent Vitals/Pain Pain Assessment: No/denies pain    Home Living                      Prior Function            PT Goals (current goals can now be found in the care plan section) Acute Rehab PT Goals Patient Stated Goal: Walk PT Goal Formulation: With patient Time For Goal Achievement: 09/01/16 Potential to Achieve Goals: Good Progress towards PT goals: Progressing toward goals    Frequency    Min 3X/week      PT Plan Discharge plan needs to be updated    Co-evaluation              AM-PAC PT "6 Clicks" Daily Activity  Outcome Measure  Difficulty turning over in bed (including adjusting bedclothes, sheets and blankets)?: Total Difficulty moving from lying on back to sitting on the side of the bed? : Total Difficulty sitting down on and standing up from a chair with arms (e.g., wheelchair, bedside commode, etc,.)?: Total Help needed moving to and from a bed to chair (including a wheelchair)?: A Lot Help needed walking in hospital room?: A Little Help needed climbing 3-5 steps with a railing? : A Lot 6 Click Score: 10    End of Session Equipment Utilized During Treatment: Gait belt Activity Tolerance: Patient tolerated treatment well Patient left: in bed;with call bell/phone within reach;with family/visitor  present Nurse Communication: Mobility status PT Visit Diagnosis: Difficulty in walking, not elsewhere classified (R26.2)     Time: 5271-2929 PT Time Calculation (min) (ACUTE ONLY): 16 min  Charges:  $Gait Training: 8-22 mins                    G Codes:          Vashti Bolanos 09/02/2016, 5:14 PM

## 2016-09-02 NOTE — Progress Notes (Signed)
ANTICOAGULATION CONSULT NOTE - Follow Up Consult  Pharmacy Consult for xarelto --> heparin Indication: atrial fibrillation  Allergies  Allergen Reactions  . Diltiazem Hcl     REACTION: Looked like burned all over  . Lisinopril     REACTION: Looked like burned all over  . Metoprolol Succinate     REACTION: Looked like burned all over  . Propafenone Hcl     REACTION: Looked like burned all over    Patient Measurements: Height: 5\' 11"  (180.3 cm) Weight: 214 lb 1.1 oz (97.1 kg) IBW/kg (Calculated) : 75.3 Heparin Dosing Weight: 93 kg  Vital Signs: Temp: 97.6 F (36.4 C) (07/07 1413) Temp Source: Oral (07/07 1413) BP: 118/51 (07/07 1413) Pulse Rate: 70 (07/07 1413)  Labs:  Recent Labs  08/30/16 1810  08/31/16 0300 08/31/16 0853 09/01/16 0303 09/02/16 0452  HGB  --   < > 9.9*  --  10.2* 9.4*  HCT  --   --  29.4*  --  30.2* 27.7*  PLT  --   --  189  --  240 234  HEPARINUNFRC 0.27*  --  0.36  --   --   --   CREATININE  --   --   --  1.41* 1.28* 1.45*  < > = values in this interval not displayed.  Estimated Creatinine Clearance: 46.7 mL/min (A) (by C-G formula based on SCr of 1.45 mg/dL (H)).  Assessment: 30 yoM admitted on 6/25 with rectal bleeding and PMH significant for afib on chronic Xarelto anticoagulation.  Xarelto was held for colonoscopy on 6/28 and Heparin IV was started on 6/29.  No active bleeding found, but incidentally found cecal mass that was suspicious for malignancy.  Surgery completed colectomy on 7/2. 7/3:  Heparin drip started post-op 7/5:  Transitioned back to xarelto 7/7:  Transitioned back to heparin drip at 0900 d/t ileus and NPO status (last xarelto dose was on 7/6 at 0925).   Today, 09/02/2016: - heparin drip started at 0900 this morning.  Patient loss IV access and drip was held for about 30 minutes (around noon). PICC placed this evening. - aPTT is therapeutic at 73, Heparin level is low at 0.24 - cbc low but relatively stable - no bleeding  documented  Goal of Therapy:  Heparin level 0.3-0.5 units/ml (using lower heparin level goal range d/t high bleeding risk) APTT 66-85 sec (corresponds to 0.3-0.5 Hep level) Monitor platelets by anticoagulation protocol: Yes   Plan:  - continue heparin drip at 1400 units/hr - recheck another aPTT and heparin level at  at 0200 on 7/8 to confirm  - monitor for s/s bleeding  Delois Tolbert P 09/02/2016,5:26 PM

## 2016-09-02 NOTE — Progress Notes (Addendum)
[place] NOTE   Pharmacy Consult for TPN Indication: prolonged ileus  Patient Measurements: Height: 5\' 11"  (180.3 cm) Weight: 214 lb 1.1 oz (97.1 kg) IBW/kg (Calculated) : 75.3 TPN AdjBW (KG): 92.6 Body mass index is 29.86 kg/m.  Assessment: 34 yoM admitted on 6/25 with rectal bleeding, s/p colonoscopy on 6/28 and colectomy on 7/2.  He now has prolonged ileus and Pharmacy is consulted to dose TPN.  Significant events: 7/6: not able to place PICC, TPN not started  Insulin Requirements: none from SSI  Current Nutrition: NPO  IVF: none  Central access:  PICC on 7/7 TPN start date: 7/7  Today:   Glucose (goal <150): no hx of DM; cbgs at goal  Electrolytes - K low at 3.4 (only got 1 dose of Kdur yesterday d/t NPO status);  Others WNL including CorrCa, phos and Mag.  Renal - SCr  Labile (up 1.45 with crcl~46)  LFTs - WNL (7/6)  TGs - 139 (7/7)  Prealbumin - 10.3 (7/7)  NUTRITIONAL GOALS                                                                                             RD recs (7/5):  Kcal:  2100-2300, Protein:  110-120g  Clinimix 5/20 at a goal rate of 83 ml/hr + 20% fat emulsion at 10 ml/hr to provide 100 g of protein and 2233 kCals per day meeting 91 % of protein and 100 % of kCal needs.   PLAN                                                                                                                         Now:  - potassium chloride 10 meq IV x4 runs  If able to place PICC, At 1800 today:  Start Clinimix E 5/20 at 40 ml/hr.  20% fat emulsion at 10 ml/hr.  Plan to advance as tolerated to the goal rate.  TPN to contain standard multivitamins daily and trace elements on MWF only  IVF per MD  continue sensitive SSI and CBGs q6h  TPN lab panels on Mondays & Thursdays.   F/u daily.  Adden (5:43PM): IV team was able to place PICC.  Will start TPN tonight.  Since pt didn't get TPN yesterday, will add  trace elements to today's TPN bag. - patient said he wants to try taking potassium solution by mouth instead of doing the runs.  Will change order to 40 meq x1 solution by mouth.  RN informed of plan for K+ replacement.  Dia Sitter, PharmD, BCPS 09/02/2016 9:17 AM

## 2016-09-02 NOTE — Progress Notes (Signed)
Subjective:  He had a reasonably good night but is still not able to take a lot of by mouth's.  Continues on intravenous medications.  Had some atrial fibrillation yesterday but is in sinus rhythm this morning.  Objective:  Vital Signs in the last 24 hours: BP (!) 107/51 (BP Location: Right Arm)   Pulse 77   Temp 98.1 F (36.7 C) (Oral)   Resp 18   Ht 5\' 11"  (1.803 m)   Wt 97.1 kg (214 lb 1.1 oz)   SpO2 97%   BMI 29.86 kg/m   Physical Exam: Elderly pleasant male in no acute distress Lungs:  Clear  Cardiac:  Regular rhythm, normal S1 and S2, no S3 Extremities:  No edema present  Intake/Output from previous day: 07/06 0701 - 07/07 0700 In: 1124.8 [I.V.:995.8; IV Piggyback:129] Out: 525 [Urine:525] Weight Filed Weights   08/26/16 0430 08/28/16 1700 09/01/16 0423  Weight: 92.6 kg (204 lb 3.2 oz) 92.6 kg (204 lb 2.3 oz) 97.1 kg (214 lb 1.1 oz)    Lab Results: Basic Metabolic Panel:  Recent Labs  09/01/16 0303 09/02/16 0452  NA 139 137  K 3.2* 3.4*  CL 110 107  CO2 22 25  GLUCOSE 151* 130*  BUN 12 13  CREATININE 1.28* 1.45*    CBC:  Recent Labs  09/01/16 0303 09/02/16 0452  WBC 8.3 9.1  NEUTROABS 6.0 6.5  HGB 10.2* 9.4*  HCT 30.2* 27.7*  MCV 88.8 88.5  PLT 240 234    Telemetry: Currently sinus rhythm personally reviewed.  He has first-degree AV block.  Had atrial fibrillation with controlled response yesterday.  Assessment/Plan:  1.  Paroxysmal atrial fibrillation currently in sinus rhythm with first-degree block 2.  Hypokalemia still needs replacement 3.  Recent abdominal surgery with ileus  Recommendations:  Continue his current medicines.  I suspect he'll continue to have some atrial fibrillation until we can get him back on his oral amiodarone.  Restart oral medications when they'll working better.  Continue diltiazem for now.     Kerry Hough  MD [facility] Card[facility]  09/02/2016, 8:53 AM

## 2016-09-02 NOTE — Progress Notes (Signed)
Report received from B.Shaw,RN. No change in assessment. Continue plan of care. Bradley Short

## 2016-09-02 NOTE — Progress Notes (Signed)
PROGRESS NOTE   Bradley Short  ZOX:[id number]    DOB: 05-Apr-1932    DOA: 08/21/2016  PCP: Manon Hilding, MD   I have briefly reviewed patients previous medical records in [place].  Brief Narrative:   84 PAF on Xarelto, HLD,  OSA on CPAP gout,  presented with multiple episodes of painless BRBPR, weakness, diaphoresis, went to [facility] ED where hemoglobin around 12 g, initially A. fib with RVR which improved with IV fluids   CT abdomen and pelvis showed sigmoid diverticulosis without acute findings,   transferred to [facility] stepdown unit. Hemodynamically and hemoglobin stable.   [facility] consulted and patient underwent colonoscopy 6/28.   Cecal lesion noted-Gen. surgery consulted  Cardiology provided preop clearance and made recommendations regarding A. fib management that patient developed overnight prior to surgery.  Assessment & Plan:   Principal Problem:   Acute GI bleeding Active Problems:   Pure hypercholesterolemia   ATRIAL FIBRILLATION   Acute blood loss anemia   Gout   Leukocytosis   Thrombocytopenia (HCC)   CKD (chronic kidney disease), stage III   Syncope, vasovagal   H/O colectomy  Acute blood loss anemia: Secondary to GI bleed. Stable. Acute GI bleed, suspect LGIB:   Cecal mass s/p R Colectomy 7/2-HIGH GRADE DYSPLASIA AND SMALL FOCUS  SUSPICIOUS FOR ADENOCARCINOMA. Postop ileus on x-ray 7/6  Preop CT abdomen without contrast 7/1: Sigmoid diverticulosis without acute  diverticulitis. No acute findings  [facility] follow-up, bleeding source likely diverticular in origin and has clinically  resolved.   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   Patient still distended and not appropriate for full diet as yet  Get abdominal x-ray to denote progress of resolution of ileus 7/8    Bipolar   On Clonopin ~ 3 yrs prior  Situational stressors of surgery and cancer  Increased ativan to  0.5 q6 prn-->changed to scheduled  Starting Buspar 5 bid--hold given ileus 7/6   Thrombocytopenia: Secondary to acute blood loss. Resolved. Leukocytosis: Likely stress response. Resolved.---> White count has gone from the 20s to 8 on 7/6  Syncope:   ? vasovagal due to acute GI bleed. Telemetry without significant arrhythmias as cause.  2-D echo: LVEF 60-65 percent. Moderate diastolic dysfunction. No significant valvular  abnormalities.  Mobilize. PT recommends no follow-up at this time. Paroxysmal atrial fibrillation with RVR-now back on Xarelto 7/4  Not on rate control medications PTA.   Cardiology indicated low to moderate risk for surgery. Developed A. fib with RVR in the  170s overnight 7/1.  Dr H Discussed with Dr. Johnsie Cancel - adding IV amiodarone given    Cardizem gtt started by cardiology 7/3--now at 5, Amio at 16.7   try Inderal 10 MG 3 times a day if needed for rate control. ?   Defer to cardiology    Hyperlipidemia: Resumed medications post colonoscopy. Gout: No acute flare. Continue allopurinol.  Hypophosphatemia/hypomagensiema:  Stage III chronic kidney disease:   Baseline creatinine not known. Creatinine stable in the 1.2-1.3 range.  Replaced.  ICU electrolyte protocol   DVT prophylaxis: SCDs. Code Status: Full Family Communication:  Disposition:   Consultants:  [facility]  Gen. Surgery Cardiology  Procedures:  Colonoscopy 08/24/16: Impression:               - Preparation of the colon was fair.                           -  One 7 mm polyp in the cecum, removed with a hot                            snare. Complete resection. Polyp tissue not                            retrieved.                           - Likely malignant tumor in the cecum and at the                            appendiceal orifice. Biopsied. Tattooed.                           - Diverticulosis in the sigmoid colon, in the                            descending colon and at the splenic flexure.                            - Non-bleeding external hemorrhoids.                           - The examination was otherwise normal.  Underwent R lap assist Hemicolectomy 7/2-Dr. Marlou Starks  Pathology results:  Diagnosis Ileocecal valve, biopsy, mass - SESSILE SERRATED POLYP WITH HIGH GRADE DYSPLASIA AND SMALL FOCUS SUSPICIOUS FOR ADENOCARCINOMA.  Antimicrobials:  None    Subjective:  Alert pleasant oriented Has not eaten since yesterday States he is feeling weak and tired and a little bit anxious Has not passed gas nor stool No fever no chills Wife at bedside   Objective:  Vitals:   09/01/16 1300 09/01/16 1440 09/01/16 2044 09/02/16 0621  BP: (!) 117/57 (!) 114/53 (!) 110/54 (!) 107/51  Pulse:  80 87 77  Resp: (!) 22 (!) 24 20 18   Temp: 98.6 F (37 C) 99.3 F (37.4 C) 98.5 F (36.9 C) 98.1 F (36.7 C)  TempSrc: Oral Oral Oral Oral  SpO2:  96% 96% 97%  Weight:      Height:        Examination:  Alert pleasant No new issues Chest is clear without wheeze No added sound abd shows patch.  A little bit more distended than prior No rebound No peritoneal sign  Neuro intact  Data Reviewed: I have personally reviewed following labs and imaging studies  CBC:  Recent Labs Lab 08/29/16 0323 08/30/16 0256 08/31/16 0300 09/01/16 0303 09/02/16 0452  WBC 24.9* 19.1* 18.7* 8.3 9.1  NEUTROABS  --   --   --  6.0 6.5  HGB 10.7* 9.7* 9.9* 10.2* 9.4*  HCT 32.8* 29.0* 29.4* 30.2* 27.7*  MCV 89.4 90.1 89.6 88.8 88.5  PLT 193 166 189 240 235   Basic Metabolic Panel:  Recent Labs Lab 08/29/16 0323 08/30/16 0256 08/30/16 0300 08/31/16 0853 09/01/16 0303 09/02/16 0452  NA 138 137  --  138 139 137  K 4.0 3.8  --  3.6 3.2* 3.4*  CL 107 108  --  107 110 107  CO2 25 23  --  24 22 25   GLUCOSE  176* 158*  --  141* 151* 130*  BUN 13 15  --  14 12 13   CREATININE 1.48* 1.42*  --  1.41* 1.28* 1.45*  CALCIUM 8.2* 8.1*  --  8.3* 8.0* 8.0*  MG  --   --  2.1  --  2.2 2.3  PHOS  --   --   2.2*  --   --  2.8   Liver Function Tests:  Recent Labs Lab 09/01/16 0303 09/02/16 0452  AST 21 34  ALT 19 30  ALKPHOS 58 69  BILITOT 0.6 0.7  PROT 6.3* 5.9*  ALBUMIN 2.7* 2.7*   Coagulation Profile: No results for input(s): INR, PROTIME in the last 168 hours. CBG:  Recent Labs Lab 09/01/16 1823 09/01/16 2329 09/02/16 0617  GLUCAP 113* 132* 119*    Radiology Studies: Dg Chest 2 View  Result Date: 09/01/2016 CLINICAL DATA:  Images obtained for PNA  pneumonia EXAM: CHEST - 2 VIEW COMPARISON:  08/27/2016 FINDINGS: Relatively low lung volumes. Some patchy subsegmental atelectasis versus early interstitial infiltrate at the left lung base and in the right infrahilar region. No confluent airspace consolidation. Heart size and mediastinal contours are within normal limits. Atheromatous aorta. No effusion.  No pneumothorax. Anterior vertebral endplate spurring at multiple levels in the mid and lower thoracic spine. IMPRESSION: 1. Low volumes with some patchy subsegmental atelectasis versus early interstitial infiltrate at the left base and right infrahilar region. Electronically Signed   By: Lucrezia Europe M.D.   On: 09/01/2016 08:41   Dg Abd 2 Views  Result Date: 09/01/2016 CLINICAL DATA:  Ileus following gastrointestinal surgery EXAM: ABDOMEN - 2 VIEW COMPARISON:  CT abdomen and pelvis 08/27/2016 FINDINGS: Gas within stomach. Numerous air-filled loops of small bowel throughout abdomen. Scattered colonic gas. Findings are most consistent with a postoperative ileus. No definite bowel wall thickening or free air. Degenerative changes lumbar spine. Diffuse osseous demineralization. Skin clips at midline in mid abdomen. IMPRESSION: Postoperative ileus. Electronically Signed   By: Lavonia Dana M.D.   On: 09/01/2016 15:47    Current Facility-Administered Medications:  .  [COMPLETED] amiodarone (NEXTERONE) 1.8 mg/mL load via infusion 150 mg, 150 mg, Intravenous, Once, 150 mg at 08/28/16 1000  **FOLLOWED BY** [EXPIRED] amiodarone (NEXTERONE PREMIX) 360-4.14 MG/200ML-% (1.8 mg/mL) IV infusion, 60 mg/hr, Intravenous, Continuous, Stopped at 08/28/16 1605 **FOLLOWED BY** amiodarone (NEXTERONE PREMIX) 360-4.14 MG/200ML-% (1.8 mg/mL) IV infusion, 30 mg/hr, Intravenous, Continuous, Nishan, Peter C, MD, Last Rate: 16.7 mL/hr at 09/02/16 1007, 30 mg/hr at 09/02/16 1007 .  dextrose 5 %-0.45 % sodium chloride infusion, , Intravenous, Continuous, Green, Terri L, RPH, Last Rate: 50 mL/hr at 09/01/16 1800 .  diltiazem (CARDIZEM) 100 mg in dextrose 5% 115mL (1 mg/mL) infusion, 3 mg/hr, Intravenous, Titrated, Daune Perch, NP, Last Rate: 3 mL/hr at 09/01/16 1402, 3 mg/hr at 09/01/16 1402 .  fentaNYL (SUBLIMAZE) injection 25-50 mcg, 25-50 mcg, Intravenous, Q3H PRN, Schorr, Rhetta Mura, NP, 25 mcg at 08/29/16 0726 .  heparin ADULT infusion 100 units/mL (25000 units/263mL sodium chloride 0.45%), 1,400 Units/hr, Intravenous, Continuous, Minda Ditto, RPH, Last Rate: 14 mL/hr at 09/02/16 1234, 1,400 Units/hr at 09/02/16 1234 .  insulin aspart (novoLOG) injection 0-9 Units, 0-9 Units, Subcutaneous, Q6H, Shade, Christine E, RPH .  LORazepam (ATIVAN) 2 MG/ML concentrated solution 0.5 mg, 0.5 mg, Oral, Q6H PRN, Nita Sells, MD, 0.5 mg at 09/01/16 2310 .  metoCLOPramide (REGLAN) injection 5 mg, 5 mg, Intravenous, Q8H, Autumn Messing III, MD, 5 mg at 09/02/16 0650 .  metoCLOPramide (REGLAN) injection 5 mg, 5 mg, Intravenous, Q8H, Meli Faley, Jai-Gurmukh, MD .  morphine 4 MG/ML injection 1-2 mg, 1-2 mg, Intravenous, Q2H PRN, Simaan, Elizabeth S, PA-C .  [DISCONTINUED] ondansetron (ZOFRAN) tablet 4 mg, 4 mg, Oral, Q6H PRN **OR** ondansetron (ZOFRAN) injection 4 mg, 4 mg, Intravenous, Q6H PRN, Rise Patience, MD, 4 mg at 08/31/16 2356 .  potassium chloride 10 mEq in 100 mL IVPB, 10 mEq, Intravenous, Q1 Hr x 4, Pham, Anh P, RPH .  TPN (CLINIMIX-E) Adult, , Intravenous, Continuous TPN, Stopped at 09/01/16 1620  **AND** [EXPIRED] fat emulsion 20 % infusion 250 mL, 250 mL, Intravenous, Cyclic-See Admin Instructions, Randa Spike, RPH, Stopped at 09/01/16 1621     LOS: 12 days    Verneita Griffes, MD [facility] [phone]   09/02/2016, 1:29 PM

## 2016-09-02 NOTE — Progress Notes (Signed)
[facility]  Assessment & Plan: Acute lower GIB S/P LAPAROSCOPIC ASSISTED RIGHT COLECTOMY 08/28/16 Dr. Autumn Messing III - POD#5  surgical pathology: SESSILE SERRATED ADENOMA WITH HIGH GRADE DYSPLASIA, 21 BENIGN LYMPH  NODES, MARGINS UNINVOLVED BY NEOPLASIA, DIVERTICULAR DISEASE, APPENDIX WITH NO DIAGNOSTIC  ABNORMALITY, SMALL BOWEL WITH NO DIAGNOSTIC ABNORMALITY   Post op ileus - some signs of returning function today   Begin clear liquid diet   Apparently to start TNA today after PICC line placed    Encouraged OOB, ambulation in halls  Paroxysmal atrial fibrillation  restarted Xarelto. On amiodarone gtt. Cards following.          Earnstine Regal, MD, [facility]       Office: [phone]    Chief Complaint: Wants to eat  Subjective: Patient in bed, comfortable.  Denies flatus or BM.  Wife and nurse at bedside.  Objective: Vital signs in last 24 hours: Temp:  [98.1 F (36.7 C)-99.3 F (37.4 C)] 98.1 F (36.7 C) (07/07 0621) Pulse Rate:  [77-87] 77 (07/07 0621) Resp:  [18-24] 18 (07/07 0621) BP: (97-138)/(49-68) 107/51 (07/07 0621) SpO2:  [96 %-97 %] 97 % (07/07 0621) Last BM Date: 08/29/16  Intake/Output from previous day: 07/06 0701 - 07/07 0700 In: 1124.8 [I.V.:995.8; IV Piggyback:129] Out: 525 [Urine:525] Intake/Output this shift: No intake/output data recorded.  Physical Exam: HEENT - sclerae clear, mucous membranes moist Neck - soft Chest - clear bilaterally Cor - rate controlled Abdomen - soft, mild distension; BS present; wounds dry and intact; mild tenderness RLQ Ext - no edema, non-tender Neuro - alert & oriented, no focal deficits  Lab Results:   Recent Labs  09/01/16 0303 09/02/16 0452  WBC 8.3 9.1  HGB 10.2* 9.4*  HCT 30.2* 27.7*  PLT 240 234   BMET  Recent Labs  09/01/16 0303 09/02/16 0452  NA 139 137  K 3.2* 3.4*  CL 110 107  CO2 22 25  GLUCOSE 151* 130*   BUN 12 13  CREATININE 1.28* 1.45*  CALCIUM 8.0* 8.0*   PT/INR No results for input(s): LABPROT, INR in the last 72 hours. Comprehensive Metabolic Panel:    Component Value Date/Time   NA 137 09/02/2016 0452   NA 139 09/01/2016 0303   K 3.4 (L) 09/02/2016 0452   K 3.2 (L) 09/01/2016 0303   CL 107 09/02/2016 0452   CL 110 09/01/2016 0303   CO2 25 09/02/2016 0452   CO2 22 09/01/2016 0303   BUN 13 09/02/2016 0452   BUN 12 09/01/2016 0303   CREATININE 1.45 (H) 09/02/2016 0452   CREATININE 1.28 (H) 09/01/2016 0303   GLUCOSE 130 (H) 09/02/2016 0452   GLUCOSE 151 (H) 09/01/2016 0303   CALCIUM 8.0 (L) 09/02/2016 0452   CALCIUM 8.0 (L) 09/01/2016 0303   AST 34 09/02/2016 0452   AST 21 09/01/2016 0303   ALT 30 09/02/2016 0452   ALT 19 09/01/2016 0303   ALKPHOS 69 09/02/2016 0452   ALKPHOS 58 09/01/2016 0303   BILITOT 0.7 09/02/2016 0452   BILITOT 0.6 09/01/2016 0303   PROT 5.9 (L) 09/02/2016 0452   PROT 6.3 (L) 09/01/2016 0303   ALBUMIN 2.7 (L) 09/02/2016 0452   ALBUMIN 2.7 (L) 09/01/2016 0303    Studies/Results: Dg Chest 2 View  Result Date: 09/01/2016 CLINICAL DATA:  Images obtained for PNA  pneumonia EXAM: CHEST - 2 VIEW COMPARISON:  08/27/2016 FINDINGS: Relatively low lung  volumes. Some patchy subsegmental atelectasis versus early interstitial infiltrate at the left lung base and in the right infrahilar region. No confluent airspace consolidation. Heart size and mediastinal contours are within normal limits. Atheromatous aorta. No effusion.  No pneumothorax. Anterior vertebral endplate spurring at multiple levels in the mid and lower thoracic spine. IMPRESSION: 1. Low volumes with some patchy subsegmental atelectasis versus early interstitial infiltrate at the left base and right infrahilar region. Electronically Signed   By: Lucrezia Europe M.D.   On: 09/01/2016 08:41   Dg Abd 2 Views  Result Date: 09/01/2016 CLINICAL DATA:  Ileus following gastrointestinal surgery EXAM: ABDOMEN -  2 VIEW COMPARISON:  CT abdomen and pelvis 08/27/2016 FINDINGS: Gas within stomach. Numerous air-filled loops of small bowel throughout abdomen. Scattered colonic gas. Findings are most consistent with a postoperative ileus. No definite bowel wall thickening or free air. Degenerative changes lumbar spine. Diffuse osseous demineralization. Skin clips at midline in mid abdomen. IMPRESSION: Postoperative ileus. Electronically Signed   By: Lavonia Dana M.D.   On: 09/01/2016 15:47      Sakiyah Shur M 09/02/2016  Patient ID: [id number] [address], male   DOB: 11/12/1932, 83 y.o.   MRN: [id number]

## 2016-09-03 ENCOUNTER — Inpatient Hospital Stay (HOSPITAL_COMMUNITY): Payer: Medicare Other

## 2016-09-03 LAB — APTT: APTT: 85 s — AB (ref 24–36)

## 2016-09-03 LAB — CBC
HEMATOCRIT: 27.5 % — AB (ref 39.0–52.0)
HEMOGLOBIN: 9.2 g/dL — AB (ref 13.0–17.0)
MCH: 29.6 pg (ref 26.0–34.0)
MCHC: 33.5 g/dL (ref 30.0–36.0)
MCV: 88.4 fL (ref 78.0–100.0)
Platelets: 243 10*3/uL (ref 150–400)
RBC: 3.11 MIL/uL — ABNORMAL LOW (ref 4.22–5.81)
RDW: 15.7 % — AB (ref 11.5–15.5)
WBC: 11.5 10*3/uL — AB (ref 4.0–10.5)

## 2016-09-03 LAB — HEPARIN LEVEL (UNFRACTIONATED)
Heparin Unfractionated: 0.17 IU/mL — ABNORMAL LOW (ref 0.30–0.70)
Heparin Unfractionated: 0.27 IU/mL — ABNORMAL LOW (ref 0.30–0.70)
Heparin Unfractionated: 0.26 IU/mL — ABNORMAL LOW (ref 0.30–0.70)

## 2016-09-03 LAB — GLUCOSE, CAPILLARY
GLUCOSE-CAPILLARY: 122 mg/dL — AB (ref 65–99)
GLUCOSE-CAPILLARY: 142 mg/dL — AB (ref 65–99)
Glucose-Capillary: 143 mg/dL — ABNORMAL HIGH (ref 65–99)
Glucose-Capillary: 144 mg/dL — ABNORMAL HIGH (ref 65–99)

## 2016-09-03 MED ORDER — FAT EMULSION 20 % IV EMUL
240.0000 mL | INTRAVENOUS | Status: AC
  Administered 2016-09-03: 240 mL via INTRAVENOUS
  Filled 2016-09-03: qty 250

## 2016-09-03 MED ORDER — LORAZEPAM 2 MG/ML PO CONC
0.5000 mg | Freq: Four times a day (QID) | ORAL | Status: DC | PRN
  Administered 2016-09-03 – 2016-09-07 (×5): 1 mg via ORAL
  Administered 2016-09-08 – 2016-09-10 (×3): 0.5 mg via ORAL
  Filled 2016-09-03 (×8): qty 1

## 2016-09-03 MED ORDER — AMIODARONE HCL 200 MG PO TABS
200.0000 mg | ORAL_TABLET | Freq: Two times a day (BID) | ORAL | Status: AC
  Administered 2016-09-03 – 2016-09-05 (×6): 200 mg via ORAL
  Filled 2016-09-03 (×6): qty 1

## 2016-09-03 MED ORDER — M.V.I. ADULT IV INJ
INTRAVENOUS | Status: AC
  Administered 2016-09-03: 17:00:00 via INTRAVENOUS
  Filled 2016-09-03: qty 1440

## 2016-09-03 MED ORDER — CALAMINE EX LOTN
TOPICAL_LOTION | Freq: Two times a day (BID) | CUTANEOUS | Status: DC
  Administered 2016-09-03 – 2016-09-06 (×7): via TOPICAL
  Administered 2016-09-07: 1 via TOPICAL
  Administered 2016-09-07 – 2016-09-11 (×7): via TOPICAL
  Filled 2016-09-03 (×2): qty 118

## 2016-09-03 MED ORDER — HEPARIN (PORCINE) IN NACL 100-0.45 UNIT/ML-% IJ SOLN
1750.0000 [IU]/h | INTRAMUSCULAR | Status: DC
  Administered 2016-09-03: 1650 [IU]/h via INTRAVENOUS
  Administered 2016-09-04 – 2016-09-05 (×2): 1750 [IU]/h via INTRAVENOUS
  Filled 2016-09-03 (×3): qty 250

## 2016-09-03 NOTE — Progress Notes (Signed)
[place] NOTE   Pharmacy Consult for TPN Indication: prolonged ileus  Patient Measurements: Height: 5\' 11"  (180.3 cm) Weight: 214 lb 1.1 oz (97.1 kg) IBW/kg (Calculated) : 75.3 TPN AdjBW (KG): 92.6 Body mass index is 29.86 kg/m.  Assessment: 3 yoM admitted on 6/25 with rectal bleeding, s/p colonoscopy on 6/28 and colectomy on 7/2.  He now has prolonged ileus and Pharmacy is consulted to dose TPN.  Significant events: 7/6: not able to place PICC, TPN not started  Insulin Requirements: 1 unit from SSI since TPN started on 7/7  Current Nutrition: NPO  IVF: D5 0.45NS at 50 ml/hr  Central access:  PICC on 7/7 TPN start date: 7/7  Today:   Glucose (goal <150): no hx of DM; cbgs at goal  Electrolytes - K repleted yesterday. Others WNL including CorrCa, phos and Mag with labs on 7/7  Renal - SCr  Labile (up 1.45 with crcl~46)  LFTs - WNL (7/6)  TGs - 139 (7/7)  Prealbumin - 10.3 (7/7)  NUTRITIONAL GOALS                                                                                             RD recs (7/5):  Kcal:  2100-2300, Protein:  110-120g  Clinimix 5/20 at a goal rate of 83 ml/hr + 20% fat emulsion at 10 ml/hr to provide 100 g of protein and 2233 kCals per day meeting 91 % of protein and 100 % of kCal needs.   PLAN                                                                                                                         At 1800 today:  Increase Clinimix E 5/20 to 60 ml/hr.  20% fat emulsion at 20 ml/h over 12 hrs  Plan to advance as tolerated to the goal rate.  TPN to contain standard multivitamins daily and trace elements on MWF only  IVF per MD  continue sensitive SSI and CBGs q6h  TPN lab panels on Mondays & Thursdays.   F/u daily.   Dia Sitter, PharmD, BCPS 09/03/2016 8:17 AM

## 2016-09-03 NOTE — Progress Notes (Signed)
6 Days Post-Op   Subjective/Chief Complaint: Feels bette this morning Passing Short lot of flatus Tolerated clears   Objective: Vital signs in last 24 hours: Temp:  [97.6 F (36.4 C)-98.5 F (36.9 C)] 98.1 F (36.7 C) (07/08 0558) Pulse Rate:  [70-79] 72 (07/08 0558) Resp:  [18] 18 (07/08 0558) BP: (118-144)/(51-57) 137/57 (07/08 0558) SpO2:  [96 %-98 %] 98 % (07/08 0558) Last BM Date: 08/29/16  Intake/Output from previous day: 07/07 0701 - 07/08 0700 In: 2190.5 [P.O.:240; I.V.:1950.5] Out: 535 [Urine:535] Intake/Output this shift: No intake/output data recorded.  Exam: Awake and alert Sitting on the toilet Abdomen still protuberant but has BS  Lab Results:   Recent Labs  09/02/16 0452 09/03/16 0205  WBC 9.1 11.5*  HGB 9.4* 9.2*  HCT 27.7* 27.5*  PLT 234 243   BMET  Recent Labs  09/01/16 0303 09/02/16 0452  NA 139 137  K 3.2* 3.4*  CL 110 107  CO2 22 25  GLUCOSE 151* 130*  BUN 12 13  CREATININE 1.28* 1.45*  CALCIUM 8.0* 8.0*   PT/INR No results for input(s): LABPROT, INR in the last 72 hours. ABG No results for input(s): PHART, HCO3 in the last 72 hours.  Invalid input(s): PCO2, PO2  Studies/Results: Dg Chest 2 View  Result Date: 09/01/2016 CLINICAL DATA:  Images obtained for PNA  pneumonia EXAM: CHEST - 2 VIEW COMPARISON:  08/27/2016 FINDINGS: Relatively low lung volumes. Some patchy subsegmental atelectasis versus early interstitial infiltrate at the left lung base and in the right infrahilar region. No confluent airspace consolidation. Heart size and mediastinal contours are within normal limits. Atheromatous aorta. No effusion.  No pneumothorax. Anterior vertebral endplate spurring at multiple levels in the mid and lower thoracic spine. IMPRESSION: 1. Low volumes with some patchy subsegmental atelectasis versus early interstitial infiltrate at the left base and right infrahilar region. Electronically Signed   By: Lucrezia Europe M.D.   On: 09/01/2016  08:41   Dg Chest Port 1 View  Result Date: 09/02/2016 CLINICAL DATA:  PICC line placement EXAM: PORTABLE CHEST 1 VIEW COMPARISON:  09/01/2016 FINDINGS: 1643 hours. Low volume film with basilar atelectasis. No overt edema. The cardio pericardial silhouette is enlarged. Right PICC line is new in the interval with the tip positioned over the mid SVC level. Telemetry leads overlie the chest. IMPRESSION: Right PICC line tip overlies the mid SVC level. Electronically Signed   By: Misty Stanley M.D.   On: 09/02/2016 17:20   Dg Abd 2 Views  Result Date: 09/01/2016 CLINICAL DATA:  Ileus following gastrointestinal surgery EXAM: ABDOMEN - 2 VIEW COMPARISON:  CT abdomen and pelvis 08/27/2016 FINDINGS: Gas within stomach. Numerous air-filled loops of small bowel throughout abdomen. Scattered colonic gas. Findings are most consistent with Short postoperative ileus. No definite bowel wall thickening or free air. Degenerative changes lumbar spine. Diffuse osseous demineralization. Skin clips at midline in mid abdomen. IMPRESSION: Postoperative ileus. Electronically Signed   By: Lavonia Dana M.D.   On: 09/01/2016 15:47    Anti-infectives: Anti-infectives    Start     Dose/Rate Route Frequency Ordered Stop   08/28/16 1133  cefoTEtan in Dextrose 5% (CEFOTAN) 2-2.08 GM-% IVPB    Comments:  [name], Bella Kennedy   : cabinet override      08/28/16 1133 08/28/16 1145   08/28/16 0945  cefoTEtan (CEFOTAN) 2 g in dextrose 5 % 50 mL IVPB     2 g 100 mL/hr over 30 Minutes Intravenous On call to O.R. 08/28/16  [id number] 08/28/16 1710      Assessment/Plan: s/p Procedure(s): LAPAROSCOPIC ASSISTED RIGHT COLECTOMY (Right)   Ileus slowly resolving. Will try full liquids. WBC up today.  If it continues to increase, would need CT to r/o abscess  LOS: 13 days    Bradley Short 09/03/2016

## 2016-09-03 NOTE — Clinical Social Work Note (Signed)
Clinical Social Work Assessment  Patient Details  Name: Bradley Short MRN: [id number] Date of Birth: 02-15-1933  Date of referral:  09/03/16               Reason for consult:  Discharge Planning                Permission sought to share information with:  Family Supports Permission granted to share information::  Yes, Verbal Permission Granted  Name::     wife Myriam [phone]  Agency::     Relationship::     Contact Information:     Housing/Transportation Living arrangements for the past 2 months:  Single Family Home Source of Information:  Patient, Spouse Patient Interpreter Needed:  None Criminal Activity/Legal Involvement Pertinent to Current Situation/Hospitalization:  No - Comment as needed Significant Relationships:  Adult Children, Other Family Members, Spouse Lives with:  Spouse Do you feel safe going back to the place where you live?  Yes Need for family participation in patient care:  No (Coment)  Care giving concerns:  Pt from home where he resides with wife. Prior to hospitalization was largely independent with mobility per pt and wife. States he and wife have equipment at home such as walker and bedside commode.  Pt and wife hope to have pt return home at DC but are undecided. State pt's son and daughter-in-aw live nearby and can help in the evenings, and their other daughter is making arrangements to come to stay with pt for 2 weeks beginning 1 week from today. Their concern is that wife will not be able to manage pt in the week leading up to daughter arriving if pt DCs before next Sunday.    Social Worker assessment / plan:  CSW consulted for potential SNF placement.  Met with pt and wife at bedside. See above, pt undecided about going to SNF vs wanting to go home with help of family and Home Health. State they are hoping pt will progress somewhat with mobility prior to DC and would prefer to go home.  Agreed to have CSW refer to SNF so that pt can have options if  decides on SNF. Would prefer [facility] or [facility].  Obtained PASSR and FL2. Made referrals.  Plan: TBD- pt deciding on SNF vs. Home with HHPT and help of family. Will follow up.   Employment status:  Retired Forensic scientist:  [employer] PT Recommendations:  [place] / Referral to community resources:  [place]  Patient/Family's Response to care:  Pt and wife very engaged and appreciative of care  Patient/Family's Understanding of and Emotional Response to Diagnosis, Current Treatment, and Prognosis:  Both pt and wife demonstrate adequate understanding of potential plan and barriers.   Emotional Assessment Appearance:  Appears stated age Attitude/Demeanor/Rapport:   (pleasant) Affect (typically observed):  Calm Orientation:  Oriented to Self, Oriented to Place, Oriented to  Time, Oriented to Situation Alcohol / Substance use:    Psych involvement (Current and /or in the community):  No (Comment)  Discharge Needs  Concerns to be addressed:  Discharge Planning Concerns Readmission within the last 30 days:  No Current discharge risk:  Dependent with Mobility Barriers to Discharge:  Continued Medical Work up   [employer], LCSW 09/03/2016, 6:53 PM  [phone]

## 2016-09-03 NOTE — Progress Notes (Addendum)
Subjective:  Tolerating liquid diet well.  Able to have BM.  Appears to be in sinus overnight.   Objective:  Vital Signs in the last 24 hours: BP (!) 137/57 (BP Location: Left Arm)   Pulse 72   Temp 98.1 F (36.7 C) (Oral)   Resp 18   Ht 5\' 11"  (1.803 m)   Wt 97.1 kg (214 lb 1.1 oz)   SpO2 98%   BMI 29.86 kg/m   Physical Exam: Elderly pleasant male in no acute distress sitting in chair Lungs:  Clear  Cardiac:  Regular rhythm, normal S1 and S2, no S3 Extremities:  No edema present  Intake/Output from previous day: 07/07 0701 - 07/08 0700 In: 2190.5 [P.O.:240; I.V.:1950.5] Out: 535 [Urine:535] Weight Filed Weights   08/26/16 0430 08/28/16 1700 09/01/16 0423  Weight: 92.6 kg (204 lb 3.2 oz) 92.6 kg (204 lb 2.3 oz) 97.1 kg (214 lb 1.1 oz)    Lab Results: Basic Metabolic Panel:  Recent Labs  09/01/16 0303 09/02/16 0452  NA 139 137  K 3.2* 3.4*  CL 110 107  CO2 22 25  GLUCOSE 151* 130*  BUN 12 13  CREATININE 1.28* 1.45*    CBC:  Recent Labs  09/01/16 0303 09/02/16 0452 09/03/16 0205  WBC 8.3 9.1 11.5*  NEUTROABS 6.0 6.5  --   HGB 10.2* 9.4* 9.2*  HCT 30.2* 27.7* 27.5*  MCV 88.8 88.5 88.4  PLT 240 234 243    Telemetry: Currently sinus rhythm personally reviewed.  He has first-degree AV block.    Assessment/Plan:  1.  Paroxysmal atrial fibrillation currently in sinus rhythm with first-degree block 2.  Hypokalemia still needs replacement 3.  Recent abdominal surgery with ileus appears improved  Recommendations:  Trial back on oral meds.  Restart anticoagulation when OK with surgery . Prior to admit not on rhythm control meds.  Would use amiodarone in the short term for now.     Kerry Hough  MD [facility]  09/03/2016, 9:01 AM

## 2016-09-03 NOTE — Progress Notes (Signed)
PROGRESS NOTE   Bradley Short  LOV:[id number]    DOB: 07-13-1932    DOA: 08/21/2016  PCP: Manon Hilding, MD   I have briefly reviewed patients previous medical records in [place].  Brief Narrative:   84 PAF on Xarelto, HLD,  OSA on CPAP gout,  presented with multiple episodes of painless BRBPR, weakness, diaphoresis, went to [facility] ED where hemoglobin around 12 g, initially A. fib with RVR which improved with IV fluids   CT abdomen and pelvis showed sigmoid diverticulosis without acute findings,   transferred to [facility] stepdown unit. Hemodynamically and hemoglobin stable.   [facility] consulted and patient underwent colonoscopy 6/28.   Cecal lesion noted-Gen. surgery consulted  Cardiology provided preop clearance and made recommendations regarding A. fib management that patient developed overnight prior to surgery.  Assessment & Plan:   Principal Problem:   Acute GI bleeding Active Problems:   Pure hypercholesterolemia   ATRIAL FIBRILLATION   Acute blood loss anemia   Gout   Leukocytosis   Thrombocytopenia (HCC)   CKD (chronic kidney disease), stage III   Syncope, vasovagal   H/O colectomy  Acute blood loss anemia: Secondary to GI bleed. Stable. Acute GI bleed, suspect LGIB:   Cecal mass s/p R Colectomy 7/2-HIGH GRADE DYSPLASIA AND SMALL FOCUS  SUSPICIOUS FOR ADENOCARCINOMA. Postop ileus on x-ray 7/6  Preop CT abdomen without contrast 7/1: Sigmoid diverticulosis without acute  diverticulitis. No acute findings  [facility] follow-up, bleeding source likely diverticular in origin and has clinically  resolved.   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   Abdominal x-ray does not currently changed resolution of ileus 7/8    Bipolar   On Clonopin ~ 3 yrs prior  Situational stressors of surgery and cancer  Increased ativan to 0.5-->1 q6 scheduled 7/8 as anxious still  Starting Buspar 5  bid--hold given ileus 7/6   Thrombocytopenia: Secondary to acute blood loss. Resolved. Leukocytosis: Likely stress response. Resolved.---> White count has gone from the 20s to 8 on 7/6-->11.5  Syncope:   ? vasovagal due to acute GI bleed. Telemetry without significant arrhythmias as cause.  2-D echo: LVEF 60-65 percent. Moderate diastolic dysfunction. No significant valvular  abnormalities.  Mobilize. PT recommends no follow-up at this time. Paroxysmal atrial fibrillation with RVR-now back on Xarelto 7/4  back to sinus rhythm 7/8 while on antiarrhythmic  Not on rate control medications PTA.   Cardiology indicated low to moderate risk for surgery. Developed A. fib with RVR in the  170s overnight 7/1.  Dr H Discussed with Dr. Johnsie Cancel - adding IV amiodarone given    Cardizem gtt started by cardiology 7/3--now at 5, Amio at 16.7   Continue meds as above until tolerating by mouth reasonable  Defer to cardiology    Hyperlipidemia: Resumed medications post colonoscopy. Gout: No acute flare. Continue allopurinol.  Hypophosphatemia/hypomagensiema:  Stage III chronic kidney disease:   Baseline creatinine not known. Creatinine stable in the 1.2-1.3 range.  Replaced.  ICU electrolyte protocol   DVT prophylaxis: SCDs. Code Status: Full Family Communication:  Disposition:   Consultants:  [facility]  Gen. Surgery Cardiology  Procedures:  Colonoscopy 08/24/16: Impression:               - Preparation of the colon was fair.                           - One 7  mm polyp in the cecum, removed with a hot                            snare. Complete resection. Polyp tissue not                            retrieved.                           - Likely malignant tumor in the cecum and at the                            appendiceal orifice. Biopsied. Tattooed.                           - Diverticulosis in the sigmoid colon, in the                            descending colon and at the splenic flexure.                            - Non-bleeding external hemorrhoids.                           - The examination was otherwise normal.  Underwent R lap assist Hemicolectomy 7/2-Dr. Marlou Starks  Pathology results:  Diagnosis Ileocecal valve, biopsy, mass - SESSILE SERRATED POLYP WITH HIGH GRADE DYSPLASIA AND SMALL FOCUS SUSPICIOUS FOR ADENOCARCINOMA.  Antimicrobials:  None    Subjective:  happier looks more rested and eating and drinking Tolerating some of her diet and has been graduated to full liquids by general surgery 7/8 Passing lots of gas so far not stool Abdominal x-ray shows persisting hearing levels however-patient overall feels better  Objective:  Vitals:   09/02/16 0621 09/02/16 1413 09/02/16 2038 09/03/16 0558  BP: (!) 107/51 (!) 118/51 (!) 144/55 (!) 137/57  Pulse: 77 70 79 72  Resp: 18 18  18   Temp: 98.1 F (36.7 C) 97.6 F (36.4 C) 98.5 F (36.9 C) 98.1 F (36.7 C)  TempSrc: Oral Oral Oral Oral  SpO2: 97% 98% 96% 98%  Weight:      Height:        Examination:  (no pallor no gross neuro.jprog Swelling No JVD, no bruit Chest is clinically clear S1 and S2 no murmur rub or gallop seems to be rhythm Abdomen is still distended but not and nontender Mild Extremity edema   Data Reviewed: I have personally reviewed following labs and imaging studies  CBC:  Recent Labs Lab 08/30/16 0256 08/31/16 0300 09/01/16 0303 09/02/16 0452 09/03/16 0205  WBC 19.1* 18.7* 8.3 9.1 11.5*  NEUTROABS  --   --  6.0 6.5  --   HGB 9.7* 9.9* 10.2* 9.4* 9.2*  HCT 29.0* 29.4* 30.2* 27.7* 27.5*  MCV 90.1 89.6 88.8 88.5 88.4  PLT 166 189 240 234 001   Basic Metabolic Panel:  Recent Labs Lab 08/29/16 0323 08/30/16 0256 08/30/16 0300 08/31/16 0853 09/01/16 0303 09/02/16 0452  NA 138 137  --  138 139 137  K 4.0 3.8  --  3.6 3.2* 3.4*  CL 107 108  --  107 110 107  CO2  25 23  --  24 22 25   GLUCOSE 176* 158*  --  141* 151* 130*  BUN 13 15  --  14 12 13   CREATININE 1.48* 1.42*  --   1.41* 1.28* 1.45*  CALCIUM 8.2* 8.1*  --  8.3* 8.0* 8.0*  MG  --   --  2.1  --  2.2 2.3  PHOS  --   --  2.2*  --   --  2.8   Liver Function Tests:  Recent Labs Lab 09/01/16 0303 09/02/16 0452  AST 21 34  ALT 19 30  ALKPHOS 58 69  BILITOT 0.6 0.7  PROT 6.3* 5.9*  ALBUMIN 2.7* 2.7*   Coagulation Profile: No results for input(s): INR, PROTIME in the last 168 hours. CBG:  Recent Labs Lab 09/02/16 1408 09/02/16 1830 09/03/16 0003 09/03/16 0557 09/03/16 1158  GLUCAP 114* 114* 137* 144* 122*    Radiology Studies:  reviewed up to date current imaging for today    LOS: 13 days    Verneita Griffes, MD [facility] [phone]   09/03/2016, 3:37 PM

## 2016-09-03 NOTE — Progress Notes (Signed)
ANTICOAGULATION CONSULT NOTE - Follow Up Consult  Pharmacy Consult for Heparin Indication: atrial fibrillation  Allergies  Allergen Reactions  . Diltiazem Hcl     REACTION: Looked like burned all over  . Lisinopril     REACTION: Looked like burned all over  . Metoprolol Succinate     REACTION: Looked like burned all over  . Propafenone Hcl     REACTION: Looked like burned all over    Patient Measurements: Height: 5\' 11"  (180.3 cm) Weight: 214 lb 1.1 oz (97.1 kg) IBW/kg (Calculated) : 75.3 Heparin Dosing Weight:   Vital Signs: Temp: 98.3 F (36.8 C) (07/08 2142) Temp Source: Oral (07/08 2142) BP: 131/55 (07/08 2142) Pulse Rate: 79 (07/08 2142)  Labs:  Recent Labs  09/01/16 0303 09/02/16 0452  09/02/16 1732 09/03/16 0205 09/03/16 1134 09/03/16 2210  HGB 10.2* 9.4*  --   --  9.2*  --   --   HCT 30.2* 27.7*  --   --  27.5*  --   --   PLT 240 234  --   --  243  --   --   APTT  --   --   --  73* 85*  --   --   HEPARINUNFRC  --   --   < >  --  0.17* 0.27* 0.26*  CREATININE 1.28* 1.45*  --   --   --   --   --   < > = values in this interval not displayed.  Estimated Creatinine Clearance: 46.7 mL/min (A) (by C-G formula based on SCr of 1.45 mg/dL (H)).   Medications:  Infusions:  . Marland KitchenTPN (CLINIMIX-E) Adult 60 mL/hr at 09/03/16 1703   And  . fat emulsion 240 mL (09/03/16 1703)  . dextrose 5 % and 0.45% NaCl 50 mL/hr at 09/01/16 1800  . heparin 1,650 Units/hr (09/03/16 2202)    Assessment: Patient with heparin level below goal again.  No heparin issues per RN.  Goal of Therapy:  Heparin level 0.3-0.5 units/ml (using lower heparin level goal range d/t high bleeding risk) Monitor platelets by anticoagulation protocol: Yes   Plan:  Increase heparin to 1750 units/hr Recheck level at 0800  Nani Skillern Crowford 09/03/2016,11:25 PM

## 2016-09-03 NOTE — Progress Notes (Signed)
Nutrition Follow-up  INTERVENTION:   TPN per Pharmacy Will continue to monitor for plan  NUTRITION DIAGNOSIS:   Inadequate oral intake related to poor appetite as evidenced by per patient/family report.  GOAL:   Patient will meet greater than or equal to 90% of their needs  MONITOR:   Labs, Weight trends, I & O's, PO intake (TPN)  REASON FOR ASSESSMENT:   Consult New TPN/TNA  ASSESSMENT:   83 year old male with PMH of PAF on Xarelto, HLD, OSA on CPAP, gout, presented with multiple episodes of painless BRBPR, weakness, diaphoresis, went to [facility] ED where hemoglobin around 12 g, initially A. fib with RVR which improved with IV fluids, CT abdomen and pelvis showed sigmoid diverticulosis without acute findings, transferred to [facility] stepdown unit. Hemodynamically and hemoglobin stable. [facility] consulted and patient underwent colonoscopy 6/28. Cecal lesion noted-Gen. surgery consulted and plan possible surgery 7/2. Cardiology provided preop clearance and made recommendations regarding A. fib management that patient developed overnight prior to surgery.  Patient now with PICC receiving TPN. Currently receiving Clinimix 5/20 @ 40 ml/hr w/ ILE @ 10 ml/hr. Pt on a full liquid diet d/t tolerating clear liquids. Will continue to follow and address supplement needs at follow-up.   Medications:IV Reglan every 8 hours   Labs reviewed: CBGs: 103-159  Plan per Pharmacy 7/8: At 1800 today:  Increase Clinimix E 5/20 to 60 ml/hr.  20% fat emulsion at 20 ml/h over 12 hrs  Diet Order:  TPN (CLINIMIX-E) Adult Diet full liquid Room service appropriate? Yes; Fluid consistency: Thin .TPN (CLINIMIX-E) Adult  Skin:   (7/2 abdominal incision)  Last BM:  7/8  Height:   Ht Readings from Last 1 Encounters:  08/28/16 5\' 11"  (1.803 m)    Weight:   Wt Readings from Last 1 Encounters:  09/01/16 214 lb 1.1 oz (97.1 kg)    Ideal Body Weight:  78.2  kg  BMI:  Body mass index is 29.86 kg/m.  Estimated Nutritional Needs:   Kcal:  2100-2300  Protein:  110-120g  Fluid:  2.1L/day  EDUCATION NEEDS:   No education needs identified at this time  Clayton Bibles, MS, RD, LDN Pager: [phone] After Hours Pager: [phone]

## 2016-09-03 NOTE — NC FL2 (Signed)
[place] LEVEL OF CARE SCREENING TOOL     IDENTIFICATION  Patient Name: Bradley Short Birthdate: 02-Aug-1932 Sex: male Admission Date (Current Location): 08/21/2016  [facility] and [place] Number:  Herbalist and Address:  [facility],  [place], [place]      Provider Number: [id number]  Attending Physician Name and Address:  Jovita Kussmaul, MD  Relative Name and Phone Number:       Current Level of Care: Hospital Recommended Level of Care: [place] Prior Approval Number:    Date Approved/Denied:   PASRR Number: [id number]  Discharge Plan: SNF    Current Diagnoses: Patient Active Problem List   Diagnosis Date Noted  . H/O colectomy 08/28/2016  . Gout 08/22/2016  . Leukocytosis 08/22/2016  . Thrombocytopenia ([place]) 08/22/2016  . CKD (chronic kidney disease), stage III 08/22/2016  . Syncope, vasovagal 08/22/2016  . Acute GI bleeding 08/21/2016  . Acute blood loss anemia 08/21/2016  . Erectile dysfunction 02/01/2015  . ATRIAL FLUTTER 05/10/2010  . Pure hypercholesterolemia 02/25/2009  . HYPERLIPIDEMIA 02/25/2009  . OBSTRUCTIVE SLEEP APNEA 02/25/2009  . HYPERTENSION 02/25/2009  . ATRIAL FIBRILLATION 02/25/2009    Orientation RESPIRATION BLADDER Height & Weight     Self, Time, Situation, Place  Normal Continent Weight: 214 lb 1.1 oz (97.1 kg) Height:  5\' 11"  (180.3 cm)  BEHAVIORAL SYMPTOMS/MOOD NEUROLOGICAL BOWEL NUTRITION STATUS      Continent  (PICC line, TPN)  AMBULATORY STATUS COMMUNICATION OF NEEDS Skin   Extensive Assist Verbally Surgical wounds (closed incision, abdomen)                       Personal Care Assistance Level of Assistance  Bathing, Feeding, Dressing Bathing Assistance: Limited assistance Feeding assistance: Independent Dressing Assistance: Limited assistance     Functional Limitations Info  Sight, Hearing, Speech Sight Info: Adequate Hearing Info:  Adequate Speech Info: Adequate    SPECIAL CARE FACTORS FREQUENCY  PT (By licensed PT), OT (By licensed OT)     PT Frequency: 5x OT Frequency: 5x            Contractures Contractures Info: Not present    Additional Factors Info  Code Status, Allergies Code Status Info: full Allergies Info:  Diltiazem Hcl, Lisinopril, Metoprolol Succinate, Propafenone Hcl           Current Medications (09/03/2016):  This is the current hospital active medication list Current Facility-Administered Medications  Medication Dose Route Frequency Provider Last Rate Last Dose  . Marland KitchenTPN (CLINIMIX-E) Adult   Intravenous Continuous TPN Lynelle Doctor, RPH 60 mL/hr at 09/03/16 1703     And  . fat emulsion 20 % infusion 240 mL  240 mL Intravenous Continuous TPN Lynelle Doctor, RPH 20 mL/hr at 09/03/16 1703 240 mL at 09/03/16 1703  . amiodarone (PACERONE) tablet 200 mg  200 mg Oral BID Jacolyn Reedy, MD   200 mg at 09/03/16 1152  . calamine lotion   Topical BID Samtani, Jai-Gurmukh, MD      . dextrose 5 %-0.45 % sodium chloride infusion   Intravenous Continuous Minda Ditto, RPH 50 mL/hr at 09/01/16 1800    . heparin ADULT infusion 100 units/mL (25000 units/269mL sodium chloride 0.45%)  1,650 Units/hr Intravenous Continuous Pham, Anh P, RPH 16.5 mL/hr at 09/03/16 1226 1,650 Units/hr at 09/03/16 1226  . insulin aspart (novoLOG) injection 0-9 Units  0-9 Units Subcutaneous Q6H Shade, Haze Justin, RPH  1 Units at 09/03/16 1300  . LORazepam (ATIVAN) 2 MG/ML concentrated solution 0.5-1 mg  0.5-1 mg Oral Q6H PRN Nita Sells, MD      . metoCLOPramide (REGLAN) injection 5 mg  5 mg Intravenous Q8H Nita Sells, MD   5 mg at 09/03/16 1435  . ondansetron (ZOFRAN) injection 4 mg  4 mg Intravenous Q6H PRN Rise Patience, MD   4 mg at 08/31/16 2356  . sodium chloride flush (NS) 0.9 % injection 10-40 mL  10-40 mL Intracatheter PRN Jovita Kussmaul, MD         Discharge Medications: Please see  discharge summary for a list of discharge medications.  Relevant Imaging Results:  Relevant Lab Results:   Additional Information OI#[id number]  Nila Nephew, LCSW

## 2016-09-03 NOTE — Progress Notes (Signed)
ANTICOAGULATION CONSULT NOTE - Follow Up Consult  Pharmacy Consult for xarelto --> heparin Indication: atrial fibrillation  Allergies  Allergen Reactions  . Diltiazem Hcl     REACTION: Looked like burned all over  . Lisinopril     REACTION: Looked like burned all over  . Metoprolol Succinate     REACTION: Looked like burned all over  . Propafenone Hcl     REACTION: Looked like burned all over    Patient Measurements: Height: 5\' 11"  (180.3 cm) Weight: 214 lb 1.1 oz (97.1 kg) IBW/kg (Calculated) : 75.3 Heparin Dosing Weight:   Vital Signs: Temp: 98.5 F (36.9 C) (07/07 2038) Temp Source: Oral (07/07 2038) BP: 144/55 (07/07 2038) Pulse Rate: 79 (07/07 2038)  Labs:  Recent Labs  08/31/16 1188  09/01/16 0303 09/02/16 0452 09/02/16 1730 09/02/16 1732 09/03/16 0205  HGB  --   < > 10.2* 9.4*  --   --  9.2*  HCT  --   --  30.2* 27.7*  --   --  27.5*  PLT  --   --  240 234  --   --  243  APTT  --   --   --   --   --  73* 85*  HEPARINUNFRC  --   --   --   --  0.24*  --  0.17*  CREATININE 1.41*  --  1.28* 1.45*  --   --   --   < > = values in this interval not displayed.  Estimated Creatinine Clearance: 46.7 mL/min (A) (by C-G formula based on SCr of 1.45 mg/dL (H)).   Medications:  Infusions:  . amiodarone 30 mg/hr (09/02/16 2036)  . dextrose 5 % and 0.45% NaCl 50 mL/hr at 09/01/16 1800  . diltiazem (CARDIZEM) infusion 3 mg/hr (09/02/16 1839)  . Marland KitchenTPN (CLINIMIX-E) Adult 40 mL/hr at 09/02/16 1829   And  . fat emulsion 240 mL (09/02/16 1829)  . heparin 1,550 Units/hr (09/03/16 0317)    Assessment: Patient with low heparin level again and PTT at goal.  Prior rivaroxaban is NOT causing a falsely elevated heparin level, therefore will only use heparin level going forward.  Goal of Therapy:  Heparin level 0.3-0.5 units/ml (using lower heparin level goal range d/t high bleeding risk) Monitor platelets by anticoagulation protocol: Yes   Plan:  Increase heparin to  1550 units/hr Recheck level at [place] 09/03/2016,3:29 AM

## 2016-09-03 NOTE — Progress Notes (Signed)
ANTICOAGULATION CONSULT NOTE - Follow Up Consult  Pharmacy Consult for xarelto --> heparin Indication: atrial fibrillation  Allergies  Allergen Reactions  . Diltiazem Hcl     REACTION: Looked like burned all over  . Lisinopril     REACTION: Looked like burned all over  . Metoprolol Succinate     REACTION: Looked like burned all over  . Propafenone Hcl     REACTION: Looked like burned all over    Patient Measurements: Height: 5\' 11"  (180.3 cm) Weight: 214 lb 1.1 oz (97.1 kg) IBW/kg (Calculated) : 75.3 Heparin Dosing Weight: 93 kg  Vital Signs: Temp: 98.1 F (36.7 C) (07/08 0558) Temp Source: Oral (07/08 0558) BP: 137/57 (07/08 0558) Pulse Rate: 72 (07/08 0558)  Labs:  Recent Labs  09/01/16 0303 09/02/16 0452 09/02/16 1730 09/02/16 1732 09/03/16 0205 09/03/16 1134  HGB 10.2* 9.4*  --   --  9.2*  --   HCT 30.2* 27.7*  --   --  27.5*  --   PLT 240 234  --   --  243  --   APTT  --   --   --  73* 85*  --   HEPARINUNFRC  --   --  0.24*  --  0.17* 0.27*  CREATININE 1.28* 1.45*  --   --   --   --     Estimated Creatinine Clearance: 46.7 mL/min (A) (by C-G formula based on SCr of 1.45 mg/dL (H)).  Assessment: 60 yoM admitted on 6/25 with rectal bleeding and PMH significant for afib on chronic Xarelto anticoagulation.  Xarelto was held for colonoscopy on 6/28 and Heparin IV was started on 6/29.  No active bleeding found, but incidentally found cecal mass that was suspicious for malignancy.  Surgery completed colectomy on 7/2. 7/3:  Heparin drip started post-op 7/5:  Transitioned back to xarelto 7/7:  Transitioned back to heparin drip at 0900 d/t ileus and NPO status (last xarelto dose was on 7/6 at 0925).   Today, 09/03/2016: - Heparin level is slightly low at 0.27 with rate increased to 1550 units/hr this morning. - cbc low but relatively stable - no bleeding documented  Goal of Therapy:  Heparin level 0.3-0.5 units/ml (using lower heparin level goal range d/t high  bleeding risk) APTT 66-85 sec (corresponds to 0.3-0.5 Hep level) Monitor platelets by anticoagulation protocol: Yes   Plan:  - Increase heparin drip to 1650 units/hr - check 8 hr heparin level  - monitor for s/s bleeding  Dia Sitter P 09/03/2016,12:09 PM

## 2016-09-04 ENCOUNTER — Inpatient Hospital Stay (HOSPITAL_COMMUNITY): Payer: Medicare Other

## 2016-09-04 DIAGNOSIS — R600 Localized edema: Secondary | ICD-10-CM

## 2016-09-04 LAB — CBC
HCT: 27.8 % — ABNORMAL LOW (ref 39.0–52.0)
Hemoglobin: 9.2 g/dL — ABNORMAL LOW (ref 13.0–17.0)
MCH: 29.3 pg (ref 26.0–34.0)
MCHC: 33.1 g/dL (ref 30.0–36.0)
MCV: 88.5 fL (ref 78.0–100.0)
Platelets: 272 10*3/uL (ref 150–400)
RBC: 3.14 MIL/uL — AB (ref 4.22–5.81)
RDW: 15.5 % (ref 11.5–15.5)
WBC: 14.9 10*3/uL — ABNORMAL HIGH (ref 4.0–10.5)

## 2016-09-04 LAB — COMPREHENSIVE METABOLIC PANEL
ALK PHOS: 116 U/L (ref 38–126)
ALT: 94 U/L — AB (ref 17–63)
AST: 72 U/L — ABNORMAL HIGH (ref 15–41)
Albumin: 2.5 g/dL — ABNORMAL LOW (ref 3.5–5.0)
Anion gap: 7 (ref 5–15)
BUN: 9 mg/dL (ref 6–20)
CALCIUM: 8.2 mg/dL — AB (ref 8.9–10.3)
CO2: 25 mmol/L (ref 22–32)
CREATININE: 1.24 mg/dL (ref 0.61–1.24)
Chloride: 108 mmol/L (ref 101–111)
GFR calc non Af Amer: 52 mL/min — ABNORMAL LOW (ref >60)
GLUCOSE: 134 mg/dL — AB (ref 65–99)
Potassium: 3.4 mmol/L — ABNORMAL LOW (ref 3.5–5.1)
SODIUM: 140 mmol/L (ref 135–145)
Total Bilirubin: 0.6 mg/dL (ref 0.3–1.2)
Total Protein: 6 g/dL — ABNORMAL LOW (ref 6.5–8.1)

## 2016-09-04 LAB — PREALBUMIN: PREALBUMIN: 11.1 mg/dL — AB (ref 18–38)

## 2016-09-04 LAB — DIFFERENTIAL
BASOS PCT: 0 %
Basophils Absolute: 0 10*3/uL (ref 0.0–0.1)
EOS ABS: 0.4 10*3/uL (ref 0.0–0.7)
EOS PCT: 3 %
LYMPHS PCT: 8 %
Lymphs Abs: 1.2 10*3/uL (ref 0.7–4.0)
Monocytes Absolute: 1.2 10*3/uL — ABNORMAL HIGH (ref 0.1–1.0)
Monocytes Relative: 8 %
Neutro Abs: 12.1 10*3/uL — ABNORMAL HIGH (ref 1.7–7.7)
Neutrophils Relative %: 81 %

## 2016-09-04 LAB — GLUCOSE, CAPILLARY
GLUCOSE-CAPILLARY: 151 mg/dL — AB (ref 65–99)
GLUCOSE-CAPILLARY: 131 mg/dL — AB (ref 65–99)
Glucose-Capillary: 119 mg/dL — ABNORMAL HIGH (ref 65–99)

## 2016-09-04 LAB — PHOSPHORUS: PHOSPHORUS: 2.8 mg/dL (ref 2.5–4.6)

## 2016-09-04 LAB — HEPARIN LEVEL (UNFRACTIONATED)
HEPARIN UNFRACTIONATED: 0.51 [IU]/mL (ref 0.30–0.70)
HEPARIN UNFRACTIONATED: 0.38 [IU]/mL (ref 0.30–0.70)

## 2016-09-04 LAB — TRIGLYCERIDES: Triglycerides: 104 mg/dL (ref <150)

## 2016-09-04 LAB — MAGNESIUM: Magnesium: 2.3 mg/dL (ref 1.7–2.4)

## 2016-09-04 MED ORDER — IOPAMIDOL (ISOVUE-300) INJECTION 61%
100.0000 mL | Freq: Once | INTRAVENOUS | Status: AC | PRN
  Administered 2016-09-04: 100 mL via INTRAVENOUS

## 2016-09-04 MED ORDER — TRACE MINERALS CR-CU-MN-SE-ZN 10-1000-500-60 MCG/ML IV SOLN
INTRAVENOUS | Status: AC
  Administered 2016-09-04: 18:00:00 via INTRAVENOUS
  Filled 2016-09-04: qty 1992

## 2016-09-04 MED ORDER — POTASSIUM CHLORIDE 20 MEQ/15ML (10%) PO SOLN
40.0000 meq | Freq: Once | ORAL | Status: AC
  Administered 2016-09-04: 40 meq via ORAL
  Filled 2016-09-04: qty 30

## 2016-09-04 MED ORDER — FUROSEMIDE 20 MG PO TABS
20.0000 mg | ORAL_TABLET | Freq: Every day | ORAL | Status: AC
  Administered 2016-09-04 – 2016-09-05 (×2): 20 mg via ORAL
  Filled 2016-09-04 (×2): qty 1

## 2016-09-04 MED ORDER — FAT EMULSION 20 % IV EMUL
240.0000 mL | INTRAVENOUS | Status: AC
  Administered 2016-09-04: 240 mL via INTRAVENOUS
  Filled 2016-09-04: qty 250

## 2016-09-04 MED ORDER — IOPAMIDOL (ISOVUE-300) INJECTION 61%
INTRAVENOUS | Status: AC
  Filled 2016-09-04: qty 30

## 2016-09-04 MED ORDER — HYDROXYZINE HCL 10 MG PO TABS
10.0000 mg | ORAL_TABLET | Freq: Three times a day (TID) | ORAL | Status: DC | PRN
  Administered 2016-09-04 – 2016-09-05 (×2): 10 mg via ORAL
  Filled 2016-09-04 (×3): qty 1

## 2016-09-04 MED ORDER — IOPAMIDOL (ISOVUE-300) INJECTION 61%
15.0000 mL | Freq: Two times a day (BID) | INTRAVENOUS | Status: DC | PRN
  Administered 2016-09-04: 15 mL via ORAL
  Filled 2016-09-04: qty 30

## 2016-09-04 MED ORDER — IOPAMIDOL (ISOVUE-300) INJECTION 61%
INTRAVENOUS | Status: AC
  Filled 2016-09-04: qty 100

## 2016-09-04 MED ORDER — DEXTROSE-NACL 5-0.45 % IV SOLN
INTRAVENOUS | Status: DC
  Administered 2016-09-05 – 2016-09-06 (×2): via INTRAVENOUS

## 2016-09-04 MED ORDER — AMIODARONE HCL 200 MG PO TABS
200.0000 mg | ORAL_TABLET | Freq: Every day | ORAL | Status: DC
  Administered 2016-09-06 – 2016-09-07 (×2): 200 mg via ORAL
  Filled 2016-09-04 (×2): qty 1

## 2016-09-04 NOTE — Progress Notes (Signed)
Patient ID: [id number] Baik, male   DOB: 05-08-1932, 83 y.o.   MRN: [id number]  [facility] Surgery Progress Note  7 Days Post-Op  Subjective: CC- fatigue Patient just ambulated in hall with PT. Sore but pain well controlled. He reports little current pain in abdomen. Tolerating full liquids. Denies n/v. States that his appetite is starting to return. Has had about 5 loose BM's since yesterday.  Objective: Vital signs in last 24 hours: Temp:  [98 F (36.7 C)-98.3 F (36.8 C)] 98 F (36.7 C) (07/09 0602) Pulse Rate:  [72-79] 72 (07/09 0602) Resp:  [18] 18 (07/09 0602) BP: (131-137)/(55-62) 135/55 (07/09 0602) SpO2:  [98 %] 98 % (07/09 0602) Last BM Date: 09/03/16  Intake/Output from previous day: 07/08 0701 - 07/09 0700 In: 3017.9 [P.O.:480; I.V.:2537.9] Out: 1240 [Urine:1240] Intake/Output this shift: No intake/output data recorded.  PE: Gen: Alert, NAD, pleasant HEENT: EOM's intact, pupils equal  Card: RRR Pulm: CTAB, no W/R/R, effort normal Abd: Soft, mild/moderate distension (improved since I last saw him), +BS, midlineincision C/D/I with staples intact, diffuse erythematous rash on trunk and limbs >> patient states this appeared after TPN was started Psych: A&Ox3  Ext: 2+ pitting edema BLE, no calf tenderness  Lab Results:   Recent Labs  09/03/16 0205 09/04/16 0808  WBC 11.5* 14.9*  HGB 9.2* 9.2*  HCT 27.5* 27.8*  PLT 243 272   BMET  Recent Labs  09/02/16 0452 09/04/16 0808  NA 137 140  K 3.4* 3.4*  CL 107 108  CO2 25 25  GLUCOSE 130* 134*  BUN 13 9  CREATININE 1.45* 1.24  CALCIUM 8.0* 8.2*   PT/INR No results for input(s): LABPROT, INR in the last 72 hours. CMP     Component Value Date/Time   NA 140 09/04/2016 0808   K 3.4 (L) 09/04/2016 0808   CL 108 09/04/2016 0808   CO2 25 09/04/2016 0808   GLUCOSE 134 (H) 09/04/2016 0808   BUN 9 09/04/2016 0808   CREATININE 1.24 09/04/2016 0808   CALCIUM 8.2 (L) 09/04/2016 0808   PROT 6.0 (L)  09/04/2016 0808   ALBUMIN 2.5 (L) 09/04/2016 0808   AST 72 (H) 09/04/2016 0808   ALT 94 (H) 09/04/2016 0808   ALKPHOS 116 09/04/2016 0808   BILITOT 0.6 09/04/2016 0808   GFRNONAA 52 (L) 09/04/2016 0808   GFRAA >60 09/04/2016 0808   Lipase  No results found for: LIPASE     Studies/Results: Dg Chest Port 1 View  Result Date: 09/02/2016 CLINICAL DATA:  PICC line placement EXAM: PORTABLE CHEST 1 VIEW COMPARISON:  09/01/2016 FINDINGS: 1643 hours. Low volume film with basilar atelectasis. No overt edema. The cardio pericardial silhouette is enlarged. Right PICC line is new in the interval with the tip positioned over the mid SVC level. Telemetry leads overlie the chest. IMPRESSION: Right PICC line tip overlies the mid SVC level. Electronically Signed   By: Misty Stanley M.D.   On: 09/02/2016 17:20   Dg Abd Acute W/chest  Result Date: 09/03/2016 CLINICAL DATA:  Increased white blood cell count. EXAM: DG ABDOMEN ACUTE W/ 1V CHEST COMPARISON:  09/02/2016 chest x-ray.  Abdomen study from 09/01/2016. FINDINGS: Low lung volumes without edema or focal airspace consolidation. Atelectasis noted at the lung bases. Cardiopericardial silhouette is at upper limits of normal for size. Right PICC line tip overlies the mid to distal SVC level. The visualized bony structures of the thorax are intact. Large gastric bubble noted. Diffuse gaseous small bowel distension persists and  is similar to prior. Small bowel loops in the central abdomen measure up to 5 cm diameter which is not substantially changed. Stool and gas are noted in the right colon. IMPRESSION: 1. Low lung volumes with basilar atelectasis. 2. Prominent gastric bubble with diffuse gaseous small bowel dilatation, similar to the study from 09/01/2016. Postop day 6 for resection of colon mass suggests findings are related to postoperative adynamic ileus. Electronically Signed   By: Misty Stanley M.D.   On: 09/03/2016 09:00     Anti-infectives: Anti-infectives    Start     Dose/Rate Route Frequency Ordered Stop   08/28/16 1133  cefoTEtan in Dextrose 5% (CEFOTAN) 2-2.08 GM-% IVPB    Comments:  Carleene Cooper   : cabinet override      08/28/16 1133 08/28/16 1145   08/28/16 0945  cefoTEtan (CEFOTAN) 2 g in dextrose 5 % 50 mL IVPB     2 g 100 mL/hr over 30 Minutes Intravenous On call to O.R. 08/28/16 0941 08/28/16 1710       Assessment/Plan Acute lower GIB - etiology diverticular disease vs malignancy  S/P COLONOSCOPY 6/28: Cecal/appendiceal orifice mass - BX shows high grade dysplasia with small focus conerning for adenocarcinoma  S/P LAPAROSCOPIC ASSISTED RIGHT COLECTOMY 08/28/16 Dr. 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 Wait on transitioning to oral meds and advancing diet until after CT is back - continue PT/ambulation and encourage IS  Hypokalemia - K 3.4 ABL anemia- hgb 9.2, stable Paroxysmal atrial fibrillation - On amiodarone gtt. Cards following.  HLD CKD, stage III  Gout - allopurinol  FEN- IVF, full liquid diet ID- perioperative cefotetan 7/2 VTE- SCD's, heparin   LOS: 14 days    Jerrye Beavers , [facility] Surgery 09/04/2016, 10:28 AM Pager: [phone] Consults: [phone] Mon-Fri 7:00 am-4:30 pm Sat-Sun 7:00 am-11:30 am

## 2016-09-04 NOTE — Progress Notes (Signed)
   09/04/16 0900  Clinical Encounter Type  Visited With Patient;Patient and family together;Health care provider  Visit Type Initial;Psychological support;Spiritual support;Social support  Referral From Nurse  Consult/Referral To Chaplain;Faith community  Spiritual Encounters  Spiritual Needs Prayer;Emotional;Grief support  Stress Factors  Patient Stress Factors Health changes;Loss of control;Major life changes  Family Stress Factors Loss of control;Major life changes  [address] 1439-------MRN [id number] Chaplain was page to provide emotional support to Pt and the Pt's wife. According to the RN Pt has been very anxious. When Chaplain entered the room family was at bedside including a few friends. Pt asked if chaplain could return during a time when he felt the most anxious. Chaplain agreed and returned around 11:00pm. Pt was awake and reported to have a hard time sleeping. Chaplain provided a listening ear while Pt explored his fears and hopes relating to his health.   Pastoral Interventions: Chaplain shared in prayer and listened empathically  Chaplain heard confession

## 2016-09-04 NOTE — Care Management Important Message (Signed)
Important Message  Patient Details  Name: Antwion Carpenter MRN: [id number] Date of Birth: 06-Nov-1932   Medicare Important Message Given:  Yes    Kerin Salen 09/04/2016, 10:26 AMImportant Message  Patient Details  Name: Jacquan Savas MRN: [id number] Date of Birth: [birth year]   Medicare Important Message Given:  Yes    Kerin Salen 09/04/2016, 10:26 AM

## 2016-09-04 NOTE — Progress Notes (Signed)
ANTICOAGULATION CONSULT NOTE - Follow Up Consult  Pharmacy Consult for heparin Indication: atrial fibrillation  Allergies  Allergen Reactions  . Diltiazem Hcl     REACTION: Looked like burned all over  . Lisinopril     REACTION: Looked like burned all over  . Metoprolol Succinate     REACTION: Looked like burned all over  . Propafenone Hcl     REACTION: Looked like burned all over    Patient Measurements: Height: 5\' 11"  (180.3 cm) Weight: 214 lb 1.1 oz (97.1 kg) IBW/kg (Calculated) : 75.3 Heparin Dosing Weight: 93 kg  Vital Signs: Temp: 97.7 F (36.5 C) (07/09 1436) Temp Source: Oral (07/09 1436) BP: 133/53 (07/09 1436) Pulse Rate: 64 (07/09 1436)  Labs:  Recent Labs  09/02/16 0452  09/02/16 1732 09/03/16 0205  09/03/16 2210 09/04/16 0808 09/04/16 1602  HGB 9.4*  --   --  9.2*  --   --  9.2*  --   HCT 27.7*  --   --  27.5*  --   --  27.8*  --   PLT 234  --   --  243  --   --  272  --   APTT  --   --  73* 85*  --   --   --   --   HEPARINUNFRC  --   < >  --  0.17*  < > 0.26* 0.51 0.38  CREATININE 1.45*  --   --   --   --   --  1.24  --   < > = values in this interval not displayed.  Estimated Creatinine Clearance: 54.6 mL/min (by C-G formula based on SCr of 1.24 mg/dL).  Assessment: 41 yoM admitted on 6/25 with rectal bleeding and PMH significant for afib on chronic Xarelto anticoagulation.  Xarelto was held for colonoscopy on 6/28 and Heparin IV was started on 6/29.  No active bleeding found, but incidentally found cecal mass that was suspicious for malignancy.  Surgery completed colectomy on 7/2.  Significant events: 7/3:  Heparin drip started post-op 7/5:  Transitioned back to xarelto 7/7:  Transitioned back to heparin drip at 0900 d/t ileus and NPO status (last xarelto dose was on 7/6 at 0925).  Today, 09/04/2016: - Heparin level at 1602 = 0.38 units/mL, therapeutic on heparin infusion at 1750 units/hr - CBC low but relatively stable - No bleeding  documented or reported per nursing  Goal of Therapy:  Heparin level 0.3-0.5 units/ml (using lower heparin level goal range d/t high bleeding risk) Monitor platelets by anticoagulation protocol: Yes   Plan:  - Continue heparin infusin at 1750 units/hr - Daily CBC and heparin level while on heparin infusion - Monitor closely for s/sx of bleeding   Lindell Spar, PharmD, BCPS Pager: [phone] 09/04/2016 4:54 PM

## 2016-09-04 NOTE — Progress Notes (Signed)
[facility] NOTE   Pharmacy Consult for TPN Indication: prolonged ileus  Patient Measurements: Height: 5\' 11"  (180.3 cm) Weight: 214 lb 1.1 oz (97.1 kg) IBW/kg (Calculated) : 75.3 TPN AdjBW (KG): 92.6 Body mass index is 29.86 kg/m.  Assessment: 51 yoM admitted on 6/25 with rectal bleeding, s/p colonoscopy on 6/28 and colectomy on 7/2.  He now has prolonged ileus and pharmacy is consulted to dose TPN.  Central access:  PICC on 7/7 TPN start date: 7/7  Insulin Requirements: 5 units from SSI over past 24 hours  Current Nutrition: FLD  IVF: D5-1/2NS at 50 ml/hr  Today, 09/04/2016:   Glucose (goal <150): no hx of DM; only 1 CBG slightly above goal at 151  Electrolytes - K repleted yesterday, still low at 3.4 today. Others WNL including CorrCa, phos and Mag.  Renal - SCr labile, decreased today. BUN WNL.  LFTs - WNL (7/6)  TGs - 139 (7/7), 104 (7/9)  Prealbumin - 10.3 (7/7)  Per surgery note today, patient continues to have bowel function, no n/v, tolerating full liquids.  Waiting on CT abdomen/pelvis results from today before advancing diet.  NUTRITIONAL GOALS                                                                                             RD recs (7/5):  Kcal:  2100-2300, Protein:  110-120g  Clinimix 5/15 at 100 ml/hr over 24 hours + 20% fat emulsion at 20 ml/hr over 12 hours will provide: 120g protein and 2184 kcal daily  PLAN                                                                                                                        KCl 40 mEq solution x 1.  At 1800 today:  Will change Clinimix to E 5/15 product since think this formulation will help better achieve both our protein and kcal goals.  Will advance the rate today as well since tolerating 5/20 at 60 ml/hr currently.  Tonight, transition to Clinimix E 5/20 at 83 ml/hr.  20% fat emulsion at 20 ml/h over 12 hrs  TPN to contain standard  multivitamins daily and trace elements on MWF due to [employer].  Will reduce IVF to 30 ml/hr to account for volume increase provided by TPN.  Continue sensitive SSI and CBGs q6h  TPN lab panels on Mondays & Thursdays. BMET in AM to check on K+ replacement.  F/u daily.  F/u for advancement of diet.  Hershal Coria, PharmD, BCPS Pager: [phone] 09/04/2016 12:09 PM

## 2016-09-04 NOTE — Progress Notes (Signed)
ANTICOAGULATION CONSULT NOTE - Follow Up Consult  Pharmacy Consult for heparin Indication: atrial fibrillation  Allergies  Allergen Reactions  . Diltiazem Hcl     REACTION: Looked like burned all over  . Lisinopril     REACTION: Looked like burned all over  . Metoprolol Succinate     REACTION: Looked like burned all over  . Propafenone Hcl     REACTION: Looked like burned all over    Patient Measurements: Height: 5\' 11"  (180.3 cm) Weight: 214 lb 1.1 oz (97.1 kg) IBW/kg (Calculated) : 75.3 Heparin Dosing Weight: 93 kg  Vital Signs: Temp: 98 F (36.7 C) (07/09 0602) Temp Source: Oral (07/09 0602) BP: 135/55 (07/09 0602) Pulse Rate: 72 (07/09 0602)  Labs:  Recent Labs  09/02/16 0452  09/02/16 1732 09/03/16 0205 09/03/16 1134 09/03/16 2210 09/04/16 0808  HGB 9.4*  --   --  9.2*  --   --  9.2*  HCT 27.7*  --   --  27.5*  --   --  27.8*  PLT 234  --   --  243  --   --  272  APTT  --   --  73* 85*  --   --   --   HEPARINUNFRC  --   < >  --  0.17* 0.27* 0.26* 0.51  CREATININE 1.45*  --   --   --   --   --   --   < > = values in this interval not displayed.  Estimated Creatinine Clearance: 46.7 mL/min (A) (by C-G formula based on SCr of 1.45 mg/dL (H)).  Assessment: 89 yoM admitted on 6/25 with rectal bleeding and PMH significant for afib on chronic Xarelto anticoagulation.  Xarelto was held for colonoscopy on 6/28 and Heparin IV was started on 6/29.  No active bleeding found, but incidentally found cecal mass that was suspicious for malignancy.  Surgery completed colectomy on 7/2.  Significant events: 7/3:  Heparin drip started post-op 7/5:  Transitioned back to xarelto 7/7:  Transitioned back to heparin drip at 0900 d/t ileus and NPO status (last xarelto dose was on 7/6 at 0925).  Today, 09/04/2016: - Heparin level therapeutic with infusion at 1750 units/hr - CBC low but relatively stable - No bleeding documented  Goal of Therapy:  Heparin level 0.3-0.5 units/ml  (using lower heparin level goal range d/t high bleeding risk) Monitor platelets by anticoagulation protocol: Yes   Plan:  - Continue heparin drip at 1750 units/hr - Recheck 8 hr heparin level, will need to reduce rate if levels trend up since trying to obtain levels at lower end of therapeutic range (0.3-0.5 units/ml) - daily CBC and heparin level while on heparin infusion  Lindzie Boxx 09/04/2016,8:56 AM

## 2016-09-04 NOTE — Progress Notes (Signed)
PROGRESS NOTE   Bradley Short  BDZ:[id number]    DOB: 10-27-1932    DOA: 08/21/2016  PCP: Manon Hilding, MD   I have briefly reviewed patients previous medical records in [place].  Brief Narrative:   84 PAF on Xarelto, HLD,  OSA on CPAP gout,  presented with multiple episodes of painless BRBPR, weakness, diaphoresis, went to [facility] ED where hemoglobin around 12 g, initially A. fib with RVR which improved with IV fluids   CT abdomen and pelvis showed sigmoid diverticulosis without acute findings,   transferred to [facility] stepdown unit. Hemodynamically and hemoglobin stable.   [facility] consulted and patient underwent colonoscopy 6/28.   Cecal lesion noted-Gen. surgery consulted  Cardiology provided preop clearance and made recommendations regarding A. fib management that patient developed overnight prior to surgery.  Assessment & Plan:   Principal Problem:   Acute GI bleeding Active Problems:   Pure hypercholesterolemia   ATRIAL FIBRILLATION   Acute blood loss anemia   Gout   Leukocytosis   Thrombocytopenia (HCC)   CKD (chronic kidney disease), stage III   Syncope, vasovagal   H/O colectomy   Lower extremity edema  Acute blood loss anemia: Secondary to GI bleed. Stable. Acute GI bleed, suspect LGIB:   Cecal mass s/p R Colectomy 7/2-HIGH GRADE DYSPLASIA AND SMALL FOCUS  SUSPICIOUS FOR ADENOCARCINOMA. Postop ileus on x-ray 7/6  Preop CT abdomen without contrast 7/1: Sigmoid diverticulosis without acute  diverticulitis. No acute findings  [facility] follow-up, bleeding source likely diverticular in origin and has clinically  resolved.   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   Abdominal x-ray does not currently changed resolution of ileus 7/8   TPN started 7/8 through PICC line--appreciate pharmacist input  Getting CT scan 7/9 given rising white count.   Bipolar   On Clonopin ~ 3 yrs  prior  Situational stressors of surgery and cancer  Increased ativan to 0.5-->1 q6 scheduled 7/8 as anxious still  Starting Buspar 5 bid--hold given ileus 7/6   Thrombocytopenia: Secondary to acute blood loss. Resolved. Leukocytosis: Likely stress response. Resolved.---> White count has gone from the 20s to 8 on 7/6-->11.5  Syncope:   ? vasovagal due to acute GI bleed. Telemetry without significant arrhythmias as cause.  2-D echo: LVEF 60-65 percent. Moderate diastolic dysfunction. No significant valvular  abnormalities.  Mobilize. PT recommends no follow-up at this time. Paroxysmal atrial fibrillation with RVR-  back to sinus rhythm 7/8 while on antiarrhythmic  Not on rate control medications PTA.   Cardiology indicated low to moderate risk for surgery. Developed A. fib with RVR in the  170s overnight 7/1.  Dr H Discussed with Dr. Johnsie Cancel - adding IV amiodarone given    Cardizem gtt/amiodarone GTT-->7/9 amiodarone 200 ii/day X 6 doses, daily from 7/11  Appreciated further input from cardiology   Keep on heparin for now anticipating possible need for surgery  Lasix given 20 daily by mouth as [+] 7 L positive so far   Hyperlipidemia: Resumed medications post colonoscopy. Gout: No acute flare. Continue allopurinol.  Hypophosphatemia/hypomagensiema:  Stage III chronic kidney disease:   Baseline creatinine not known. Creatinine stable in the 1.2-1.3 range.  Replaced.  ICU electrolyte protocol  Rash  No new medications, vesicular and mainly over her lower extremities  Looks more like heat rash than anything else  Allow shower, use talcum powder   DVT prophylaxis: SCDs. Code Status: Full Family Communication:  Disposition:  Consultants:  [facility]  Gen. Surgery Cardiology  Procedures:  Colonoscopy 08/24/16: Impression:               - Preparation of the colon was fair.                           - One 7 mm polyp in the cecum, removed with a hot                            snare.  Complete resection. Polyp tissue not                            retrieved.                           - Likely malignant tumor in the cecum and at the                            appendiceal orifice. Biopsied. Tattooed.                           - Diverticulosis in the sigmoid colon, in the                            descending colon and at the splenic flexure.                           - Non-bleeding external hemorrhoids.                           - The examination was otherwise normal.  Underwent R lap assist Hemicolectomy 7/2-Dr. Marlou Starks  Pathology results:  Diagnosis Ileocecal valve, biopsy, mass - SESSILE SERRATED POLYP WITH HIGH GRADE DYSPLASIA AND SMALL FOCUS SUSPICIOUS FOR ADENOCARCINOMA.  Antimicrobials:  None    Subjective:  Doing fair About go to CT No chest pain Multiple stools today Passing gas well Still a little anxious   Objective:  Vitals:   09/03/16 1430 09/03/16 2142 09/04/16 0602 09/04/16 1436  BP: 137/62 (!) 131/55 (!) 135/55 (!) 133/53  Pulse: 77 79 72 64  Resp: 18 18 18 17   Temp: 98.2 F (36.8 C) 98.3 F (36.8 C) 98 F (36.7 C) 97.7 F (36.5 C)  TempSrc: Oral Oral Oral Oral  SpO2: 98% 98% 98% 100%  Weight:      Height:        Examination:  EOMI Slight anxiety however not severe Chest is clear Vesicular pinpoint rash posteriorly on legs- No lower extremity edema No JVD Abdomen is soft. Changes noted Neurologically intact  Data Reviewed: I have personally reviewed following labs and imaging studies  CBC:  Recent Labs Lab 08/31/16 0300 09/01/16 0303 09/02/16 0452 09/03/16 0205 09/04/16 0808  WBC 18.7* 8.3 9.1 11.5* 14.9*  NEUTROABS  --  6.0 6.5  --  12.1*  HGB 9.9* 10.2* 9.4* 9.2* 9.2*  HCT 29.4* 30.2* 27.7* 27.5* 27.8*  MCV 89.6 88.8 88.5 88.4 88.5  PLT 189 240 234 243 761   Basic Metabolic Panel:  Recent Labs Lab 08/30/16 0256 08/30/16 0300 08/31/16 0853 09/01/16 0303 09/02/16 0452 09/04/16 0808  NA 137  --  138  139 137 140  K 3.8  --  3.6 3.2* 3.4* 3.4*  CL 108  --  107 110 107 108  CO2 23  --  24 22 25 25   GLUCOSE 158*  --  141* 151* 130* 134*  BUN 15  --  14 12 13 9   CREATININE 1.42*  --  1.41* 1.28* 1.45* 1.24  CALCIUM 8.1*  --  8.3* 8.0* 8.0* 8.2*  MG  --  2.1  --  2.2 2.3 2.3  PHOS  --  2.2*  --   --  2.8 2.8   Liver Function Tests:  Recent Labs Lab 09/01/16 0303 09/02/16 0452 09/04/16 0808  AST 21 34 72*  ALT 19 30 94*  ALKPHOS 58 69 116  BILITOT 0.6 0.7 0.6  PROT 6.3* 5.9* 6.0*  ALBUMIN 2.7* 2.7* 2.5*   Coagulation Profile: No results for input(s): INR, PROTIME in the last 168 hours. CBG:  Recent Labs Lab 09/03/16 1158 09/03/16 1737 09/03/16 2342 09/04/16 0559 09/04/16 1148  GLUCAP 122* 142* 143* 151* 119*    Radiology Studies:  reviewed up to date current imaging for today    LOS: 14 days    Verneita Griffes, MD [facility] [phone]   09/04/2016, 2:40 PM

## 2016-09-04 NOTE — Progress Notes (Signed)
Progress Note  Patient Name: Bradley Short Date of Encounter: 09/04/2016  Primary Cardiologist: Allred/Hochrein  Subjective   83 y.o. male with PAF CHAD2VASC 3 on xarelto at home [place] of afib/flutter ablation 2012. Normal EF 60-65% no history of CAD Unclear reactions to a combination of meds at [facility] years ago and listed as intolerence to cardizem beta blocker and propafenone. "Looked like he was burnt"  S/P right colectomy 08/28/16   Complains of fatigue this morning. Sinus overnight  transitioned to oral meds yesterday  Inpatient Medications    Scheduled Meds: . amiodarone  200 mg Oral BID  . calamine   Topical BID  . insulin aspart  0-9 Units Subcutaneous Q6H  . iopamidol       Continuous Infusions: . Marland KitchenTPN (CLINIMIX-E) Adult 60 mL/hr at 09/03/16 1703  . dextrose 5 % and 0.45% NaCl 50 mL/hr at 09/04/16 0613  . heparin 1,750 Units/hr (09/03/16 2324)   PRN Meds: iopamidol, LORazepam, [DISCONTINUED] ondansetron **OR** ondansetron (ZOFRAN) IV, sodium chloride flush   Vital Signs    Vitals:   09/03/16 0558 09/03/16 1430 09/03/16 2142 09/04/16 0602  BP: (!) 137/57 137/62 (!) 131/55 (!) 135/55  Pulse: 72 77 79 72  Resp: 18 18 18 18   Temp: 98.1 F (36.7 C) 98.2 F (36.8 C) 98.3 F (36.8 C) 98 F (36.7 C)  TempSrc: Oral Oral Oral Oral  SpO2: 98% 98% 98% 98%  Weight:      Height:        Intake/Output Summary (Last 24 hours) at 09/04/16 1114 Last data filed at 09/04/16 0322  Gross per 24 hour  Intake          2777.92 ml  Output             1240 ml  Net          1537.92 ml   Filed Weights   08/26/16 0430 08/28/16 1700 09/01/16 0423  Weight: 204 lb 3.2 oz (92.6 kg) 204 lb 2.3 oz (92.6 kg) 214 lb 1.1 oz (97.1 kg)    Telemetry    Sinus rhythm with first degree AV-block  - Personally Reviewed   Physical Exam   GEN: Well nourished, well developed  Neck: no JVD, carotid bruits, or masses Cardiac: RRR. no murmurs, rubs, or gallops,no edema. Intact distal  pulses bilaterally.  Respiratory: clear to auscultation bilaterally, normal work of breathing Skin: warm and dry, no rash Neuro: Alert  Psych:   Full affect  Labs    Chemistry Recent Labs Lab 09/01/16 0303 09/02/16 0452 09/04/16 0808  NA 139 137 140  K 3.2* 3.4* 3.4*  CL 110 107 108  CO2 22 25 25   GLUCOSE 151* 130* 134*  BUN 12 13 9   CREATININE 1.28* 1.45* 1.24  CALCIUM 8.0* 8.0* 8.2*  PROT 6.3* 5.9* 6.0*  ALBUMIN 2.7* 2.7* 2.5*  AST 21 34 72*  ALT 19 30 94*  ALKPHOS 58 69 116  BILITOT 0.6 0.7 0.6  GFRNONAA 50* 43* 52*  GFRAA 58* 50* >60  ANIONGAP 7 5 7      Hematology Recent Labs Lab 09/02/16 0452 09/03/16 0205 09/04/16 0808  WBC 9.1 11.5* 14.9*  RBC 3.13* 3.11* 3.14*  HGB 9.4* 9.2* 9.2*  HCT 27.7* 27.5* 27.8*  MCV 88.5 88.4 88.5  MCH 30.0 29.6 29.3  MCHC 33.9 33.5 33.1  RDW 15.5 15.7* 15.5  PLT 234 243 272    Cardiac EnzymesNo results for input(s): TROPONINI in the last 168 hours. No results  for input(s): TROPIPOC in the last 168 hours.   BNPNo results for input(s): BNP, PROBNP in the last 168 hours.   DDimer No results for input(s): DDIMER in the last 168 hours.   Radiology    Dg Chest Port 1 View  Result Date: 09/02/2016 CLINICAL DATA:  PICC line placement EXAM: PORTABLE CHEST 1 VIEW COMPARISON:  09/01/2016 FINDINGS: 1643 hours. Low volume film with basilar atelectasis. No overt edema. The cardio pericardial silhouette is enlarged. Right PICC line is new in the interval with the tip positioned over the mid SVC level. Telemetry leads overlie the chest. IMPRESSION: Right PICC line tip overlies the mid SVC level. Electronically Signed   By: Misty Stanley M.D.   On: 09/02/2016 17:20   Dg Abd Acute W/chest  Result Date: 09/03/2016 CLINICAL DATA:  Increased white blood cell count. EXAM: DG ABDOMEN ACUTE W/ 1V CHEST COMPARISON:  09/02/2016 chest x-ray.  Abdomen study from 09/01/2016. FINDINGS: Low lung volumes without edema or focal airspace consolidation.  Atelectasis noted at the lung bases. Cardiopericardial silhouette is at upper limits of normal for size. Right PICC line tip overlies the mid to distal SVC level. The visualized bony structures of the thorax are intact. Large gastric bubble noted. Diffuse gaseous small bowel distension persists and is similar to prior. Small bowel loops in the central abdomen measure up to 5 cm diameter which is not substantially changed. Stool and gas are noted in the right colon. IMPRESSION: 1. Low lung volumes with basilar atelectasis. 2. Prominent gastric bubble with diffuse gaseous small bowel dilatation, similar to the study from 09/01/2016. Postop day 6 for resection of colon mass suggests findings are related to postoperative adynamic ileus. Electronically Signed   By: Misty Stanley M.D.   On: 09/03/2016 09:00    Cardiac Studies   Echo 08/23/16  Study Conclusions - Procedure narrative: Transthoracic echocardiography. Image quality was poor. The study was technically difficult, as a result of poor sound wave transmission. - Left ventricle: The cavity size was normal. Wall thickness was increased in a pattern of mild LVH. Systolic function was normal. The estimated ejection fraction was in the range of 60% to 65%. Although no diagnostic regional wall motion abnormality was identified, this possibility cannot be completely excluded on the basis of this study. Features are consistent with a pseudonormal left ventricular filling pattern, with concomitant abnormal relaxation and increased filling pressure (grade 2 diastolic dysfunction). - Aortic valve: There was no stenosis. - Mitral valve: Mildly calcified annulus. There was trivial regurgitation. - Left atrium: The atrium was mildly to moderately dilated. - Right ventricle: The cavity size was normal. Systolic function was normal. - Pulmonary arteries: No complete TR doppler jet so unable to estimate PA systolic pressure. -  Inferior vena cava: The vessel was normal in size. The respirophasic diameter changes were in the normal range (>= 50%), consistent with normal central venous pressure.  Impressions: - Normal LV size with mild LV hypertrophy. EF 60-65%. Moderate diastolic dysfunction. Normal RV size and systolic function. Moderate LAE. No significant valvular abnormalities.   Patient Profile    83 y.o. male with PAF CHAD2VASC 3 on xarelto at home [place] of afib/flutter ablation 2012 Normal EF 60-65% no history of CAD Unclear reactions to a combination of meds at [facility] Years ago and listed as intolerence to cardizem beta blocker and propafenone. "Looked like he was burnt"  S/P right colectomy 08/28/16    Assessment & Plan     1. PAF: Has remained  in sinus rhythm with first degree block.  Restart anticoagulation with Xarelto when okay with surgery. On Amiodarone 200 mg BID. Does not take antiarrhythmic at home (per chart review, pt does not remember his home meds).   CHAD2VASC 3 on xarelto (age/1, diabetes/1, HTN/1)  2. Hypokalemia: potassium still mildly low.  3. S/p abd surgery: Surgery managing   Signed, Linus Mako, PA-C  09/04/2016, 11:14 AM

## 2016-09-04 NOTE — Progress Notes (Signed)
Physical Therapy Treatment Patient Details Name: Bradley Short MRN: [id number] DOB: Jan 18, 1933 Today's Date: 09/04/2016    History of Present Illness 83 year old male who was admitted to the hospital with acute lower GI bleeding.  Pt with hx of chronic atrial fibrillation and is on chronic anticoagulation. Pt underwent a colonoscopy which demonstrated diverticular disease (this is felt to be the source of his bleeding) (and a incidentally found neoplasm in the cecum at the appendiceal orifice that was suspicious for malignancy and pt with plans for lap assisted R colectomy Monday     PT Comments    The patient increased distance today. Requires steady assist  For balance, extra time for ambulation. Continue PT while in acute .  Follow Up Recommendations  SNF     Equipment Recommendations  None recommended by PT    Recommendations for Other Services       Precautions / Restrictions Precautions Precautions: Fall    Mobility  Bed Mobility   Bed Mobility: Sit to Supine       Sit to supine: Min assist   General bed mobility comments: min assist for legs onto the bed.  Transfers Overall transfer level: Needs assistance Equipment used: Rolling walker (2 wheeled) Transfers: Sit to/from Stand Sit to Stand: Min assist         General transfer comment: verbal cues for hand placement, use of rail from toilet  Ambulation/Gait   Ambulation Distance (Feet): 400 Feet Assistive device: Rolling walker (2 wheeled) Gait Pattern/deviations: Step-through pattern;Decreased step length - right;Decreased step length - left;Shuffle;Trunk flexed Gait velocity: decr Gait velocity interpretation: <1.8 ft/sec, indicative of risk for recurrent falls General Gait Details: cues for posture and position from RW.  Increased time ,cues for safe turning with RW   Stairs            Wheelchair Mobility    Modified Rankin (Stroke Patients Only)       Balance   Sitting-balance  support: Single extremity supported;Feet supported Sitting balance-Leahy Scale: Fair     Standing balance support: Bilateral upper extremity supported Standing balance-Leahy Scale: Poor                              Cognition Arousal/Alertness: Awake/alert   Overall Cognitive Status: Impaired/Different from baseline                                 General Comments: requires frequent cues, is very methodical and verbalizes each step of mobility- "I am turning, I am backing up, etc.      Exercises      General Comments        Pertinent Vitals/Pain Pain Assessment: No/denies pain    Home Living                      Prior Function            PT Goals (current goals can now be found in the care plan section) Progress towards PT goals: Progressing toward goals    Frequency    Min 3X/week      PT Plan Current plan remains appropriate    Co-evaluation              AM-PAC PT "6 Clicks" Daily Activity  Outcome Measure  Difficulty turning over in bed (including adjusting bedclothes, sheets and blankets)?: Total Difficulty moving  from lying on back to sitting on the side of the bed? : Total Difficulty sitting down on and standing up from a chair with arms (e.g., wheelchair, bedside commode, etc,.)?: Total Help needed moving to and from a bed to chair (including a wheelchair)?: Total Help needed walking in hospital room?: Total Help needed climbing 3-5 steps with a railing? : Total 6 Click Score: 6    End of Session Equipment Utilized During Treatment: Gait belt Activity Tolerance: Patient tolerated treatment well Patient left: in bed;with call bell/phone within reach;with family/visitor present Nurse Communication: Mobility status       Time: 9622-2979 PT Time Calculation (min) (ACUTE ONLY): 34 min  Charges:  $Gait Training: 23-37 mins                    G CodesTresa Endo PT [phone]    Claretha Cooper 09/04/2016, 2:24 PM

## 2016-09-05 ENCOUNTER — Inpatient Hospital Stay (HOSPITAL_COMMUNITY): Payer: Medicare Other

## 2016-09-05 DIAGNOSIS — Z9049 Acquired absence of other specified parts of digestive tract: Secondary | ICD-10-CM

## 2016-09-05 DIAGNOSIS — Z833 Family history of diabetes mellitus: Secondary | ICD-10-CM

## 2016-09-05 DIAGNOSIS — K567 Ileus, unspecified: Secondary | ICD-10-CM

## 2016-09-05 DIAGNOSIS — I4891 Unspecified atrial fibrillation: Secondary | ICD-10-CM

## 2016-09-05 DIAGNOSIS — Z8042 Family history of malignant neoplasm of prostate: Secondary | ICD-10-CM

## 2016-09-05 DIAGNOSIS — Z8719 Personal history of other diseases of the digestive system: Secondary | ICD-10-CM

## 2016-09-05 DIAGNOSIS — Z809 Family history of malignant neoplasm, unspecified: Secondary | ICD-10-CM

## 2016-09-05 DIAGNOSIS — Z820 Family history of epilepsy and other diseases of the nervous system: Secondary | ICD-10-CM

## 2016-09-05 DIAGNOSIS — R21 Rash and other nonspecific skin eruption: Secondary | ICD-10-CM

## 2016-09-05 LAB — CBC
HCT: 27.7 % — ABNORMAL LOW (ref 39.0–52.0)
Hemoglobin: 9.2 g/dL — ABNORMAL LOW (ref 13.0–17.0)
MCH: 29.4 pg (ref 26.0–34.0)
MCHC: 33.2 g/dL (ref 30.0–36.0)
MCV: 88.5 fL (ref 78.0–100.0)
Platelets: 312 10*3/uL (ref 150–400)
RBC: 3.13 MIL/uL — ABNORMAL LOW (ref 4.22–5.81)
RDW: 15.6 % — AB (ref 11.5–15.5)
WBC: 17.9 10*3/uL — ABNORMAL HIGH (ref 4.0–10.5)

## 2016-09-05 LAB — BASIC METABOLIC PANEL
Anion gap: 10 (ref 5–15)
BUN: 9 mg/dL (ref 6–20)
CALCIUM: 8.3 mg/dL — AB (ref 8.9–10.3)
CO2: 24 mmol/L (ref 22–32)
CREATININE: 1.22 mg/dL (ref 0.61–1.24)
Chloride: 107 mmol/L (ref 101–111)
GFR calc non Af Amer: 53 mL/min — ABNORMAL LOW (ref >60)
Glucose, Bld: 142 mg/dL — ABNORMAL HIGH (ref 65–99)
Potassium: 3.2 mmol/L — ABNORMAL LOW (ref 3.5–5.1)
Sodium: 141 mmol/L (ref 135–145)

## 2016-09-05 LAB — GLUCOSE, CAPILLARY
GLUCOSE-CAPILLARY: 138 mg/dL — AB (ref 65–99)
Glucose-Capillary: 161 mg/dL — ABNORMAL HIGH (ref 65–99)
Glucose-Capillary: 106 mg/dL — ABNORMAL HIGH (ref 65–99)

## 2016-09-05 LAB — DIFFERENTIAL
Basophils Absolute: 0 10*3/uL (ref 0.0–0.1)
Basophils Relative: 0 %
EOS PCT: 4 %
Eosinophils Absolute: 0.7 10*3/uL (ref 0.0–0.7)
Lymphocytes Relative: 8 %
Lymphs Abs: 1.4 10*3/uL (ref 0.7–4.0)
MONOS PCT: 7 %
Monocytes Absolute: 1.3 10*3/uL — ABNORMAL HIGH (ref 0.1–1.0)
Neutro Abs: 14.5 10*3/uL — ABNORMAL HIGH (ref 1.7–7.7)
Neutrophils Relative %: 81 %

## 2016-09-05 LAB — HEPARIN LEVEL (UNFRACTIONATED): Heparin Unfractionated: 0.42 IU/mL (ref 0.30–0.70)

## 2016-09-05 MED ORDER — ENSURE ENLIVE PO LIQD
237.0000 mL | Freq: Two times a day (BID) | ORAL | Status: DC
  Administered 2016-09-05 – 2016-09-12 (×10): 237 mL via ORAL

## 2016-09-05 MED ORDER — POTASSIUM CHLORIDE 20 MEQ/15ML (10%) PO SOLN: 40.0000 meq | ORAL | Status: DC

## 2016-09-05 MED ORDER — HEPARIN (PORCINE) IN NACL 100-0.45 UNIT/ML-% IJ SOLN
1750.0000 [IU]/h | INTRAMUSCULAR | Status: AC
  Administered 2016-09-05: 1750 [IU]/h via INTRAVENOUS

## 2016-09-05 MED ORDER — RIVAROXABAN 20 MG PO TABS
20.0000 mg | ORAL_TABLET | Freq: Every day | ORAL | Status: DC
  Administered 2016-09-05 – 2016-09-11 (×7): 20 mg via ORAL
  Filled 2016-09-05 (×7): qty 1

## 2016-09-05 MED ORDER — CEPHALEXIN 500 MG PO CAPS: 500.0000 mg | ORAL_CAPSULE | Freq: Two times a day (BID) | ORAL | Status: DC

## 2016-09-05 MED ORDER — FUROSEMIDE 10 MG/ML IJ SOLN
40.0000 mg | Freq: Once | INTRAMUSCULAR | Status: AC
  Administered 2016-09-05: 40 mg via INTRAVENOUS
  Filled 2016-09-05: qty 4

## 2016-09-05 MED ORDER — POTASSIUM CHLORIDE CRYS ER 20 MEQ PO TBCR
40.0000 meq | EXTENDED_RELEASE_TABLET | Freq: Once | ORAL | Status: AC
  Administered 2016-09-05: 40 meq via ORAL
  Filled 2016-09-05: qty 2

## 2016-09-05 MED ORDER — CEPHALEXIN 500 MG PO CAPS
500.0000 mg | ORAL_CAPSULE | Freq: Four times a day (QID) | ORAL | Status: DC
  Administered 2016-09-05 – 2016-09-06 (×4): 500 mg via ORAL
  Filled 2016-09-05 (×4): qty 1

## 2016-09-05 NOTE — Progress Notes (Addendum)
PROGRESS NOTE   Bradley Short  TFT:[id number]    DOB: 1932/06/30    DOA: 08/21/2016  PCP: Manon Hilding, MD   I have briefly reviewed patients previous medical records in [place].  Brief Narrative:   84 PAF on Xarelto, HLD,  OSA on CPAP gout,  presented with multiple episodes of painless BRBPR, weakness, diaphoresis, went to [facility] ED where hemoglobin around 12 g, initially A. fib with RVR which improved with IV fluids  CT abdomen and pelvis showed sigmoid diverticulosis without acute findings,   transferred to [facility] stepdown unit. Hemodynamically and hemoglobin stable.  [facility] consulted and patient underwent colonoscopy 6/28.  Cecal lesion noted-Gen. surgery consulted  Cardiology provided preop clearance and made recommendations regarding A. fib management that patient developed overnight prior to surgery.  Patient has progressed well and underwent surgery on 7/2 with a right colectomy His postop course was complicated by tachycardia likely worsened by his anxiety and he was on IV amiodarone as well as IV Cardizem He is now off of Cardizem and has been loaded with amiodarone He is 7 L and is getting better except for a rash that has appeared since 7/8 Infectious disease will be seeing the patient in consult and we will consider skin biopsy of the edge of the rash to determine if this is a paraneoplastic syndrome versus just simple allergic reaction-general surgery will be made aware of the same and appreciate this input in advance  Assessment & Plan:   Principal Problem:   Acute GI bleeding Active Problems:   Pure hypercholesterolemia   ATRIAL FIBRILLATION   Acute blood loss anemia   Gout   Leukocytosis   Thrombocytopenia (HCC)   CKD (chronic kidney disease), stage III   Syncope, vasovagal   H/O colectomy   Lower extremity edema  Acute blood loss anemia: Secondary to GI bleed. Stable. Acute GI bleed, suspect LGIB:    Cecal mass s/p R Colectomy 7/2-HIGH GRADE DYSPLASIA AND SMALL FOCUS  SUSPICIOUS FOR ADENOCARCINOMA. Postop ileus on x-ray 7/6  Preop CT abdomen without contrast 7/1: Sigmoid diverticulosis without acute  diverticulitis. No acute findings  [facility] follow-up, bleeding source likely diverticular in origin and has clinically  resolved.   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   Abdominal x-ray does not currently changed resolution of ileus 7/8   TPN started 7/8 through PICC line--appreciate pharmacist input--on 7/10 with discontinued lipids  General surgery appears to cut back the rate of TPN to half on 7/10   CT scan 7/9 Shows no active concerns for dehiscence--shows expected with post-up fluid consistent with recent surgery  Rash Leukocytosis  No new medications, vesicular and mainly over her lower extremities--? Secondary to lipids and TPN  Looks more like heat rash but cannot be sure as patient has now a rising leukocytosis   We consulted Dr. Linus Salmons of infectious disease and appreciated in advance his recommendations  It appears that the patient has been started on Keflex by general surgery for periwound cellulitis? I did not visualize the wound this morning   ? Could the patient have a paraneoplastic-type syndrome with the rash-patient did have high-grade dysplasia   Await input-might benefit from skin biopsy--DDx Amiodarone 2-26% related?  Consider Oral steroids if ok with gen surgery?  Bipolar   On Clonopin ~ 3 yrs prior  Situational stressors of surgery and cancer  Increased ativan to 0.5-->1 q6 scheduled 7/8 as anxious still  Starting Buspar  5 bid--hold given ileus 7/6   Thrombocytopenia: Secondary to acute blood loss. Resolved. Leukocytosis: Likely stress response-see above  Syncope:   ? vasovagal due to acute GI bleed. Telemetry without significant arrhythmias as cause.  2-D echo: LVEF 60-65 percent. Moderate diastolic dysfunction. No  significant valvular abnormalities. Mobilize. PT recommends no follow-up at this time. Paroxysmal atrial fibrillation with RVR-  back to sinus rhythm 7/8 while on antiarrhythmic  Not on rate control medications PTA.   Cardiology indicated low to moderate risk for surgery. Developed A. fib with RVR in the  170s overnight 7/1.  Dr H Discussed with Dr. Johnsie Cancel - adding IV amiodarone given    Cardizem gtt/amiodarone GTT-->7/9 amiodarone 200 ii/day X 6 doses, daily from 7/11  Appreciated further input from cardiology   Heparin has been discontinued in favor of Xarelto 7/10 by cardiology  Lasix given 20 daily by mouth as [+] 7-9--- because he has some arm swelling have given him a one-time dose of IV Lasix 7/10  We will weigh the patient daily   Hyperlipidemia: Resumed medications post colonoscopy. Gout: No acute flare. Continue allopurinol.  Hypophosphatemia/hypomagensiema:  Stage III chronic kidney disease:   Baseline creatinine not known. Creatinine stable in the 1.2-1.3 range.  Replaced.  ICU electrolyte protocol periodic labs,      DVT prophylaxis: SCDs. Code Status: Full Family Communication:  Disposition:   Consultants:  [facility]  Gen. Surgery Cardiology  Procedures:  Colonoscopy 08/24/16: Impression:               - Preparation of the colon was fair.                           - One 7 mm polyp in the cecum, removed with a hot                            snare. Complete resection. Polyp tissue not                            retrieved.                           - Likely malignant tumor in the cecum and at the                            appendiceal orifice. Biopsied. Tattooed.                           - Diverticulosis in the sigmoid colon, in the                            descending colon and at the splenic flexure.                           - Non-bleeding external hemorrhoids.                           - The examination was otherwise normal.  Underwent R lap assist  Hemicolectomy 7/2-Dr. Marlou Starks  Pathology results:  Diagnosis Ileocecal valve, biopsy, mass - SESSILE SERRATED POLYP WITH HIGH GRADE DYSPLASIA AND SMALL FOCUS SUSPICIOUS FOR ADENOCARCINOMA.  Antimicrobials:  None    Subjective:  Alert pleasant oriented Rash continues to spread and is now under the arm He says it is itchy There've been no new medications that we have really started-I think at some point in the past he might of had a reaction to one of the antiarrhythmics and I'm not sure if amiodarone is causing this He is eating and drinking fairly well and his TPN has been halvwed   Objective:  Vitals:   09/04/16 1436 09/04/16 2143 09/05/16 0618 09/05/16 1300  BP: (!) 133/53 (!) 142/55 (!) 126/49 (!) 116/45  Pulse: 64 77 77 78  Resp: 17 18 18 18   Temp: 97.7 F (36.5 C) 97.9 F (36.6 C) 98.2 F (36.8 C) 97.6 F (36.4 C)  TempSrc: Oral Oral Oral Oral  SpO2: 100% 100% 97% 99%  Weight:      Height:        Examination:  Looks much more relaxed and calm Chest is clear without any rails or rhonchi Good air entry S1-S2 no murmur rub or gallop seems to be in sinus rhythm Abdomen is soft nontender however he does have a fine vesicular rash over the legs and abdomen and arms which seems to spread He is lotioned up pretty well No lower extremity edema Left arm appear swollen little bit more than yesterday  Data Reviewed: I have personally reviewed following labs and imaging studies  CBC:  Recent Labs Lab 09/01/16 0303 09/02/16 0452 09/03/16 0205 09/04/16 0808 09/05/16 0448  WBC 8.3 9.1 11.5* 14.9* 17.9*  NEUTROABS 6.0 6.5  --  12.1* 14.5*  HGB 10.2* 9.4* 9.2* 9.2* 9.2*  HCT 30.2* 27.7* 27.5* 27.8* 27.7*  MCV 88.8 88.5 88.4 88.5 88.5  PLT 240 234 243 272 174   Basic Metabolic Panel:  Recent Labs Lab 08/30/16 0300 08/31/16 0853 09/01/16 0303 09/02/16 0452 09/04/16 0808 09/05/16 0448  NA  --  138 139 137 140 141  K  --  3.6 3.2* 3.4* 3.4* 3.2*  CL  --   107 110 107 108 107  CO2  --  24 22 25 25 24   GLUCOSE  --  141* 151* 130* 134* 142*  BUN  --  14 12 13 9 9   CREATININE  --  1.41* 1.28* 1.45* 1.24 1.22  CALCIUM  --  8.3* 8.0* 8.0* 8.2* 8.3*  MG 2.1  --  2.2 2.3 2.3  --   PHOS 2.2*  --   --  2.8 2.8  --    Liver Function Tests:  Recent Labs Lab 09/01/16 0303 09/02/16 0452 09/04/16 0808  AST 21 34 72*  ALT 19 30 94*  ALKPHOS 58 69 116  BILITOT 0.6 0.7 0.6  PROT 6.3* 5.9* 6.0*  ALBUMIN 2.7* 2.7* 2.5*   Coagulation Profile: No results for input(s): INR, PROTIME in the last 168 hours. CBG:  Recent Labs Lab 09/04/16 1148 09/04/16 1829 09/05/16 0011 09/05/16 0618 09/05/16 1237  GLUCAP 119* 131* 138* 161* 106*    Radiology Studies:  reviewed up to date current imaging for today    LOS: 15 days    Verneita Griffes, MD [facility] [phone]   09/05/2016, 3:53 PM

## 2016-09-05 NOTE — Progress Notes (Signed)
Progress Note  Patient Name: Bradley Short Date of Encounter: 09/05/2016  Primary Cardiologist: Allred/Hochrein  Subjective   Feeling better. Sitting up in chair. Wife at bedside. No CP or dyspnea. TPN started. Still on IV heparin.  Inpatient Medications    Scheduled Meds: . amiodarone  200 mg Oral BID  . [START ON 09/06/2016] amiodarone  200 mg Oral Daily  . calamine   Topical BID  . furosemide  20 mg Oral Daily  . insulin aspart  0-9 Units Subcutaneous Q6H   Continuous Infusions: . dextrose 5 % and 0.45% NaCl 30 mL/hr at 09/05/16 0344  . heparin 1,750 Units/hr (09/05/16 0344)  . Marland KitchenTPN (CLINIMIX-E) Adult 83 mL/hr at 09/04/16 1827   PRN Meds: hydrOXYzine, iopamidol, LORazepam, [DISCONTINUED] ondansetron **OR** ondansetron (ZOFRAN) IV, sodium chloride flush   Vital Signs    Vitals:   09/04/16 0602 09/04/16 1436 09/04/16 2143 09/05/16 0618  BP: (!) 135/55 (!) 133/53 (!) 142/55 (!) 126/49  Pulse: 72 64 77 77  Resp: 18 17 18 18   Temp: 98 F (36.7 C) 97.7 F (36.5 C) 97.9 F (36.6 C) 98.2 F (36.8 C)  TempSrc: Oral Oral Oral Oral  SpO2: 98% 100% 100% 97%  Weight:      Height:        Intake/Output Summary (Last 24 hours) at 09/05/16 4166 Last data filed at 09/05/16 0600  Gross per 24 hour  Intake          2049.32 ml  Output             2575 ml  Net          -525.68 ml   Filed Weights   08/26/16 0430 08/28/16 1700 09/01/16 0423  Weight: 204 lb 3.2 oz (92.6 kg) 204 lb 2.3 oz (92.6 kg) 214 lb 1.1 oz (97.1 kg)    Telemetry    NSR 70's no AF/SVT/ventricular beats - Personally Reviewed   Physical Exam  Pleasant elderly male in NAD GEN: No acute distress.   Neck: No JVD Cardiac: RRR 1/6 SEM at the RUSB no rubs or gallops.  Respiratory: Clear to auscultation bilaterally. GI: Soft, nontender, non-distended  MS: 1+ bilateral ankle edema; No deformity. Support hose in place Neuro:  Nonfocal  Psych: Normal affect   Labs    Chemistry Recent Labs Lab  09/01/16 0303 09/02/16 0452 09/04/16 0808 09/05/16 0448  NA 139 137 140 141  K 3.2* 3.4* 3.4* 3.2*  CL 110 107 108 107  CO2 22 25 25 24   GLUCOSE 151* 130* 134* 142*  BUN 12 13 9 9   CREATININE 1.28* 1.45* 1.24 1.22  CALCIUM 8.0* 8.0* 8.2* 8.3*  PROT 6.3* 5.9* 6.0*  --   ALBUMIN 2.7* 2.7* 2.5*  --   AST 21 34 72*  --   ALT 19 30 94*  --   ALKPHOS 58 69 116  --   BILITOT 0.6 0.7 0.6  --   GFRNONAA 50* 43* 52* 53*  GFRAA 58* 50* >60 >60  ANIONGAP 7 5 7 10      Hematology Recent Labs Lab 09/03/16 0205 09/04/16 0808 09/05/16 0448  WBC 11.5* 14.9* 17.9*  RBC 3.11* 3.14* 3.13*  HGB 9.2* 9.2* 9.2*  HCT 27.5* 27.8* 27.7*  MCV 88.4 88.5 88.5  MCH 29.6 29.3 29.4  MCHC 33.5 33.1 33.2  RDW 15.7* 15.5 15.6*  PLT 243 272 312    Cardiac EnzymesNo results for input(s): TROPONINI in the last 168 hours. No results for input(s):  TROPIPOC in the last 168 hours.   BNPNo results for input(s): BNP, PROBNP in the last 168 hours.   DDimer No results for input(s): DDIMER in the last 168 hours.   Radiology    Ct Abdomen Pelvis W Contrast  Result Date: 09/04/2016 CLINICAL DATA:  83 y/o M; status post laparoscopic assisted right colectomy 08/28/2016. Leukocytosis. Evaluate for postoperative complications. EXAM: CT ABDOMEN AND PELVIS WITH CONTRAST TECHNIQUE: Multidetector CT imaging of the abdomen and pelvis was performed using the standard protocol following bolus administration of intravenous contrast. CONTRAST:  59mL ISOVUE-300 IOPAMIDOL (ISOVUE-300) INJECTION 61%, 111mL ISOVUE-300 IOPAMIDOL (ISOVUE-300) INJECTION 61% COMPARISON:  08/27/2016 CT of the abdomen and pelvis. FINDINGS: Lower chest: Minor atelectasis in the lingula and bilateral lower lobes. Moderate to severe coronary artery calcification. Mild cardiomegaly. Hepatobiliary: No focal liver abnormality is seen. No gallstones, gallbladder wall thickening, or biliary dilatation. Pancreas: Unremarkable. No pancreatic ductal dilatation or  surrounding inflammatory changes. Spleen: Normal in size without focal abnormality. Adrenals/Urinary Tract: Normal adrenal glands. Numerous parapelvic cysts of the kidneys bilaterally. No hydronephrosis. Normal bladder. Stomach/Bowel: Right partial colectomy. The sigmoid colon and rectum are diffusely featureless with mild wall thickening. Moderate proximal sigmoid diverticulosis. Patent enterocolic anastomosis without evidence for bowel obstruction. Oral contrast extends to the rectum. Peritoneal fluid collections: 1. Anterior and superior to the anastomosis measuring 44 x 21 x 38 mm (AP x ML x CC ), series 2, image 45. 2. Posterior to the anastomosis measuring 34 x 15 x 46 mm, series 2, image 49. 3. Pelvis posterior to the bladder measuring 38 x 27 x 41 mm, series 5, image 73. There is faint enhancement at the margin of the collection posterior to the anastomosis but no definite organization into anabscess. Vascular/Lymphatic: Aortic atherosclerosis. No enlarged abdominal or pelvic lymph nodes. Reproductive: Mild prostate enlargement. Other: Postsurgical changes in the midline anterior abdominal wall without fluid collection. Musculoskeletal: No fracture is seen. IMPRESSION: 1. Small fluid collections are present surrounding the right lower quadrant enterocolic anastomosis and within the pelvis. No discrete abscess. 2. Distal sigmoid colon and rectum are featureless with wall thickening compatible with colitis. Electronically Signed   By: Kristine Garbe M.D.   On: 09/04/2016 20:43   Dg Abd Acute W/chest  Result Date: 09/03/2016 CLINICAL DATA:  Increased white blood cell count. EXAM: DG ABDOMEN ACUTE W/ 1V CHEST COMPARISON:  09/02/2016 chest x-ray.  Abdomen study from 09/01/2016. FINDINGS: Low lung volumes without edema or focal airspace consolidation. Atelectasis noted at the lung bases. Cardiopericardial silhouette is at upper limits of normal for size. Right PICC line tip overlies the mid to distal  SVC level. The visualized bony structures of the thorax are intact. Large gastric bubble noted. Diffuse gaseous small bowel distension persists and is similar to prior. Small bowel loops in the central abdomen measure up to 5 cm diameter which is not substantially changed. Stool and gas are noted in the right colon. IMPRESSION: 1. Low lung volumes with basilar atelectasis. 2. Prominent gastric bubble with diffuse gaseous small bowel dilatation, similar to the study from 09/01/2016. Postop day 6 for resection of colon mass suggests findings are related to postoperative adynamic ileus. Electronically Signed   By: Misty Stanley M.D.   On: 09/03/2016 09:00    Patient Profile     83 y.o. male with PAF CHAD2VASC 3 on xarelto at home history of afib/flutter ablation 2012 Normal EF 60-65% no history of CADS/P rightcolectomy 08/28/16 with episodes of AF with RVR  Assessment &  Plan    1. Paroxysmal atrial fibrillation with RVR: stabilized and tolerating oral amiodarone, maintaining sinus rhythm. Normal LV function by echo. On heparin in peri-operative period, was taking Xarelto at home. Switch back to Xarelto when ok with the surgical team. Amiodarone loading as per yesterday's note of Dr Oval Linsey (400 mg BID decrease to 200 mg daily tomorrow).  2. Hypokalemia: mild, per primary team  3. Volume excess: on oral furosemide at low dose.   Overall stable from CV perspective. Continue current Rx as above.   Deatra James, MD  09/05/2016, 6:39 AM

## 2016-09-05 NOTE — Consult Note (Signed)
[place] for Infectious Disease       Reason for Consult: rash, leukocytosis    Referring Physician: Dr. Verlon Au  Principal Problem:   Acute GI bleeding Active Problems:   Pure hypercholesterolemia   ATRIAL FIBRILLATION   Acute blood loss anemia   Gout   Leukocytosis   Thrombocytopenia (HCC)   CKD (chronic kidney disease), stage III   Syncope, vasovagal   H/O colectomy   Lower extremity edema   . amiodarone  200 mg Oral BID  . [START ON 09/06/2016] amiodarone  200 mg Oral Daily  . calamine   Topical BID  . cephALEXin  500 mg Oral Q6H  . feeding supplement (ENSURE ENLIVE)  237 mL Oral BID BM  . rivaroxaban  20 mg Oral Q supper    Recommendations: Observe off of antibiotics   Assessment: He has a rash that is pruritic of unknown etiology.  It does appear to look most c/w a drug rash and he has received several new medications including amiodarone, Cardizem, Reglan.  It is improving per the patient so no indication for any treatment.  There is a mild eosinophilia on the differential as well which goes with that.  It is improving so I do not feel a biopsy is indicated from an ID standpoint Leukocytosis - is up but not associated with any new symptoms, no fever.  I do not feel cephalexin is indicated.  Continue to observe  Antibiotics: Cephalexin.  HPI: 69 Grenz is a 83 y.o. male with afib, gout and developed rectal bleeding and found a cecal mass and underwent colectomy which is suspicious for adenocarcinoma.  He subsequently developed a post op ileus.  This is improving but he did require TPN which he continues to get.  His WBC increased yesterday to 17.9.  He is having no fever, no chills.  No sob.  He is eating more, tolerating po intake.  No sob, no significant diarrhea.  Feels like he is getting better.     Review of Systems:  Constitutional: negative for fevers and chills Cardiovascular: negative for dyspnea Integument/breast: negative for rash and  pruritus All other systems reviewed and are negative    Past Medical History:  Diagnosis Date  . Atrial fibrillation (HCC)    paroxysmal, s/p PVI by Dr Rayann Heman 8/12  . Atrial flutter ([place])    s/p CTI ablation  . Hyperlipidemia   . Obstructive sleep apnea (adult) (pediatric)    on CPAP    Social History  Substance Use Topics  . Smoking status: Never Smoker  . Smokeless tobacco: Never Used  . Alcohol use No    Family History  Problem Relation Age of Onset  . Prostate cancer Father   . Seizures Other   . Cancer Other   . Diabetes Other   . Heart disease Other     Allergies  Allergen Reactions  . Diltiazem Hcl     REACTION: Looked like burned all over  . Lisinopril     REACTION: Looked like burned all over  . Metoprolol Succinate     REACTION: Looked like burned all over  . Propafenone Hcl     REACTION: Looked like burned all over    Physical Exam: Constitutional: in no apparent distress and alert  Vitals:   09/05/16 0618 09/05/16 1300  BP: (!) 126/49 (!) 116/45  Pulse: 77 78  Resp: 18 18  Temp: 98.2 F (36.8 C) 97.6 F (36.4 C)   EYES: anicteric ENMT:  no thrush Cardiovascular: Cor RRR Respiratory: CTA B; normal respiratory effort GI: + incision without surrounding erythema, no discharge Musculoskeletal: no pedal edema noted Skin: papillary rash diffusely on torso, arms, legs Hematologic: no cervical lad  Lab Results  Component Value Date   WBC 17.9 (H) 09/05/2016   HGB 9.2 (L) 09/05/2016   HCT 27.7 (L) 09/05/2016   MCV 88.5 09/05/2016   PLT 312 09/05/2016    Lab Results  Component Value Date   CREATININE 1.22 09/05/2016   BUN 9 09/05/2016   NA 141 09/05/2016   K 3.2 (L) 09/05/2016   CL 107 09/05/2016   CO2 24 09/05/2016    Lab Results  Component Value Date   ALT 94 (H) 09/04/2016   AST 72 (H) 09/04/2016   ALKPHOS 116 09/04/2016     Microbiology: Recent Results (from the past 240 hour(s))  Surgical pcr screen     Status: None    Collection Time: 08/28/16  6:46 AM  Result Value Ref Range Status   MRSA, PCR NEGATIVE NEGATIVE Final   Staphylococcus aureus NEGATIVE NEGATIVE Final    Comment:        The Xpert SA Assay (FDA approved for NASAL specimens in patients over 73 years of age), is one component of a comprehensive surveillance program.  Test performance has been validated by [facility] for patients greater than or equal to 61 year old. It is not intended to diagnose infection nor to guide or monitor treatment.   C difficile quick scan w PCR reflex     Status: None   Collection Time: 08/31/16  8:01 PM  Result Value Ref Range Status   C Diff antigen NEGATIVE NEGATIVE Final   C Diff toxin NEGATIVE NEGATIVE Final   C Diff interpretation No C. difficile detected.  Final    Tavon Magnussen, Herbie [place], [place] for Infectious Disease [place] [email or web address] [phone] pager  [phone] cell 09/05/2016, 6:19 PM

## 2016-09-05 NOTE — Progress Notes (Signed)
Nutrition Follow-up  INTERVENTION:   -TPN per Pharmacy -Provide Ensure Enlive po BID, each supplement provides 350 kcal and 20 grams of protein -diluted with milk per patient request  -Encourage PO intake -Provided "Fiber-restricted Nutrition Therapy" handout from [employer].  RD will continue to monitor  NUTRITION DIAGNOSIS:   Inadequate oral intake related to poor appetite as evidenced by per patient/family report.  GOAL:   Patient will meet greater than or equal to 90% of their needs  MONITOR:   PO intake, Supplement acceptance, Labs, Weight trends, I & O's (TPN)  REASON FOR ASSESSMENT:   Consult Diet education  ASSESSMENT:   83 year old male with PMH of PAF on Xarelto, HLD, OSA on CPAP, gout, presented with multiple episodes of painless BRBPR, weakness, diaphoresis, went to [facility] ED where hemoglobin around 12 g, initially A. fib with RVR which improved with IV fluids, CT abdomen and pelvis showed sigmoid diverticulosis without acute findings, transferred to [facility] stepdown unit. Hemodynamically and hemoglobin stable. [facility] consulted and patient underwent colonoscopy 6/28. Cecal lesion noted-Gen. surgery consulted and plan possible surgery 7/2. Cardiology provided preop clearance and made recommendations regarding A. fib management that patient developed overnight prior to surgery.  Patient in room with wife at bedside. Pt reports not feeling hungry at this time but his appetite has improved in general. Pt receiving TPN: Clinimix 5/20 @ 83 ml/hr w/ ILE @ 20 ml/hr x 12 hours. Plan will be for TPN to be decreased to 1/2 rate and ultimately discontinued. Pt consumed a cheese omelet with grits last night. Pt's wife states it was the first full meals he has had in 2 weeks. States he tried to eat another omelet this morning but had many distractions and his food got cold. Pt states he was feeling like having some fruit.   Pt's wife with questions regarding diet at home. Provided handout on soft diet. Reviewed acceptable foods to try. Encouraged protein supplements at home.  Pt states Ensure is too thick and rich. Suggested diluting with milk. Pt is open to trying supplements again.  Medications: IV Lasix once, D5 -.45% NaCl infusion at 30 ml/hr -provides 122 kcal Labs reviewed: CBGs: 138-161 Low K  Diet Order:  TPN (CLINIMIX-E) Adult DIET SOFT Room service appropriate? Yes; Fluid consistency: Thin  Skin:   (7/2 abdominal incision)  Last BM:  7/10  Height:   Ht Readings from Last 1 Encounters:  08/28/16 5\' 11"  (1.803 m)    Weight:   Wt Readings from Last 1 Encounters:  09/01/16 214 lb 1.1 oz (97.1 kg)    Ideal Body Weight:  78.2 kg  BMI:  Body mass index is 29.86 kg/m.  Estimated Nutritional Needs:   Kcal:  2100-2300  Protein:  110-120g  Fluid:  2.1L/day  EDUCATION NEEDS:   No education needs identified at this time  Clayton Bibles, MS, RD, LDN Pager: [phone] After Hours Pager: [phone]

## 2016-09-05 NOTE — Progress Notes (Addendum)
ANTICOAGULATION CONSULT NOTE - Follow Up Consult  Pharmacy Consult for heparin Indication: atrial fibrillation  Allergies  Allergen Reactions  . Diltiazem Hcl     REACTION: Looked like burned all over  . Lisinopril     REACTION: Looked like burned all over  . Metoprolol Succinate     REACTION: Looked like burned all over  . Propafenone Hcl     REACTION: Looked like burned all over    Patient Measurements: Height: 5\' 11"  (180.3 cm) Weight: 214 lb 1.1 oz (97.1 kg) IBW/kg (Calculated) : 75.3 Heparin Dosing Weight: 93 kg  Vital Signs: Temp: 98.2 F (36.8 C) (07/10 0618) Temp Source: Oral (07/10 0618) BP: 126/49 (07/10 0618) Pulse Rate: 77 (07/10 0618)  Labs:  Recent Labs  09/02/16 1732  09/03/16 0205  09/04/16 0808 09/04/16 1602 09/05/16 0448  HGB  --   < > 9.2*  --  9.2*  --  9.2*  HCT  --   --  27.5*  --  27.8*  --  27.7*  PLT  --   --  243  --  272  --  312  APTT 73*  --  85*  --   --   --   --   HEPARINUNFRC  --   --  0.17*  < > 0.51 0.38 0.42  CREATININE  --   --   --   --  1.24  --  1.22  < > = values in this interval not displayed.  Estimated Creatinine Clearance: 55.5 mL/min (by C-G formula based on SCr of 1.22 mg/dL).  Assessment: 64 yoM admitted on 6/25 with rectal bleeding and PMH significant for afib on chronic Xarelto anticoagulation.  Xarelto was held for colonoscopy on 6/28 and Heparin IV was started on 6/29.  No active bleeding found, but incidentally found cecal mass that was suspicious for malignancy.  Surgery completed colectomy on 7/2.  Significant events: 7/3:  Heparin drip started post-op 7/5:  Transitioned back to xarelto 7/7:  Transitioned back to heparin drip at 0900 d/t ileus and NPO status (last xarelto dose was on 7/6 at 0925).  Today, 09/05/2016: - Heparin level remains therapeutic with infusion at 1750 units/hr - Hgb low but stable, platelets WNL - No bleeding reported  Goal of Therapy:  Heparin level 0.3-0.5 units/ml (aiming for  this narrower heparin level goal range d/t high bleed risk) Monitor platelets by anticoagulation protocol: Yes   Plan:  - Continue heparin drip at 1750 units/hr - Daily CBC and heparin level while on heparin infusion  Hershal Coria 09/05/2016,7:06 AM   ADDENDUM: 09/05/2016 12:07 PM During bedside rounding, MD ordered to transition patient back to Xarelto this evening.  Placed verbal orders to discontinue heparin infusion and start Xarelto 20 mg with dinner tonight.  Hershal Coria, PharmD Pager: [phone] 09/05/2016

## 2016-09-05 NOTE — Progress Notes (Signed)
Patient ID: [id number] Aldea, male   DOB: 07/30/1932, 83 y.o.   MRN: [id number]  [facility] Progress Note  8 Days Post-Op  Subjective: CC- no complaints Sitting up in chair this morning. Excited about eating omlette for breakfast. Tolerating diet with no n/v. Had about 4 loose BM's yesterday. Denies cough, SOB, dysuria. Denies any current abdominal pain.  Objective: Vital signs in last 24 hours: Temp:  [97.7 F (36.5 C)-98.2 F (36.8 C)] 98.2 F (36.8 C) (07/10 0618) Pulse Rate:  [64-77] 77 (07/10 0618) Resp:  [17-18] 18 (07/10 0618) BP: (126-142)/(49-55) 126/49 (07/10 0618) SpO2:  [97 %-100 %] 97 % (07/10 0618) Last BM Date: 09/04/16  Intake/Output from previous day: 07/09 0701 - 07/10 0700 In: 2049.3 [I.V.:2049.3] Out: 2575 [Urine:2575] Intake/Output this shift: No intake/output data recorded.  PE: Gen: Alert, NAD, pleasant HEENT: EOM's intact, pupils equal  Card: RRR Pulm: CTAB, no W/R/R, effort normal Abd: Soft, mild distension, +BS, midlineincision C/D/I with staples intact, diffuse erythematous rash on trunk and limbs Psych: A&Ox3  Ext: 2+ pitting edema BLE, no calf tenderness  Lab Results:   Recent Labs  09/04/16 0808 09/05/16 0448  WBC 14.9* 17.9*  HGB 9.2* 9.2*  HCT 27.8* 27.7*  PLT 272 312   BMET  Recent Labs  09/04/16 0808 09/05/16 0448  NA 140 141  K 3.4* 3.2*  CL 108 107  CO2 25 24  GLUCOSE 134* 142*  BUN 9 9  CREATININE 1.24 1.22  CALCIUM 8.2* 8.3*   PT/INR No results for input(s): LABPROT, INR in the last 72 hours. CMP     Component Value Date/Time   NA 141 09/05/2016 0448   K 3.2 (L) 09/05/2016 0448   CL 107 09/05/2016 0448   CO2 24 09/05/2016 0448   GLUCOSE 142 (H) 09/05/2016 0448   BUN 9 09/05/2016 0448   CREATININE 1.22 09/05/2016 0448   CALCIUM 8.3 (L) 09/05/2016 0448   PROT 6.0 (L) 09/04/2016 0808   ALBUMIN 2.5 (L) 09/04/2016 0808   AST 72 (H) 09/04/2016 0808   ALT 94 (H) 09/04/2016 0808   ALKPHOS 116  09/04/2016 0808   BILITOT 0.6 09/04/2016 0808   GFRNONAA 53 (L) 09/05/2016 0448   GFRAA >60 09/05/2016 0448   Lipase  No results found for: LIPASE     Studies/Results: Ct Abdomen Pelvis W Contrast  Result Date: 09/04/2016 CLINICAL DATA:  83 y/o M; status post laparoscopic assisted right colectomy 08/28/2016. Leukocytosis. Evaluate for postoperative complications. EXAM: CT ABDOMEN AND PELVIS WITH CONTRAST TECHNIQUE: Multidetector CT imaging of the abdomen and pelvis was performed using the standard protocol following bolus administration of intravenous contrast. CONTRAST:  39mL ISOVUE-300 IOPAMIDOL (ISOVUE-300) INJECTION 61%, 120mL ISOVUE-300 IOPAMIDOL (ISOVUE-300) INJECTION 61% COMPARISON:  08/27/2016 CT of the abdomen and pelvis. FINDINGS: Lower chest: Minor atelectasis in the lingula and bilateral lower lobes. Moderate to severe coronary artery calcification. Mild cardiomegaly. Hepatobiliary: No focal liver abnormality is seen. No gallstones, gallbladder wall thickening, or biliary dilatation. Pancreas: Unremarkable. No pancreatic ductal dilatation or surrounding inflammatory changes. Spleen: Normal in size without focal abnormality. Adrenals/Urinary Tract: Normal adrenal glands. Numerous parapelvic cysts of the kidneys bilaterally. No hydronephrosis. Normal bladder. Stomach/Bowel: Right partial colectomy. The sigmoid colon and rectum are diffusely featureless with mild wall thickening. Moderate proximal sigmoid diverticulosis. Patent enterocolic anastomosis without evidence for bowel obstruction. Oral contrast extends to the rectum. Peritoneal fluid collections: 1. Anterior and superior to the anastomosis measuring 44 x 21 x 38 mm (AP x ML x  CC ), series 2, image 45. 2. Posterior to the anastomosis measuring 34 x 15 x 46 mm, series 2, image 49. 3. Pelvis posterior to the bladder measuring 38 x 27 x 41 mm, series 5, image 73. There is faint enhancement at the margin of the collection posterior to the  anastomosis but no definite organization into anabscess. Vascular/Lymphatic: Aortic atherosclerosis. No enlarged abdominal or pelvic lymph nodes. Reproductive: Mild prostate enlargement. Other: Postsurgical changes in the midline anterior abdominal wall without fluid collection. Musculoskeletal: No fracture is seen. IMPRESSION: 1. Small fluid collections are present surrounding the right lower quadrant enterocolic anastomosis and within the pelvis. No discrete abscess. 2. Distal sigmoid colon and rectum are featureless with wall thickening compatible with colitis. Electronically Signed   By: Kristine Garbe M.D.   On: 09/04/2016 20:43   Dg Abd Acute W/chest  Result Date: 09/03/2016 CLINICAL DATA:  Increased white blood cell count. EXAM: DG ABDOMEN ACUTE W/ 1V CHEST COMPARISON:  09/02/2016 chest x-ray.  Abdomen study from 09/01/2016. FINDINGS: Low lung volumes without edema or focal airspace consolidation. Atelectasis noted at the lung bases. Cardiopericardial silhouette is at upper limits of normal for size. Right PICC line tip overlies the mid to distal SVC level. The visualized bony structures of the thorax are intact. Large gastric bubble noted. Diffuse gaseous small bowel distension persists and is similar to prior. Small bowel loops in the central abdomen measure up to 5 cm diameter which is not substantially changed. Stool and gas are noted in the right colon. IMPRESSION: 1. Low lung volumes with basilar atelectasis. 2. Prominent gastric bubble with diffuse gaseous small bowel dilatation, similar to the study from 09/01/2016. Postop day 6 for resection of colon mass suggests findings are related to postoperative adynamic ileus. Electronically Signed   By: Misty Stanley M.D.   On: 09/03/2016 09:00    Anti-infectives: Anti-infectives    Start     Dose/Rate Route Frequency Ordered Stop   08/28/16 1133  cefoTEtan in Dextrose 5% (CEFOTAN) 2-2.08 GM-% IVPB    Comments:  Carleene Cooper   :  cabinet override      08/28/16 1133 08/28/16 1145   08/28/16 0945  cefoTEtan (CEFOTAN) 2 g in dextrose 5 % 50 mL IVPB     2 g 100 mL/hr over 30 Minutes Intravenous On call to O.R. 08/28/16 0941 08/28/16 1710       Assessment/Plan Acute lower GIB - etiology diverticular disease vs malignancy  S/P COLONOSCOPY 6/28: Cecal/appendiceal orifice mass - BX shows high grade dysplasia with small focus conerning for adenocarcinoma  S/P LAPAROSCOPIC ASSISTED RIGHT COLECTOMY 08/28/16 Dr. Autumn Messing III - POD 8 - surgical pathology: SESSILE SERRATED ADENOMA WITH HIGH GRADE DYSPLASIA, 21 BENIGN LYMPH NODES, MARGINS UNINVOLVED BY NEOPLASIA, DIVERTICULAR DISEASE, APPENDIX WITH NO DIAGNOSTIC ABNORMALITY, SMALL BOWEL WITH NO DIAGNOSTIC ABNORMALITY - CT 7/9 showed no definite abscess or leak, possibly some colitis - WBC up to 17.9 today, afebrile. CXR and CBC with diff pending. Could be due to rash? - advance to soft diet today, d/c TPN. Continue PT/ambulation. Ok to transition to oral meds.  Hypokalemia - K 3.2 ABL anemia- hgb 9.2, stable Paroxysmal atrial fibrillation - On amiodarone gtt and heparin. Cards following.  HLD CKD, stage III  Gout - allopurinol  FEN- IVF, soft diet ID- perioperative cefotetan 7/2 VTE- SCD's, heparin    LOS: 15 days    Jerrye Beavers , [facility] 09/05/2016, 8:10 AM Pager: [phone] Consults: [phone] Mon-Fri 7:00  am-4:30 pm Sat-Sun 7:00 am-11:30 am

## 2016-09-05 NOTE — Progress Notes (Addendum)
[facility] NOTE   Pharmacy Consult for TPN Indication: prolonged ileus  Patient Measurements: Height: 5\' 11"  (180.3 cm) Weight: 214 lb 1.1 oz (97.1 kg) IBW/kg (Calculated) : 75.3 TPN AdjBW (KG): 92.6 Body mass index is 29.86 kg/m.  Assessment: 84 yoM admitted on 6/25 with rectal bleeding, s/p colonoscopy on 6/28 and colectomy on 7/2.  He now has prolonged ileus and pharmacy is consulted to dose TPN.  Central access:  PICC on 7/7 TPN start date: 7/7  Insulin Requirements: 5 units from SSI over past 24 hours  Current Nutrition: FLD  IVF: D5-1/2NS at 30 ml/hr  Today, 09/05/2016:   Glucose (goal <150): no hx of DM; only 1 CBG slightly above goal at 151  Electrolytes - K+ decressed to 3.2 today despite replacement yesterday. Others WNL including CorrCa.  Renal - SCr labile, decreased today. BUN WNL.  LFTs - AST/ALT increased yesterday  TGs - 139 (7/7), 104 (7/9)  Prealbumin - 10.3 (7/7), 11.1 (7/9)  Per surgery note, patient continues to have bowel function, no n/v, tolerating soft diet. Concerned that TPN causing rash on back of legs. Discussed discontinuing TPN today.  NUTRITIONAL GOALS                                                                                             RD recs (7/5):  Kcal:  2100-2300, Protein:  110-120g  Clinimix 5/15 at 100 ml/hr over 24 hours + 20% fat emulsion at 20 ml/hr over 12 hours will provide: 120g protein and 2184 kcal daily  PLAN                                                                                                                        KCl 40 mEq PO solution x 2. Continue current bag of Clinimix E 5/20 at 83 ml/hr until 1600, then reduce to 40 ml/hr for 2 hours then discontinue TPN. Discontinue TPN labs and orders, including SSI and CBG checks since patient does not have h/o DM and only required minimal SSI coverage while on TPN.  Hershal Coria, PharmD, BCPS Pager:  [phone] 09/05/2016 7:22 AM

## 2016-09-05 NOTE — Assessment & Plan Note (Signed)
Bleeding SSA polyp

## 2016-09-05 NOTE — Care Management Note (Signed)
Case Management Note  Patient Details  Name: Bradley Short MRN: [id number] Date of Birth: [birth year]  Subjective/Objective: Pt admitted with acute GI bleeding. Pt underwent colonoscopy which showed diverticular disease, neoplasm in the cecum at the appendiceal orifice that was suspicious for malignancy. Pt underwent lap assisted R colectomy. Now WBC are increasing 17.5 today. ID consulted.            Action/Plan: Plan to discharge home with wife.   Expected Discharge Date:   09/08/2016               Expected Discharge Plan:  [place]  In-House Referral:     Discharge planning Services  CM Consult  Post Acute Care Choice:    Choice offered to:  Spouse  DME Arranged:    DME Agency:     HH Arranged:    [place] Agency:     Status of Service:  In process, will continue to follow  If discussed at Long Length of Stay Meetings, dates discussed:    Additional CommentsPurcell Mouton, RN 09/05/2016, 3:20 PM

## 2016-09-06 LAB — URINALYSIS, ROUTINE W REFLEX MICROSCOPIC
Bilirubin Urine: NEGATIVE
Glucose, UA: NEGATIVE mg/dL
Hgb urine dipstick: NEGATIVE
Ketones, ur: NEGATIVE mg/dL
Leukocytes, UA: NEGATIVE
Nitrite: NEGATIVE
Protein, ur: NEGATIVE mg/dL
Specific Gravity, Urine: 1.012 (ref 1.005–1.030)
pH: 7 (ref 5.0–8.0)

## 2016-09-06 LAB — CBC
HCT: 27.9 % — ABNORMAL LOW (ref 39.0–52.0)
HEMOGLOBIN: 9.2 g/dL — AB (ref 13.0–17.0)
MCH: 29 pg (ref 26.0–34.0)
MCHC: 33 g/dL (ref 30.0–36.0)
MCV: 88 fL (ref 78.0–100.0)
PLATELETS: 356 10*3/uL (ref 150–400)
RBC: 3.17 MIL/uL — ABNORMAL LOW (ref 4.22–5.81)
RDW: 15.6 % — ABNORMAL HIGH (ref 11.5–15.5)
WBC: 21.8 10*3/uL — ABNORMAL HIGH (ref 4.0–10.5)

## 2016-09-06 MED ORDER — HYDROXYZINE HCL 10 MG PO TABS
10.0000 mg | ORAL_TABLET | Freq: Three times a day (TID) | ORAL | Status: DC | PRN
  Administered 2016-09-08: 10 mg via ORAL
  Filled 2016-09-06: qty 1

## 2016-09-06 MED ORDER — HYDROCORTISONE 1 % EX LOTN
TOPICAL_LOTION | Freq: Every day | CUTANEOUS | Status: DC
  Administered 2016-09-06: 23:00:00 via TOPICAL
  Administered 2016-09-07: 1 via TOPICAL
  Administered 2016-09-08 – 2016-09-11 (×4): via TOPICAL
  Filled 2016-09-06 (×3): qty 118

## 2016-09-06 MED ORDER — DIPHENHYDRAMINE HCL 25 MG PO CAPS
25.0000 mg | ORAL_CAPSULE | Freq: Three times a day (TID) | ORAL | Status: DC
  Administered 2016-09-06 – 2016-09-10 (×12): 25 mg via ORAL
  Filled 2016-09-06 (×13): qty 1

## 2016-09-06 MED ORDER — FAMOTIDINE 20 MG PO TABS
20.0000 mg | ORAL_TABLET | Freq: Two times a day (BID) | ORAL | Status: DC
  Administered 2016-09-06 – 2016-09-12 (×12): 20 mg via ORAL
  Filled 2016-09-06 (×12): qty 1

## 2016-09-06 MED ORDER — HYDROCORTISONE 0.5 % EX CREA
TOPICAL_CREAM | Freq: Three times a day (TID) | CUTANEOUS | Status: DC
  Administered 2016-09-06: 18:00:00 via TOPICAL
  Filled 2016-09-06 (×2): qty 28.35

## 2016-09-06 NOTE — Progress Notes (Signed)
    [facility] for Infectious Disease   Reason for visit: Follow up on leukocytosis and rash  Interval History: rash continues to improve, leukocytosis up; no diarrhea (4 stools documented), not loose or watery, some fullness with po intake  Physical Exam: Constitutional:  Vitals:   09/06/16 0648 09/06/16 1421  BP: (!) 102/46 (!) 116/44  Pulse: 84 88  Resp: 18 16  Temp: 98.1 F (36.7 C) 98.1 F (36.7 C)   patient appears in NAD Respiratory: Normal respiratory effort; CTA B Cardiovascular: RRR GI: soft, nt, no distension now  Review of Systems: Constitutional: negative for fevers and chills Respiratory: negative for cough or sputum Gastrointestinal: negative for nausea and diarrhea Genitourinary: negative for frequency and dysuria  Lab Results  Component Value Date   WBC 21.8 (H) 09/06/2016   HGB 9.2 (L) 09/06/2016   HCT 27.9 (L) 09/06/2016   MCV 88.0 09/06/2016   PLT 356 09/06/2016    Lab Results  Component Value Date   CREATININE 1.22 09/05/2016   BUN 9 09/05/2016   NA 141 09/05/2016   K 3.2 (L) 09/05/2016   CL 107 09/05/2016   CO2 24 09/05/2016    Lab Results  Component Value Date   ALT 94 (H) 09/04/2016   AST 72 (H) 09/04/2016   ALKPHOS 116 09/04/2016     Microbiology: Recent Results (from the past 240 hour(s))  Surgical pcr screen     Status: None   Collection Time: 08/28/16  6:46 AM  Result Value Ref Range Status   MRSA, PCR NEGATIVE NEGATIVE Final   Staphylococcus aureus NEGATIVE NEGATIVE Final    Comment:        The Xpert SA Assay (FDA approved for NASAL specimens in patients over 9 years of age), is one component of a comprehensive surveillance program.  Test performance has been validated by [facility] for patients greater than or equal to 46 year old. It is not intended to diagnose infection nor to guide or monitor treatment.   C difficile quick scan w PCR reflex     Status: None   Collection Time: 08/31/16  8:01 PM  Result  Value Ref Range Status   C Diff antigen NEGATIVE NEGATIVE Final   C Diff toxin NEGATIVE NEGATIVE Final   C Diff interpretation No C. difficile detected.  Final    Impression/Plan:  1. Leukocytosis - unclear etiology and no associated symptoms concerning for infectious source.  I am going to d/c the C diff stool test since there is no indication to test it, has not been collected since this am without stool output and would only represent colonization and not true infection  2. Rash - improving, no clear etiology.  May be cause of #1.  At this time, no current infection noted so will sign off but please call with any changes or concerns.

## 2016-09-06 NOTE — Progress Notes (Signed)
Progress Note  Patient Name: Bradley Short Date of Encounter: 09/06/2016  Primary Cardiologist: Allred / Hochrein   Subjective   83 yo with colon cancer, s/o hemicolectomy With PAF  Controlled with amio Back on xarelto   Inpatient Medications    Scheduled Meds: . amiodarone  200 mg Oral Daily  . calamine   Topical BID  . feeding supplement (ENSURE ENLIVE)  237 mL Oral BID BM  . rivaroxaban  20 mg Oral Q supper   Continuous Infusions: . dextrose 5 % and 0.45% NaCl 30 mL/hr at 09/06/16 1000   PRN Meds: hydrOXYzine, iopamidol, LORazepam, [DISCONTINUED] ondansetron **OR** ondansetron (ZOFRAN) IV, sodium chloride flush   Vital Signs    Vitals:   09/05/16 1300 09/05/16 2213 09/05/16 2231 09/06/16 0648  BP: (!) 116/45 (!) 122/54  (!) 102/46  Pulse: 78 81  84  Resp: 18 18  18   Temp: 97.6 F (36.4 C) 99.2 F (37.3 C)  98.1 F (36.7 C)  TempSrc: Oral Oral  Oral  SpO2: 99% 99%  100%  Weight:   210 lb 5.1 oz (95.4 kg)   Height:        Intake/Output Summary (Last 24 hours) at 09/06/16 1114 Last data filed at 09/06/16 1000  Gross per 24 hour  Intake              260 ml  Output             1400 ml  Net            -1140 ml   Filed Weights   08/28/16 1700 09/01/16 0423 09/05/16 2231  Weight: 204 lb 2.3 oz (92.6 kg) 214 lb 1.1 oz (97.1 kg) 210 lb 5.1 oz (95.4 kg)    Telemetry    NSR  - Personally Reviewed  ECG     NSR  - Personally Reviewed  Physical Exam   GEN: No acute distress.   Neck: No JVD Cardiac: RRR, no murmurs, rubs, or gallops.  Respiratory: Clear to auscultation bilaterally. GI: Soft, nontender, non-distended  MS: No edema; No deformity. Neuro:  Nonfocal  Psych: Normal affect   Labs    Chemistry Recent Labs Lab 09/01/16 0303 09/02/16 0452 09/04/16 0808 09/05/16 0448  NA 139 137 140 141  K 3.2* 3.4* 3.4* 3.2*  CL 110 107 108 107  CO2 22 25 25 24   GLUCOSE 151* 130* 134* 142*  BUN 12 13 9 9   CREATININE 1.28* 1.45* 1.24 1.22    CALCIUM 8.0* 8.0* 8.2* 8.3*  PROT 6.3* 5.9* 6.0*  --   ALBUMIN 2.7* 2.7* 2.5*  --   AST 21 34 72*  --   ALT 19 30 94*  --   ALKPHOS 58 69 116  --   BILITOT 0.6 0.7 0.6  --   GFRNONAA 50* 43* 52* 53*  GFRAA 58* 50* >60 >60  ANIONGAP 7 5 7 10      Hematology Recent Labs Lab 09/04/16 0808 09/05/16 0448 09/06/16 0403  WBC 14.9* 17.9* 21.8*  RBC 3.14* 3.13* 3.17*  HGB 9.2* 9.2* 9.2*  HCT 27.8* 27.7* 27.9*  MCV 88.5 88.5 88.0  MCH 29.3 29.4 29.0  MCHC 33.1 33.2 33.0  RDW 15.5 15.6* 15.6*  PLT 272 312 356    Cardiac EnzymesNo results for input(s): TROPONINI in the last 168 hours. No results for input(s): TROPIPOC in the last 168 hours.   BNPNo results for input(s): BNP, PROBNP in the last 168 hours.   DDimer  No results for input(s): DDIMER in the last 168 hours.   Radiology    Dg Chest 2 View  Result Date: 09/05/2016 CLINICAL DATA:  Shortness of Breath EXAM: CHEST  2 VIEW COMPARISON:  September 03, 2016 FINDINGS: Central catheter tip is in the superior vena cava. No pneumothorax. There is slight bibasilar atelectasis. No edema or consolidation. Heart size and pulmonary vascularity are normal. No adenopathy. There is aortic atherosclerosis. No evident bone lesions. There is degenerative change in the thoracic spine. IMPRESSION: Mild bibasilar atelectasis. No edema or consolidation. Stable cardiac silhouette. There is aortic atherosclerosis. Central catheter tip in superior vena cava. No pneumothorax. Aortic Atherosclerosis (ICD10-I70.0). Electronically Signed   By: Lowella Grip III M.D.   On: 09/05/2016 08:57   Ct Abdomen Pelvis W Contrast  Result Date: 09/04/2016 CLINICAL DATA:  83 y/o M; status post laparoscopic assisted right colectomy 08/28/2016. Leukocytosis. Evaluate for postoperative complications. EXAM: CT ABDOMEN AND PELVIS WITH CONTRAST TECHNIQUE: Multidetector CT imaging of the abdomen and pelvis was performed using the standard protocol following bolus administration of  intravenous contrast. CONTRAST:  33mL ISOVUE-300 IOPAMIDOL (ISOVUE-300) INJECTION 61%, 191mL ISOVUE-300 IOPAMIDOL (ISOVUE-300) INJECTION 61% COMPARISON:  08/27/2016 CT of the abdomen and pelvis. FINDINGS: Lower chest: Minor atelectasis in the lingula and bilateral lower lobes. Moderate to severe coronary artery calcification. Mild cardiomegaly. Hepatobiliary: No focal liver abnormality is seen. No gallstones, gallbladder wall thickening, or biliary dilatation. Pancreas: Unremarkable. No pancreatic ductal dilatation or surrounding inflammatory changes. Spleen: Normal in size without focal abnormality. Adrenals/Urinary Tract: Normal adrenal glands. Numerous parapelvic cysts of the kidneys bilaterally. No hydronephrosis. Normal bladder. Stomach/Bowel: Right partial colectomy. The sigmoid colon and rectum are diffusely featureless with mild wall thickening. Moderate proximal sigmoid diverticulosis. Patent enterocolic anastomosis without evidence for bowel obstruction. Oral contrast extends to the rectum. Peritoneal fluid collections: 1. Anterior and superior to the anastomosis measuring 44 x 21 x 38 mm (AP x ML x CC ), series 2, image 45. 2. Posterior to the anastomosis measuring 34 x 15 x 46 mm, series 2, image 49. 3. Pelvis posterior to the bladder measuring 38 x 27 x 41 mm, series 5, image 73. There is faint enhancement at the margin of the collection posterior to the anastomosis but no definite organization into anabscess. Vascular/Lymphatic: Aortic atherosclerosis. No enlarged abdominal or pelvic lymph nodes. Reproductive: Mild prostate enlargement. Other: Postsurgical changes in the midline anterior abdominal wall without fluid collection. Musculoskeletal: No fracture is seen. IMPRESSION: 1. Small fluid collections are present surrounding the right lower quadrant enterocolic anastomosis and within the pelvis. No discrete abscess. 2. Distal sigmoid colon and rectum are featureless with wall thickening compatible  with colitis. Electronically Signed   By: Kristine Garbe M.D.   On: 09/04/2016 20:43    Cardiac Studies     Patient Profile     83 y.o. male with bowel surgery . With atrial fib following surgery   Assessment & Plan    1. Atrial fib:   Has a hx of AF in the past.  Had PAF following colon surgery .   Well controlled with amio . amio dose has been redued to 200 mg a day  Back on home dose of xarelto Stable from a cardiac standpoint  Will sign off. Call for question   Signed, Mertie Moores, MD  09/06/2016, 11:14 AM

## 2016-09-06 NOTE — Progress Notes (Signed)
[facility] Progress Note  Patient: Bradley Short IWP:[id number]   PCP: Manon Hilding, MD DOB: Nov 26, 1932   DOA: 08/21/2016   DOS: 09/06/2016   Date of Service: the patient was seen and examined on 09/06/2016  Subjective: Patient's primary complaint is fatigue also has some pain in his left ankle 1 out of 10, bilateral swelling has been present for last few days. Denies having any complaints of dizziness, nausea, lightheadedness. No chest pain and abdominal pain. No cough or shortness of breath.  Brief hospital course: Pt. with PMH of A. 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 Underwent right colectomy on 08/28/2016. A. fib with RVR. Procedure started on amiodarone and Cardizem, cardiology was consulted. Noticed a rash on 09/03/2016 after starting TPN and developed leukocytosis as well. Currently further plan is to monitor for improvement in rash and leukocytosis.  Assessment and Plan: 1. Acute blood was anemia due to GI bleeding. Cecal adenocarcinoma S/P right colectomy on 08/28/2016. Postop ileus. Presented with fatigue as well as BRBPR. Underwent colonoscopy which was showing diverticulosis as well as a cecal lesion suspicious for cancer. Biopsies were performed which was positive for high-grade dysplasia and suspicious for adenocarcinoma. General surgery was consulted and performed right colectomy on 08/28/2016. Developed post op ileus. CT on 09/04/2016 shows no definite abscess or leak. Surgery continues to follow the patient. Diet has been advance diet present. Ambulate as tolerated.  2. Leukocytosis, extensive cutaneous rash. Family after receiving PICC line, patient got some TPN. After that the patient started developing rash involving his stomach and it spread to his thigh and his back. Today she has also spread to her upper back as well as lower legs. The  rashes maculopapular, nonblanching. Causing itching. 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 We will treat with Benadryl, Pepcid, topical hydrocortisone cream. Monitor for next 24-48 hours for improvement with the treatment. Using systemic steroids with recent surgery is not recommended. Most likely patient's leukocytosis is actually coming from this extensive rash(skin is the largest organ) Do not suspect that the patient has active cellulitis right now but this rash can always gets secondarily infected. Infectious disease on board. Recommend to continue to monitor off of the antibiotics which I agree. Would prefer skin punch biopsy for further diagnosis.  3. History of bipolar disorder. Anxiety. Patient started on BuSpar as well as activity 1.   We'll monitor.  4. Lower leg edema. Likely third spacing. Continue compression stocking. Ambulate with assistance. Patient is still 7 L positive. If does not improve, may require Lasix.  5. Hypokalemia. Replaced  6. A. fib with RVR. Started on amiodarone. Will continue. Xarelto resumed per home dose. Cardiology was consulted currently signed off.  Diet: soft diet DVT Prophylaxis: subcutaneous Heparin  Advance goals of care discussion: full code  Family Communication: family was present at bedside, at the time of interview. The pt provided permission to discuss medical plan with the family. Opportunity was given to ask question and all questions were answered satisfactorily.   Disposition:  Discharge to home with home health in 2-3 days.  Consultants: Gen. surgery, GI, ID Procedures: Right colectomy 08/28/2016  Antibiotics: Anti-infectives    Start     Dose/Rate Route Frequency Ordered Stop   09/05/16 1400  cephALEXin (KEFLEX) capsule 500 mg  Status:  Discontinued     500 mg Oral  Every 6 hours 09/05/16 1259 09/06/16 0902   09/05/16 1300  cephALEXin  (KEFLEX) capsule 500 mg  Status:  Discontinued     500 mg Oral Every 12 hours 09/05/16 1259 09/05/16 1259   08/28/16 1133  cefoTEtan in Dextrose 5% (CEFOTAN) 2-2.08 GM-% IVPB    Comments:  [name], Lacey   : cabinet override      08/28/16 1133 08/28/16 1145   08/28/16 0945  cefoTEtan (CEFOTAN) 2 g in dextrose 5 % 50 mL IVPB     2 g 100 mL/hr over 30 Minutes Intravenous On call to O.R. 08/28/16 0941 08/28/16 1710       Objective: Physical Exam: Vitals:   09/05/16 2213 09/05/16 2231 09/06/16 0648 09/06/16 1421  BP: (!) 122/54  (!) 102/46 (!) 116/44  Pulse: 81  84 88  Resp: 18  18 16   Temp: 99.2 F (37.3 C)  98.1 F (36.7 C) 98.1 F (36.7 C)  TempSrc: Oral  Oral Oral  SpO2: 99%  100% 99%  Weight:  95.4 kg (210 lb 5.1 oz)    Height:        Intake/Output Summary (Last 24 hours) at 09/06/16 1622 Last data filed at 09/06/16 1000  Gross per 24 hour  Intake              210 ml  Output              200 ml  Net               10 ml   Filed Weights   08/28/16 1700 09/01/16 0423 09/05/16 2231  Weight: 92.6 kg (204 lb 2.3 oz) 97.1 kg (214 lb 1.1 oz) 95.4 kg (210 lb 5.1 oz)   General: Alert, Awake and Oriented to Time, Place and Person. Appear in mild distress, affect appropriate Eyes: PERRL, Conjunctiva normal ENT: Oral Mucosa clear dry. Neck: no JVD, no Abnormal Mass Or lumps Cardiovascular: S1 and S2 Present, aortic systolic Murmur, Peripheral Pulses Present Respiratory: normal respiratory effort, Bilateral Air entry equal and Decreased, no use of accessory muscle, Clear to Auscultation, no Crackles, no wheezes Abdomen: Bowel Sound present, Soft and no tenderness, no hernia Skin: Diffuse maculopapular rash involving torso both front and back as well as thigh, now appearing confluent, warm, tender to touch and causing itching. Extremities: bilateral Pedal edema, no calf tenderness Neurologic: Grossly no focal neuro deficit. Bilaterally Equal motor strength  Data  Reviewed: CBC:  Recent Labs Lab 09/01/16 0303 09/02/16 0452 09/03/16 0205 09/04/16 0808 09/05/16 0448 09/06/16 0403  WBC 8.3 9.1 11.5* 14.9* 17.9* 21.8*  NEUTROABS 6.0 6.5  --  12.1* 14.5*  --   HGB 10.2* 9.4* 9.2* 9.2* 9.2* 9.2*  HCT 30.2* 27.7* 27.5* 27.8* 27.7* 27.9*  MCV 88.8 88.5 88.4 88.5 88.5 88.0  PLT 240 234 243 272 312 665   Basic Metabolic Panel:  Recent Labs Lab 08/31/16 0853 09/01/16 0303 09/02/16 0452 09/04/16 0808 09/05/16 0448  NA 138 139 137 140 141  K 3.6 3.2* 3.4* 3.4* 3.2*  CL 107 110 107 108 107  CO2 24 22 25 25 24   GLUCOSE 141* 151* 130* 134* 142*  BUN 14 12 13 9 9   CREATININE 1.41* 1.28* 1.45* 1.24 1.22  CALCIUM 8.3* 8.0* 8.0* 8.2* 8.3*  MG  --  2.2 2.3 2.3  --   PHOS  --   --  2.8 2.8  --     Liver Function Tests:  Recent Labs Lab 09/01/16 0303  09/02/16 0452 09/04/16 0808  AST 21 34 72*  ALT 19 30 94*  ALKPHOS 58 69 116  BILITOT 0.6 0.7 0.6  PROT 6.3* 5.9* 6.0*  ALBUMIN 2.7* 2.7* 2.5*   No results for input(s): LIPASE, AMYLASE in the last 168 hours. No results for input(s): AMMONIA in the last 168 hours. Coagulation Profile: No results for input(s): INR, PROTIME in the last 168 hours. Cardiac Enzymes: No results for input(s): CKTOTAL, CKMB, CKMBINDEX, TROPONINI in the last 168 hours. BNP (last 3 results) No results for input(s): PROBNP in the last 8760 hours. CBG:  Recent Labs Lab 09/04/16 1148 09/04/16 1829 09/05/16 0011 09/05/16 0618 09/05/16 1237  GLUCAP 119* 131* 138* 161* 106*   Studies: No results found.  Scheduled Meds: . amiodarone  200 mg Oral Daily  . calamine   Topical BID  . diphenhydrAMINE  25 mg Oral TID  . famotidine  20 mg Oral BID  . feeding supplement (ENSURE ENLIVE)  237 mL Oral BID BM  . hydrocortisone cream   Topical TID  . rivaroxaban  20 mg Oral Q supper   Continuous Infusions: . dextrose 5 % and 0.45% NaCl 30 mL/hr at 09/06/16 1427   PRN Meds: hydrOXYzine, iopamidol, LORazepam,  [DISCONTINUED] ondansetron **OR** ondansetron (ZOFRAN) IV, sodium chloride flush  Time spent: 35 minutes  Author: Berle Mull, MD [facility] Pager: [phone] 09/06/2016 4:22 PM  If 7PM-7AM, please contact night-coverage at [email or web address], password [facility]

## 2016-09-06 NOTE — Progress Notes (Signed)
Physical Therapy Treatment Patient Details Name: Bradley Short MRN: [id number] DOB: [birth year] Today's Date: 09/06/2016    History of Present Illness 83 year old male who was admitted to the hospital with acute lower GI bleeding.  Pt with hx of chronic atrial fibrillation and is on chronic anticoagulation. Pt underwent a colonoscopy which demonstrated diverticular disease (this is felt to be the source of his bleeding) (and a incidentally found neoplasm in the cecum at the appendiceal orifice that was suspicious for malignancy and pt with plans for lap assisted R colectomy Monday     PT Comments    Pt progressing well with mobility and with marked improvement in activity tolerance.   Follow Up Recommendations  Home health PT     Equipment Recommendations  None recommended by PT    Recommendations for Other Services OT consult     Precautions / Restrictions Precautions Precautions: Fall Restrictions Weight Bearing Restrictions: No    Mobility  Bed Mobility Overal bed mobility: Needs Assistance Bed Mobility: Supine to Sit     Supine to sit: Min guard;HOB elevated     General bed mobility comments: Increased time with HOB to 45   Transfers Overall transfer level: Needs assistance Equipment used: Rolling walker (2 wheeled) Transfers: Sit to/from Stand Sit to Stand: Min guard         General transfer comment: verbal cues for hand placement,   Ambulation/Gait Ambulation/Gait assistance: Min guard;Supervision Ambulation Distance (Feet): 1500 Feet Assistive device: Rolling walker (2 wheeled) Gait Pattern/deviations: Step-through pattern;Decreased step length - right;Decreased step length - left;Shuffle;Trunk flexed Gait velocity: decr   General Gait Details: cues for posture and position from RW.  Attempted ambulation sans AD but increased in instability   Stairs            Wheelchair Mobility    Modified Rankin (Stroke Patients Only)       Balance  Overall balance assessment: Needs assistance Sitting-balance support: No upper extremity supported;Feet supported Sitting balance-Leahy Scale: Good     Standing balance support: No upper extremity supported Standing balance-Leahy Scale: Fair                              Cognition Arousal/Alertness: Awake/alert Behavior During Therapy: WFL for tasks assessed/performed;Anxious Overall Cognitive Status: Within Functional Limits for tasks assessed                                 General Comments: requires frequent cues, is very methodical and verbalizes each step of mobility- "I am turning, I am backing up, etc.      Exercises      General Comments        Pertinent Vitals/Pain Pain Assessment: No/denies pain    Home Living                      Prior Function            PT Goals (current goals can now be found in the care plan section) Acute Rehab PT Goals Patient Stated Goal: Walk PT Goal Formulation: With patient Time For Goal Achievement: 09/15/16 Potential to Achieve Goals: Good Progress towards PT goals: Progressing toward goals    Frequency    Min 3X/week      PT Plan Discharge plan needs to be updated    Co-evaluation  AM-PAC PT "6 Clicks" Daily Activity  Outcome Measure  Difficulty turning over in bed (including adjusting bedclothes, sheets and blankets)?: A Little Difficulty moving from lying on back to sitting on the side of the bed? : A Lot Difficulty sitting down on and standing up from a chair with arms (e.g., wheelchair, bedside commode, etc,.)?: A Little Help needed moving to and from a bed to chair (including a wheelchair)?: A Little Help needed walking in hospital room?: A Little Help needed climbing 3-5 steps with a railing? : A Lot 6 Click Score: 16    End of Session Equipment Utilized During Treatment: Gait belt Activity Tolerance: Patient tolerated treatment well Patient left: in  chair;with call bell/phone within reach;with family/visitor present Nurse Communication: Mobility status PT Visit Diagnosis: Difficulty in walking, not elsewhere classified (R26.2)     Time: 6431-4276 PT Time Calculation (min) (ACUTE ONLY): 24 min  Charges:  $Gait Training: 23-37 mins                    G Codes:       Pg [phone]    Jesus Nevills 09/06/2016, 1:15 PM

## 2016-09-06 NOTE — Progress Notes (Addendum)
Spoke with pt and wife at bedside concerning Walker needs. Pt's wife request HHRN,PT,NA and DME RW. [facility] was selected.  MD will needs orders and face to face please.

## 2016-09-06 NOTE — Progress Notes (Signed)
Patient ID: [id number] Bradley Short, male   DOB: 13-Oct-1932, 83 y.o.   MRN: [id number]  [facility] Surgery Progress Note  9 Days Post-Op  Subjective: CC- rash States that rash is still present, unchanged from yesterday. Tolerating soft diet. Had a loose BM this morning. Denies n/v. Denies cough, CP, SOB, dysuria, or any new symptoms. WBC up to 21.8 today, afebrile.  Objective: Vital signs in last 24 hours: Temp:  [97.6 F (36.4 C)-99.2 F (37.3 C)] 98.1 F (36.7 C) (07/11 0648) Pulse Rate:  [78-84] 84 (07/11 0648) Resp:  [18] 18 (07/11 0648) BP: (102-122)/(45-54) 102/46 (07/11 0648) SpO2:  [99 %-100 %] 100 % (07/11 0648) Weight:  [210 lb 5.1 oz (95.4 kg)] 210 lb 5.1 oz (95.4 kg) (07/10 2231) Last BM Date: 09/06/16  Intake/Output from previous day: 07/10 0701 - 07/11 0700 In: 50 [I.V.:50] Out: 1750 [Urine:1750] Intake/Output this shift: Total I/O In: 120 [P.O.:120] Out: -   PE: Gen: Alert, NAD, pleasant HEENT: EOM's intact, pupils equal  Card: RRR Pulm: CTAB, no W/R/R, effort normal Abd: Soft, nondistended, +BS, midlineincision C/D/I with staples intact, diffuse erythematous rash on lower abdomen/upper thighs and back Psych: A&Ox3  Ext: 2+ pitting edema BLE L>R, no calf tenderness  Lab Results:   Recent Labs  09/05/16 0448 09/06/16 0403  WBC 17.9* 21.8*  HGB 9.2* 9.2*  HCT 27.7* 27.9*  PLT 312 356   BMET  Recent Labs  09/04/16 0808 09/05/16 0448  NA 140 141  K 3.4* 3.2*  CL 108 107  CO2 25 24  GLUCOSE 134* 142*  BUN 9 9  CREATININE 1.24 1.22  CALCIUM 8.2* 8.3*   PT/INR No results for input(s): LABPROT, INR in the last 72 hours. CMP     Component Value Date/Time   NA 141 09/05/2016 0448   K 3.2 (L) 09/05/2016 0448   CL 107 09/05/2016 0448   CO2 24 09/05/2016 0448   GLUCOSE 142 (H) 09/05/2016 0448   BUN 9 09/05/2016 0448   CREATININE 1.22 09/05/2016 0448   CALCIUM 8.3 (L) 09/05/2016 0448   PROT 6.0 (L) 09/04/2016 0808   ALBUMIN 2.5 (L)  09/04/2016 0808   AST 72 (H) 09/04/2016 0808   ALT 94 (H) 09/04/2016 0808   ALKPHOS 116 09/04/2016 0808   BILITOT 0.6 09/04/2016 0808   GFRNONAA 53 (L) 09/05/2016 0448   GFRAA >60 09/05/2016 0448   Lipase  No results found for: LIPASE     Studies/Results: Dg Chest 2 View  Result Date: 09/05/2016 CLINICAL DATA:  Shortness of Breath EXAM: CHEST  2 VIEW COMPARISON:  September 03, 2016 FINDINGS: Central catheter tip is in the superior vena cava. No pneumothorax. There is slight bibasilar atelectasis. No edema or consolidation. Heart size and pulmonary vascularity are normal. No adenopathy. There is aortic atherosclerosis. No evident bone lesions. There is degenerative change in the thoracic spine. IMPRESSION: Mild bibasilar atelectasis. No edema or consolidation. Stable cardiac silhouette. There is aortic atherosclerosis. Central catheter tip in superior vena cava. No pneumothorax. Aortic Atherosclerosis (ICD10-I70.0). Electronically Signed   By: Lowella Grip III M.D.   On: 09/05/2016 08:57   Ct Abdomen Pelvis W Contrast  Result Date: 09/04/2016 CLINICAL DATA:  83 y/o M; status post laparoscopic assisted right colectomy 08/28/2016. Leukocytosis. Evaluate for postoperative complications. EXAM: CT ABDOMEN AND PELVIS WITH CONTRAST TECHNIQUE: Multidetector CT imaging of the abdomen and pelvis was performed using the standard protocol following bolus administration of intravenous contrast. CONTRAST:  39mL ISOVUE-300 IOPAMIDOL (ISOVUE-300) INJECTION 61%,  171mL ISOVUE-300 IOPAMIDOL (ISOVUE-300) INJECTION 61% COMPARISON:  08/27/2016 CT of the abdomen and pelvis. FINDINGS: Lower chest: Minor atelectasis in the lingula and bilateral lower lobes. Moderate to severe coronary artery calcification. Mild cardiomegaly. Hepatobiliary: No focal liver abnormality is seen. No gallstones, gallbladder wall thickening, or biliary dilatation. Pancreas: Unremarkable. No pancreatic ductal dilatation or surrounding inflammatory  changes. Spleen: Normal in size without focal abnormality. Adrenals/Urinary Tract: Normal adrenal glands. Numerous parapelvic cysts of the kidneys bilaterally. No hydronephrosis. Normal bladder. Stomach/Bowel: Right partial colectomy. The sigmoid colon and rectum are diffusely featureless with mild wall thickening. Moderate proximal sigmoid diverticulosis. Patent enterocolic anastomosis without evidence for bowel obstruction. Oral contrast extends to the rectum. Peritoneal fluid collections: 1. Anterior and superior to the anastomosis measuring 44 x 21 x 38 mm (AP x ML x CC ), series 2, image 45. 2. Posterior to the anastomosis measuring 34 x 15 x 46 mm, series 2, image 49. 3. Pelvis posterior to the bladder measuring 38 x 27 x 41 mm, series 5, image 73. There is faint enhancement at the margin of the collection posterior to the anastomosis but no definite organization into anabscess. Vascular/Lymphatic: Aortic atherosclerosis. No enlarged abdominal or pelvic lymph nodes. Reproductive: Mild prostate enlargement. Other: Postsurgical changes in the midline anterior abdominal wall without fluid collection. Musculoskeletal: No fracture is seen. IMPRESSION: 1. Small fluid collections are present surrounding the right lower quadrant enterocolic anastomosis and within the pelvis. No discrete abscess. 2. Distal sigmoid colon and rectum are featureless with wall thickening compatible with colitis. Electronically Signed   By: Kristine Garbe M.D.   On: 09/04/2016 20:43    Anti-infectives: Anti-infectives    Start     Dose/Rate Route Frequency Ordered Stop   09/05/16 1400  cephALEXin (KEFLEX) capsule 500 mg  Status:  Discontinued     500 mg Oral Every 6 hours 09/05/16 1259 09/06/16 0902   09/05/16 1300  cephALEXin (KEFLEX) capsule 500 mg  Status:  Discontinued     500 mg Oral Every 12 hours 09/05/16 1259 09/05/16 1259   08/28/16 1133  cefoTEtan in Dextrose 5% (CEFOTAN) 2-2.08 GM-% IVPB    Comments:   [name], Bella Kennedy   : cabinet override      08/28/16 1133 08/28/16 1145   08/28/16 0945  cefoTEtan (CEFOTAN) 2 g in dextrose 5 % 50 mL IVPB     2 g 100 mL/hr over 30 Minutes Intravenous On call to O.R. 08/28/16 0941 08/28/16 1710       Assessment/Plan Acute lower GIB - etiology diverticular disease vs malignancy  S/P COLONOSCOPY 6/28: Cecal/appendiceal orifice mass - BX shows high grade dysplasia with small focus conerning for adenocarcinoma  S/P LAPAROSCOPIC ASSISTED RIGHT COLECTOMY 08/28/16 Dr. Autumn Messing III - POD 9 - surgical pathology: SESSILE SERRATED ADENOMA WITH HIGH GRADE DYSPLASIA, 21 BENIGN LYMPH NODES, MARGINS UNINVOLVED BY NEOPLASIA, DIVERTICULAR DISEASE, APPENDIX WITH NO DIAGNOSTIC ABNORMALITY, SMALL BOWEL WITH NO DIAGNOSTIC ABNORMALITY - CT 7/9 showed no definite abscess or leak, possibly some colitis  Rash Hypokalemia - K 3.2 (7/10) ABL anemia- hgb 9.2, stable Paroxysmal atrial fibrillation - On oral amiodarone and xarelto Cards following.  HLD CKD, stage III  Gout - allopurinol  FEN- soft diet ID- perioperative cefotetan 7/2, keflex 7/10>7/11 VTE- SCD's, xarelto  Plan - WBC continues to rise, 21.8, afebrile. Will check for c diff, urinalysis, and BLE u/s for DVT. D/c PICC line. D/c keflex. Continue soft diet. Continue PT/ambulation. Check CBC in AM.   LOS: 16 days  Jerrye Beavers , [facility] Surgery 09/06/2016, 9:02 AM Pager: [phone] Consults: [phone] Mon-Fri 7:00 am-4:30 pm Sat-Sun 7:00 am-11:30 am

## 2016-09-07 ENCOUNTER — Encounter (HOSPITAL_COMMUNITY): Payer: Medicare Other

## 2016-09-07 DIAGNOSIS — C189 Malignant neoplasm of colon, unspecified: Secondary | ICD-10-CM

## 2016-09-07 LAB — COMPREHENSIVE METABOLIC PANEL
ALT: 66 U/L — ABNORMAL HIGH (ref 17–63)
AST: 41 U/L (ref 15–41)
Albumin: 2.7 g/dL — ABNORMAL LOW (ref 3.5–5.0)
Alkaline Phosphatase: 102 U/L (ref 38–126)
Anion gap: 10 (ref 5–15)
BUN: 17 mg/dL (ref 6–20)
CHLORIDE: 100 mmol/L — AB (ref 101–111)
CO2: 22 mmol/L (ref 22–32)
Calcium: 8.3 mg/dL — ABNORMAL LOW (ref 8.9–10.3)
Creatinine, Ser: 1.49 mg/dL — ABNORMAL HIGH (ref 0.61–1.24)
GFR, EST AFRICAN AMERICAN: 49 mL/min — AB (ref >60)
GFR, EST NON AFRICAN AMERICAN: 42 mL/min — AB (ref >60)
Glucose, Bld: 149 mg/dL — ABNORMAL HIGH (ref 65–99)
POTASSIUM: 4 mmol/L (ref 3.5–5.1)
SODIUM: 132 mmol/L — AB (ref 135–145)
Total Bilirubin: 0.8 mg/dL (ref 0.3–1.2)
Total Protein: 6.3 g/dL — ABNORMAL LOW (ref 6.5–8.1)

## 2016-09-07 LAB — URINE CULTURE

## 2016-09-07 LAB — DIFFERENTIAL
BASOS ABS: 0 10*3/uL (ref 0.0–0.1)
Basophils Relative: 0 %
EOS ABS: 1.1 10*3/uL — AB (ref 0.0–0.7)
Eosinophils Relative: 3 %
LYMPHS PCT: 3 %
Lymphs Abs: 1.1 10*3/uL (ref 0.7–4.0)
MONOS PCT: 4 %
Monocytes Absolute: 1.5 10*3/uL — ABNORMAL HIGH (ref 0.1–1.0)
NEUTROS PCT: 90 %
Neutro Abs: 34.3 10*3/uL — ABNORMAL HIGH (ref 1.7–7.7)

## 2016-09-07 LAB — CBC
HCT: 31.8 % — ABNORMAL LOW (ref 39.0–52.0)
Hemoglobin: 10.6 g/dL — ABNORMAL LOW (ref 13.0–17.0)
MCH: 29.4 pg (ref 26.0–34.0)
MCHC: 33.3 g/dL (ref 30.0–36.0)
MCV: 88.1 fL (ref 78.0–100.0)
PLATELETS: 412 10*3/uL — AB (ref 150–400)
RBC: 3.61 MIL/uL — AB (ref 4.22–5.81)
RDW: 15.6 % — AB (ref 11.5–15.5)
WBC: 38 10*3/uL — AB (ref 4.0–10.5)

## 2016-09-07 MED ORDER — PREDNISONE 20 MG PO TABS
40.0000 mg | ORAL_TABLET | Freq: Every day | ORAL | Status: DC
  Administered 2016-09-07 – 2016-09-08 (×2): 40 mg via ORAL
  Filled 2016-09-07 (×2): qty 2

## 2016-09-07 MED ORDER — FLUCONAZOLE 100 MG PO TABS
200.0000 mg | ORAL_TABLET | Freq: Every day | ORAL | Status: DC
  Administered 2016-09-07 – 2016-09-12 (×6): 200 mg via ORAL
  Filled 2016-09-07 (×6): qty 2

## 2016-09-07 MED ORDER — SODIUM CHLORIDE 0.9 % IV SOLN
INTRAVENOUS | Status: DC
  Administered 2016-09-07 – 2016-09-09 (×2): via INTRAVENOUS

## 2016-09-07 NOTE — Progress Notes (Signed)
Patient ID: [id number] Needles, male   DOB: [birth year], 83 y.o.   MRN: [id number]  [facility] Progress Note  10 Days Post-Op  Subjective: CC- rash Sitting up in chair. Denies abdominal pain. Tolerating soft diet. Had normal BM this AM. Patient feels that rash on trunk/back/legs is improving, but this morning he woke up with erythema on BUE as well. This is new since yesterday. He is not receiving anything IV any more.  Objective: Vital signs in last 24 hours: Temp:  [98.1 F (36.7 C)-99.9 F (37.7 C)] 98.7 F (37.1 C) (07/12 0553) Pulse Rate:  [85-91] 91 (07/12 0553) Resp:  [16-18] 18 (07/12 0553) BP: (116-119)/(44-48) 117/45 (07/12 0553) SpO2:  [97 %-99 %] 98 % (07/12 0553) Weight:  [215 lb 13.3 oz (97.9 kg)] 215 lb 13.3 oz (97.9 kg) (07/12 0553) Last BM Date: 09/06/16  Intake/Output from previous day: 07/11 0701 - 07/12 0700 In: 610 [P.O.:240; I.V.:370] Out: -  Intake/Output this shift: No intake/output data recorded.  PE: Gen: Alert, NAD, pleasant HEENT: EOM's intact, pupils equal  Card: RRR Pulm: CTAB, no W/R/R, effort normal Abd: Soft, nondistended, +BS, midlineincision C/D/I with staples intact, diffuse erythematous rash on lower abdomen/upper thighs and back Psych: A&Ox3  BLE: 2+ pitting edema BLE L>R, no calf tenderness BUE: erythema noted to antecubital fossa on RUE, no edema. Edema and tenderness distal LUE on radial aspect, erythema tracks from this area to antecubital fossa  Lab Results:   Recent Labs  09/05/16 0448 09/06/16 0403  WBC 17.9* 21.8*  HGB 9.2* 9.2*  HCT 27.7* 27.9*  PLT 312 356   BMET  Recent Labs  09/05/16 0448  NA 141  K 3.2*  CL 107  CO2 24  GLUCOSE 142*  BUN 9  CREATININE 1.22  CALCIUM 8.3*   PT/INR No results for input(s): LABPROT, INR in the last 72 hours. CMP     Component Value Date/Time   NA 141 09/05/2016 0448   K 3.2 (L) 09/05/2016 0448   CL 107 09/05/2016 0448   CO2 24 09/05/2016 0448   GLUCOSE  142 (H) 09/05/2016 0448   BUN 9 09/05/2016 0448   CREATININE 1.22 09/05/2016 0448   CALCIUM 8.3 (L) 09/05/2016 0448   PROT 6.0 (L) 09/04/2016 0808   ALBUMIN 2.5 (L) 09/04/2016 0808   AST 72 (H) 09/04/2016 0808   ALT 94 (H) 09/04/2016 0808   ALKPHOS 116 09/04/2016 0808   BILITOT 0.6 09/04/2016 0808   GFRNONAA 53 (L) 09/05/2016 0448   GFRAA >60 09/05/2016 0448   Lipase  No results found for: LIPASE     Studies/Results: Dg Chest 2 View  Result Date: 09/05/2016 CLINICAL DATA:  Shortness of Breath EXAM: CHEST  2 VIEW COMPARISON:  September 03, 2016 FINDINGS: Central catheter tip is in the superior vena cava. No pneumothorax. There is slight bibasilar atelectasis. No edema or consolidation. Heart size and pulmonary vascularity are normal. No adenopathy. There is aortic atherosclerosis. No evident bone lesions. There is degenerative change in the thoracic spine. IMPRESSION: Mild bibasilar atelectasis. No edema or consolidation. Stable cardiac silhouette. There is aortic atherosclerosis. Central catheter tip in superior vena cava. No pneumothorax. Aortic Atherosclerosis (ICD10-I70.0). Electronically Signed   By: Lowella Grip III M.D.   On: 09/05/2016 08:57    Anti-infectives: Anti-infectives    Start     Dose/Rate Route Frequency Ordered Stop   09/05/16 1400  cephALEXin (KEFLEX) capsule 500 mg  Status:  Discontinued     500 mg Oral  Every 6 hours 09/05/16 1259 09/06/16 0902   09/05/16 1300  cephALEXin (KEFLEX) capsule 500 mg  Status:  Discontinued     500 mg Oral Every 12 hours 09/05/16 1259 09/05/16 1259   08/28/16 1133  cefoTEtan in Dextrose 5% (CEFOTAN) 2-2.08 GM-% IVPB    Comments:  [name], Bella Kennedy   : cabinet override      08/28/16 1133 08/28/16 1145   08/28/16 0945  cefoTEtan (CEFOTAN) 2 g in dextrose 5 % 50 mL IVPB     2 g 100 mL/hr over 30 Minutes Intravenous On call to O.R. 08/28/16 0941 08/28/16 1710       Assessment/Plan Acute lower GIB - etiology diverticular disease vs  malignancy  S/P COLONOSCOPY 6/28: Cecal/appendiceal orifice mass - BX shows high grade dysplasia with small focus conerning for adenocarcinoma  S/P LAPAROSCOPIC ASSISTED RIGHT COLECTOMY 08/28/16 Dr. Autumn Messing III - POD 10 - surgical pathology: SESSILE SERRATED ADENOMA WITH HIGH GRADE DYSPLASIA, 21 BENIGN LYMPH NODES, MARGINS UNINVOLVED BY NEOPLASIA, DIVERTICULAR DISEASE, APPENDIX WITH NO DIAGNOSTIC ABNORMALITY, SMALL BOWEL WITH NO DIAGNOSTIC ABNORMALITY - CT 7/9 showed no definite abscess or leak, possibly some colitis - c diff negative 7/5 - tolerating soft diet and having bowel function  Rash/leukocytosis - appreciate ID consult. Feels that this may be cause of leukocytosis.  Hypokalemia - K 4 ABL anemia- hgb 10.6, stable Paroxysmal atrial fibrillation - On oral amiodarone and xarelto.  HLD CKD, stage III  Gout - allopurinol  FEN- soft diet ID- perioperative cefotetan 7/2, keflex 7/10>7/11. CXR WNL, u/a WNL, Ucx pending, c diff negative, PICC removed, BLE u/s pending. VTE- SCD's, xarelto  Plan - WBC up to 38.  Rash on trunk may be improving, but now with new erythema BUE. Check u/s LUE phlebitis; apply heat and elevate. Will discuss with ID. Continue soft diet, PT/ambulation.    LOS: 17 days    Jerrye Beavers , [facility] 09/07/2016, 8:22 AM Pager: [phone] Consults: [phone] Mon-Fri 7:00 am-4:30 pm Sat-Sun 7:00 am-11:30 am

## 2016-09-07 NOTE — Progress Notes (Signed)
[place]  Telephone:([phone]) [place]  DOB: Sep 15, 1932  MR#: [id number]  CSN#: [id number]    Requesting Physician: [facility]  Patient Care Team: Manon Hilding, MD as PCP - General (Unknown Physician Specialty)  Reason for consult: Leukocytosis  History of present illness:   A 83-year-old Caucasian male, with past medical history of atrial fibrillation on 02, OSA, was admitted on 08/21/2016 for rectal bleeding. He subsequently underwent partial colectomy for a benign adenoma of right colon. He received TPN after surgery, and next day developed diffuse skin rash. Meantime, his white count has significantly increased, to 38k today, with predominant neutrophilia. His WBC was normal before he developed skin rashes.   Patient told me he had a similar skin rash a few years ago, secondary to cardiac medication.  MEDICAL HISTORY:  Past Medical History:  Diagnosis Date  . Atrial fibrillation (HCC)    paroxysmal, s/p PVI by Dr Rayann Heman 8/12  . Atrial flutter ([place])    s/p CTI ablation  . Hyperlipidemia   . Obstructive sleep apnea (adult) (pediatric)    on CPAP    SURGICAL HISTORY: Past Surgical History:  Procedure Laterality Date  . ATRIAL ABLATION SURGERY     s/p CTI and PVI ablations by Dr Rayann Heman 8/12  . CARDIAC CATHETERIZATION     nonobstructive CAD 2000  . COLONOSCOPY WITH PROPOFOL N/A 08/24/2016   Procedure: COLONOSCOPY WITH PROPOFOL;  Surgeon: Teena Irani, MD;  Location: [facility] ENDOSCOPY;  Service: Endoscopy;  Laterality: N/A;  . LAPAROSCOPIC RIGHT COLECTOMY Right 08/28/2016   Procedure: LAPAROSCOPIC ASSISTED RIGHT COLECTOMY;  Surgeon: Jovita Kussmaul, MD;  Location: [facility] ORS;  Service: General;  Laterality: Right;  . melanoma (other)     resected from his right shoulder    SOCIAL HISTORY: Social History   Social History  . Marital status: Married    Spouse name: N/A  . Number of children: N/A  .  Years of education: N/A   Occupational History  . Not on file.   Social History Main Topics  . Smoking status: Never Smoker  . Smokeless tobacco: Never Used  . Alcohol use No  . Drug use: No  . Sexual activity: Not on file   Other Topics Concern  . Not on file   Social History Narrative   Lives in [place].  Retired Warden/ranger    FAMILY HISTORY: Family History  Problem Relation Age of Onset  . Prostate cancer Father   . Seizures Other   . Cancer Other   . Diabetes Other   . Heart disease Other     ALLERGIES:  is allergic to diltiazem hcl; lisinopril; metoprolol succinate; and propafenone hcl.  MEDICATIONS:  Current Facility-Administered Medications  Medication Dose Route Frequency Provider Last Rate Last Dose  . 0.9 %  sodium chloride infusion   Intravenous Continuous Domenic Polite, MD 50 mL/hr at 09/07/16 1836    . calamine lotion   Topical BID Nita Sells, MD      . diphenhydrAMINE (BENADRYL) capsule 25 mg  25 mg Oral TID Lavina Hamman, MD   25 mg at 09/07/16 2100  . famotidine (PEPCID) tablet 20 mg  20 mg Oral BID Lavina Hamman, MD   20 mg at 09/07/16 2100  . feeding supplement (ENSURE ENLIVE) (ENSURE ENLIVE) liquid 237 mL  237 mL Oral BID BM Autumn Messing III, MD   237 mL at 09/06/16 1426  . fluconazole (  DIFLUCAN) tablet 200 mg  200 mg Oral Daily Izora Gala A, PA-C   200 mg at 09/07/16 1600  . hydrocortisone 1 % lotion   Topical Daily Lavina Hamman, MD   1 application at 38/93/73 4287  . hydrOXYzine (ATARAX/VISTARIL) tablet 10 mg  10 mg Oral TID PRN Lavina Hamman, MD      . iopamidol (ISOVUE-300) 61 % injection 15 mL  15 mL Oral BID PRN Autumn Messing III, MD   15 mL at 09/04/16 1130  . LORazepam (ATIVAN) 2 MG/ML concentrated solution 0.5-1 mg  0.5-1 mg Oral Q6H PRN Nita Sells, MD   1 mg at 09/07/16 2159  . ondansetron (ZOFRAN) injection 4 mg  4 mg Intravenous Q6H PRN Rise Patience, MD   4 mg at 08/31/16 2356  . predniSONE (DELTASONE)  tablet 40 mg  40 mg Oral Q breakfast Domenic Polite, MD   40 mg at 09/07/16 1100  . rivaroxaban (XARELTO) tablet 20 mg  20 mg Oral Q supper Nita Sells, MD   20 mg at 09/07/16 1600  . sodium chloride flush (NS) 0.9 % injection 10-40 mL  10-40 mL Intracatheter PRN Autumn Messing III, MD   10 mL at 09/05/16 0455    REVIEW OF SYSTEMS:   Constitutional: Denies fevers, chills or abnormal night sweats, (+) weight loss and fatigue  SKIN: diffuse rashes  Respiratory: Denies cough, dyspnea or wheezes Cardiovascular: Denies palpitation, chest discomfort or lower extremity swelling Gastrointestinal:  Denies nausea, heartburn or change in bowel habits Skin: Denies abnormal skin rashes Lymphatics: Denies new lymphadenopathy or easy bruising Neurological:Denies numbness, tingling or new weaknesses Behavioral/Psych: Mood is stable, no new changes  All other systems were reviewed with the patient and are negative.  PHYSICAL EXAMINATION  Vitals:   09/07/16 1401 09/07/16 2300  BP: (!) 119/50 (!) 117/49  Pulse: 87 82  Resp: 18 (!) 22  Temp: 99.3 F (37.4 C) 99.1 F (37.3 C)   Filed Weights   09/01/16 0423 09/05/16 2231 09/07/16 0553  Weight: 214 lb 1.1 oz (97.1 kg) 210 lb 5.1 oz (95.4 kg) 215 lb 13.3 oz (97.9 kg)    GENERAL:alert, no distress and comfortable SKIN: Diffuse maculopapular rash involving bilateral thigh, trunk, and upper extremities, some areas of confluent, warm.  EYES: normal, conjunctiva are pink and non-injected, sclera clear OROPHARYNX:no exudate, no erythema and lips, buccal mucosa, and tongue normal  NECK: supple, thyroid normal size, non-tender, without nodularity LYMPH:  no palpable lymphadenopathy in the cervical, axillary or inguinal LUNGS: clear to auscultation and percussion with normal breathing effort HEART: regular rate & rhythm and no murmurs and no lower extremity edema ABDOMEN:abdomen soft, non-tender and normal bowel sounds Musculoskeletal:no cyanosis of  digits and no clubbing  PSYCH: alert & oriented x 3 with fluent speech NEURO: no focal motor/sensory deficits  LABORATORY DATA:  I have reviewed the data as listed Lab Results  Component Value Date   WBC 38.0 (H) 09/07/2016   HGB 10.6 (L) 09/07/2016   HCT 31.8 (L) 09/07/2016   MCV 88.1 09/07/2016   PLT 412 (H) 09/07/2016    Recent Labs  09/02/16 0452 09/04/16 0808 09/05/16 0448 09/07/16 0833  NA 137 140 141 132*  K 3.4* 3.4* 3.2* 4.0  CL 107 108 107 100*  CO2 25 25 24 22   GLUCOSE 130* 134* 142* 149*  BUN 13 9 9 17   CREATININE 1.45* 1.24 1.22 1.49*  CALCIUM 8.0* 8.2* 8.3* 8.3*  GFRNONAA 43* 52* 53* 42*  GFRAA 50* >60 >60 49*  PROT 5.9* 6.0*  --  6.3*  ALBUMIN 2.7* 2.5*  --  2.7*  AST 34 72*  --  41  ALT 30 94*  --  66*  ALKPHOS 69 116  --  102  BILITOT 0.7 0.6  --  0.8    RADIOGRAPHIC STUDIES: I have personally reviewed the radiological images as listed and agreed with the findings in the report. Ct Abdomen Pelvis Wo Contrast  Result Date: 08/27/2016 CLINICAL DATA:  Preop right colectomy.  GI bleed. EXAM: CT ABDOMEN AND PELVIS WITHOUT CONTRAST TECHNIQUE: Multidetector CT imaging of the abdomen and pelvis was performed following the standard protocol without IV contrast. COMPARISON:  08/21/2016 FINDINGS: Lower chest: No acute findings. Hepatobiliary: No focal hepatic abnormality. Gallbladder unremarkable. Pancreas: No focal abnormality or ductal dilatation. Spleen: No focal abnormality.  Normal size. Adrenals/Urinary Tract: Adrenal glands unremarkable. Bilateral renal parapelvic cysts and cortical thinning. No hydronephrosis. Urinary bladder unremarkable. Stomach/Bowel: Appendix is normal. Scattered sigmoid diverticula. No active diverticulitis. Stomach and small bowel decompressed, unremarkable. Vascular/Lymphatic: Aortic and iliac calcifications. No aneurysm or adenopathy. Reproductive: Prostate enlargement. Other: No free fluid or free air. Musculoskeletal: No acute bony  abnormality. IMPRESSION: Sigmoid diverticulosis.  No active diverticulitis. Aortoiliac atherosclerosis. Bilateral renal parapelvic cysts. No acute findings. Electronically Signed   By: Rolm Baptise M.D.   On: 08/27/2016 10:30   Dg Chest 2 View  Result Date: 09/05/2016 CLINICAL DATA:  Shortness of Breath EXAM: CHEST  2 VIEW COMPARISON:  September 03, 2016 FINDINGS: Central catheter tip is in the superior vena cava. No pneumothorax. There is slight bibasilar atelectasis. No edema or consolidation. Heart size and pulmonary vascularity are normal. No adenopathy. There is aortic atherosclerosis. No evident bone lesions. There is degenerative change in the thoracic spine. IMPRESSION: Mild bibasilar atelectasis. No edema or consolidation. Stable cardiac silhouette. There is aortic atherosclerosis. Central catheter tip in superior vena cava. No pneumothorax. Aortic Atherosclerosis (ICD10-I70.0). Electronically Signed   By: Lowella Grip III M.D.   On: 09/05/2016 08:57   Dg Chest 2 View  Result Date: 09/01/2016 CLINICAL DATA:  Images obtained for PNA  pneumonia EXAM: CHEST - 2 VIEW COMPARISON:  08/27/2016 FINDINGS: Relatively low lung volumes. Some patchy subsegmental atelectasis versus early interstitial infiltrate at the left lung base and in the right infrahilar region. No confluent airspace consolidation. Heart size and mediastinal contours are within normal limits. Atheromatous aorta. No effusion.  No pneumothorax. Anterior vertebral endplate spurring at multiple levels in the mid and lower thoracic spine. IMPRESSION: 1. Low volumes with some patchy subsegmental atelectasis versus early interstitial infiltrate at the left base and right infrahilar region. Electronically Signed   By: Lucrezia Europe M.D.   On: 09/01/2016 08:41   Dg Chest 2 View  Result Date: 08/27/2016 CLINICAL DATA:  Preop obstructive sleep apnea EXAM: CHEST  2 VIEW COMPARISON:  10/11/2010 FINDINGS: Heart and mediastinal contours are within normal  limits. No focal opacities or effusions. No acute bony abnormality. IMPRESSION: No active cardiopulmonary disease. Electronically Signed   By: Rolm Baptise M.D.   On: 08/27/2016 11:01   Ct Abdomen Pelvis W Contrast  Result Date: 09/04/2016 CLINICAL DATA:  83 y/o M; status post laparoscopic assisted right colectomy 08/28/2016. Leukocytosis. Evaluate for postoperative complications. EXAM: CT ABDOMEN AND PELVIS WITH CONTRAST TECHNIQUE: Multidetector CT imaging of the abdomen and pelvis was performed using the standard protocol following bolus administration of intravenous contrast. CONTRAST:  50mL ISOVUE-300 IOPAMIDOL (ISOVUE-300) INJECTION 61%, 14mL ISOVUE-300  IOPAMIDOL (ISOVUE-300) INJECTION 61% COMPARISON:  08/27/2016 CT of the abdomen and pelvis. FINDINGS: Lower chest: Minor atelectasis in the lingula and bilateral lower lobes. Moderate to severe coronary artery calcification. Mild cardiomegaly. Hepatobiliary: No focal liver abnormality is seen. No gallstones, gallbladder wall thickening, or biliary dilatation. Pancreas: Unremarkable. No pancreatic ductal dilatation or surrounding inflammatory changes. Spleen: Normal in size without focal abnormality. Adrenals/Urinary Tract: Normal adrenal glands. Numerous parapelvic cysts of the kidneys bilaterally. No hydronephrosis. Normal bladder. Stomach/Bowel: Right partial colectomy. The sigmoid colon and rectum are diffusely featureless with mild wall thickening. Moderate proximal sigmoid diverticulosis. Patent enterocolic anastomosis without evidence for bowel obstruction. Oral contrast extends to the rectum. Peritoneal fluid collections: 1. Anterior and superior to the anastomosis measuring 44 x 21 x 38 mm (AP x ML x CC ), series 2, image 45. 2. Posterior to the anastomosis measuring 34 x 15 x 46 mm, series 2, image 49. 3. Pelvis posterior to the bladder measuring 38 x 27 x 41 mm, series 5, image 73. There is faint enhancement at the margin of the collection posterior  to the anastomosis but no definite organization into anabscess. Vascular/Lymphatic: Aortic atherosclerosis. No enlarged abdominal or pelvic lymph nodes. Reproductive: Mild prostate enlargement. Other: Postsurgical changes in the midline anterior abdominal wall without fluid collection. Musculoskeletal: No fracture is seen. IMPRESSION: 1. Small fluid collections are present surrounding the right lower quadrant enterocolic anastomosis and within the pelvis. No discrete abscess. 2. Distal sigmoid colon and rectum are featureless with wall thickening compatible with colitis. Electronically Signed   By: Kristine Garbe M.D.   On: 09/04/2016 20:43   Dg Chest Port 1 View  Result Date: 09/02/2016 CLINICAL DATA:  PICC line placement EXAM: PORTABLE CHEST 1 VIEW COMPARISON:  09/01/2016 FINDINGS: 1643 hours. Low volume film with basilar atelectasis. No overt edema. The cardio pericardial silhouette is enlarged. Right PICC line is new in the interval with the tip positioned over the mid SVC level. Telemetry leads overlie the chest. IMPRESSION: Right PICC line tip overlies the mid SVC level. Electronically Signed   By: Misty Stanley M.D.   On: 09/02/2016 17:20   Dg Abd 2 Views  Result Date: 09/01/2016 CLINICAL DATA:  Ileus following gastrointestinal surgery EXAM: ABDOMEN - 2 VIEW COMPARISON:  CT abdomen and pelvis 08/27/2016 FINDINGS: Gas within stomach. Numerous air-filled loops of small bowel throughout abdomen. Scattered colonic gas. Findings are most consistent with a postoperative ileus. No definite bowel wall thickening or free air. Degenerative changes lumbar spine. Diffuse osseous demineralization. Skin clips at midline in mid abdomen. IMPRESSION: Postoperative ileus. Electronically Signed   By: Lavonia Dana M.D.   On: 09/01/2016 15:47   Dg Abd Acute W/chest  Result Date: 09/03/2016 CLINICAL DATA:  Increased white blood cell count. EXAM: DG ABDOMEN ACUTE W/ 1V CHEST COMPARISON:  09/02/2016 chest x-ray.   Abdomen study from 09/01/2016. FINDINGS: Low lung volumes without edema or focal airspace consolidation. Atelectasis noted at the lung bases. Cardiopericardial silhouette is at upper limits of normal for size. Right PICC line tip overlies the mid to distal SVC level. The visualized bony structures of the thorax are intact. Large gastric bubble noted. Diffuse gaseous small bowel distension persists and is similar to prior. Small bowel loops in the central abdomen measure up to 5 cm diameter which is not substantially changed. Stool and gas are noted in the right colon. IMPRESSION: 1. Low lung volumes with basilar atelectasis. 2. Prominent gastric bubble with diffuse gaseous small bowel dilatation, similar to  the study from 09/01/2016. Postop day 6 for resection of colon mass suggests findings are related to postoperative adynamic ileus. Electronically Signed   By: Misty Stanley M.D.   On: 09/03/2016 09:00    ASSESSMENT & PLAN:  A 70-year-old gentleman with past medical history of atrial fibrillation on Xarelto, OSA, presented with rectal bleeding. He underwent right hemicolectomy for a benign adenoma, developed diffuse skin rash and leukocytosis after surgery.  1. Neutrophilia, likely reactive to his skin rashes and allergy  2. Diffuse skin rash, likely secondary to TPN or medications 3. Atrial fibrillation 4. OSA 5. Colon adenoma, s/p right hemicolectomy.  Recommendations: -I think his diffuse skin rash on likely drug rash (secondary to TPN or medication) -I have reviewed his peripheral blood smear, which showed significant neutrophilia, no atypical cells -His leukocytosis is almost certain reactive, secondary to his rash and allergy reaction. No concerns for chronic leukemia or bone marrow disease. -I do not think he needs further workup, I suggest clinical monitoring. -I explained to patient that his white count may slightly increase since he is taking steroids. -Please call me if you have  questions.    I spent 40 minutes for the consultation, was in 50% of face-to-face counseling. All questions were answered. The patient knows to call the clinic with any problems, questions or concerns.      Truitt Merle, MD 09/07/2016 11:33 PM

## 2016-09-07 NOTE — Progress Notes (Signed)
    [facility]   Reason for visit: Follow up on rash, leukocytosis  Interval History: WBC up to 38 and mainly neutrophils, no diarrhea, rash now on arms. No fever, no cough, no sob.  Creat increased to 1.49. Passing gas/stool.    Physical Exam: Constitutional:  Vitals:   09/07/16 0553 09/07/16 1401  BP: (!) 117/45 (!) 119/50  Pulse: 91 87  Resp: 18 18  Temp: 98.7 F (37.1 C) 99.3 F (37.4 C)   patient appears in NAD, on commode Respiratory: Normal respiratory effort; CTA B Cardiovascular: RRR GI: soft, nt, nd Skin: rash now on arms  Review of Systems: Constitutional: negative for fevers and chills Genitourinary: negative for dysuria  Lab Results  Component Value Date   WBC 38.0 (H) 09/07/2016   HGB 10.6 (L) 09/07/2016   HCT 31.8 (L) 09/07/2016   MCV 88.1 09/07/2016   PLT 412 (H) 09/07/2016    Lab Results  Component Value Date   CREATININE 1.49 (H) 09/07/2016   BUN 17 09/07/2016   NA 132 (L) 09/07/2016   K 4.0 09/07/2016   CL 100 (L) 09/07/2016   CO2 22 09/07/2016    Lab Results  Component Value Date   ALT 66 (H) 09/07/2016   AST 41 09/07/2016   ALKPHOS 102 09/07/2016     Microbiology: Recent Results (from the past 240 hour(s))  C difficile quick scan w PCR reflex     Status: None   Collection Time: 08/31/16  8:01 PM  Result Value Ref Range Status   C Diff antigen NEGATIVE NEGATIVE Final   C Diff toxin NEGATIVE NEGATIVE Final   C Diff interpretation No C. difficile detected.  Final  Culture, Urine     Status: Abnormal   Collection Time: 09/06/16 11:16 AM  Result Value Ref Range Status   Specimen Description URINE, CLEAN CATCH  Final   Special Requests NONE  Final   Culture MULTIPLE ORGANISMS PRESENT, NONE PREDOMINANT (A)  Final   Report Status 09/07/2016 FINAL  Final    Impression/Plan:  1. Rash - worsening but no etiology identified.  Since it is worsening, a biopsy may help. Will see how steroids affect it.  Trying  fluconazole with some Candidal-appearing areas  2.  luekocytosis - no etiology known.  With such significant elevation, C diff still a consideration but I would not recommmend testing without multiple loose stools and was C diff negative 7/5.   No indication for antibiotics.

## 2016-09-07 NOTE — Progress Notes (Signed)
Talked to vascular and she said it may be tomorrow am before she could come see him; pt notified

## 2016-09-07 NOTE — Progress Notes (Addendum)
[facility] Progress Note  Patient: Bradley Short BZJ:[id number]   PCP: Manon Hilding, MD DOB: June 04, 1932   DOA: 08/21/2016   DOS: 09/07/2016   Date of Service: the patient was seen and examined on 09/07/2016  Brief hospital course: Pt. with PMH of A. 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 Underwent right colectomy on 08/28/2016. A. fib with RVR. Procedure started on amiodarone and Cardizem, cardiology was consulted. Noticed a rash on 09/03/2016 after starting TPN and developed leukocytosis as well. Currently further plan is to monitor for improvement in rash and leukocytosis.  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 -underwent colonoscopy which was showing diverticulosis as well as a cecal lesion suspicious for cancer. Biopsies were performed which was positive for high-grade dysplasia and suspicious for adenocarcinoma. General surgery was consulted and performed right colectomy on 08/28/2016. Developed post op ileus. CT on 09/04/2016 shows no definite abscess or leak. Surgery continues to follow the patient. Diet has been advance diet present. Ambulate as tolerated. -improved  2. Leukocytosis, extensive cutaneous rash. -after receiving PICC line, patient started TPN. After that the patient started developing rash involving his abd and it spread to his thigh and his back, upper thigh. The rashes maculopapular, nonblanching. Causing itching. Suspect this is likely a drug rash from TPN/amiodarone/Reglan/Cardizem. -continue Rx with Benadryl, Pepcid, topical hydrocortisone cream, itching improving, trial of prednisone-will give a dose now -suspect causing leukamoid reaction -amio rash less likely but definitley a possibility, called and d/w cards, hold Amio    -given possible leukamoid reaction, will ask Heme for input -trial of steroids -appreciate ID input, no clear infection -if no change tomorrow will request punch biopsy per Surgery  3. History of bipolar disorder/anxiety -stable  4. Lower leg edema. Likely third spacing, continue compression stocking. -monitor, may need lasix in next few days  5. Hypokalemia. Replaced  6. A. fib with RVR. Started on amiodarone. Xarelto resumed per home dose. Cardiology was consulted currently signed off., called and d/w Dr.Nahser re rash, will hold amio and monitor  Diet: soft diet DVT Prophylaxis: subcutaneous Heparin  Advance goals of care discussion: full code  Family Communication:wife at bedside   Disposition: home when improved  Consultants: Gen. surgery, GI, ID Procedures: Right colectomy 08/28/2016  Antibiotics: Anti-infectives    Start     Dose/Rate Route Frequency Ordered Stop   09/07/16 1300  fluconazole (DIFLUCAN) tablet 200 mg     200 mg Oral Daily 09/07/16 1126     09/05/16 1400  cephALEXin (KEFLEX) capsule 500 mg  Status:  Discontinued     500 mg Oral Every 6 hours 09/05/16 1259 09/06/16 0902   09/05/16 1300  cephALEXin (KEFLEX) capsule 500 mg  Status:  Discontinued     500 mg Oral Every 12 hours 09/05/16 1259 09/05/16 1259   08/28/16 1133  cefoTEtan in Dextrose 5% (CEFOTAN) 2-2.08 GM-% IVPB    Comments:  [name], Lacey   : cabinet override      08/28/16 1133 08/28/16 1145   08/28/16 0945  cefoTEtan (CEFOTAN) 2 g in dextrose 5 % 50 mL IVPB     2 g 100 mL/hr over 30 Minutes Intravenous On call to O.R. 08/28/16 0941 08/28/16 1710       Objective: Physical Exam: Vitals:   09/06/16 1421 09/06/16 2125  09/07/16 0553 09/07/16 1401  BP: (!) 116/44 (!) 119/48 (!) 117/45 (!) 119/50  Pulse: 88 85 91 87  Resp: 16 16 18 18   Temp: 98.1 F (36.7 C) 99.9 F (37.7 C) 98.7 F (37.1 C) 99.3 F (37.4 C)  TempSrc: Oral Oral Oral Oral  SpO2: 99% 97% 98% 99%  Weight:   97.9  kg (215 lb 13.3 oz)   Height:        Intake/Output Summary (Last 24 hours) at 09/07/16 1508 Last data filed at 09/07/16 1300  Gross per 24 hour  Intake              610 ml  Output              400 ml  Net              210 ml   Filed Weights   09/01/16 0423 09/05/16 2231 09/07/16 0553  Weight: 97.1 kg (214 lb 1.1 oz) 95.4 kg (210 lb 5.1 oz) 97.9 kg (215 lb 13.3 oz)   General: AAOx3, no distress Eyes: PERRL, Conjunctiva normal ENT: Oral Mucosa clear dry. Neck: no JVD, no Abnormal Mass Or lumps Cardiovascular: S1 and S2 Present, aortic systolic Murmur, Peripheral Pulses Present Respiratory: normal respiratory effort, Bilateral Air entry equal and Decreased, no use of accessory muscle, Clear to Auscultation, no Crackles, no wheezes Abdomen: Bowel Sound present, Soft and no tenderness, no hernia, incisions well healed Skin: Diffuse maculopapular rash involving torso both front and back as well as thigh, now appearing confluent, warm, tender to touch and causing itching. Extremities: bilateral Pedal edema, no calf tenderness Neurologic: Grossly no focal neuro deficit. Bilaterally Equal motor strength  Data Reviewed: CBC:  Recent Labs Lab 09/01/16 0303 09/02/16 6606 09/03/16 0205 09/04/16 3016 09/05/16 0448 09/06/16 0403 09/07/16 0833  WBC 8.3 9.1 11.5* 14.9* 17.9* 21.8* 38.0*  NEUTROABS 6.0 6.5  --  12.1* 14.5*  --  34.3*  HGB 10.2* 9.4* 9.2* 9.2* 9.2* 9.2* 10.6*  HCT 30.2* 27.7* 27.5* 27.8* 27.7* 27.9* 31.8*  MCV 88.8 88.5 88.4 88.5 88.5 88.0 88.1  PLT 240 234 243 272 312 356 010*   Basic Metabolic Panel:  Recent Labs Lab 09/01/16 0303 09/02/16 0452 09/04/16 0808 09/05/16 0448 09/07/16 0833  NA 139 137 140 141 132*  K 3.2* 3.4* 3.4* 3.2* 4.0  CL 110 107 108 107 100*  CO2 22 25 25 24 22   GLUCOSE 151* 130* 134* 142* 149*  BUN 12 13 9 9 17   CREATININE 1.28* 1.45* 1.24 1.22 1.49*  CALCIUM 8.0* 8.0* 8.2* 8.3* 8.3*  MG 2.2 2.3 2.3  --   --   PHOS  --  2.8 2.8  --    --     Liver Function Tests:  Recent Labs Lab 09/01/16 0303 09/02/16 0452 09/04/16 0808 09/07/16 0833  AST 21 34 72* 41  ALT 19 30 94* 66*  ALKPHOS 58 69 116 102  BILITOT 0.6 0.7 0.6 0.8  PROT 6.3* 5.9* 6.0* 6.3*  ALBUMIN 2.7* 2.7* 2.5* 2.7*   No results for input(s): LIPASE, AMYLASE in the last 168 hours. No results for input(s): AMMONIA in the last 168 hours. Coagulation Profile: No results for input(s): INR, PROTIME in the last 168 hours. Cardiac Enzymes: No results for input(s): CKTOTAL, CKMB, CKMBINDEX, TROPONINI in the last 168 hours. BNP (last 3 results) No results for input(s): PROBNP in the last 8760 hours. CBG:  Recent Labs Lab 09/04/16 1148 09/04/16 1829 09/05/16 0011 09/05/16  9794 09/05/16 1237  GLUCAP 119* 131* 138* 161* 106*   Studies: No results found.  Scheduled Meds: . calamine   Topical BID  . diphenhydrAMINE  25 mg Oral TID  . famotidine  20 mg Oral BID  . feeding supplement (ENSURE ENLIVE)  237 mL Oral BID BM  . fluconazole  200 mg Oral Daily  . hydrocortisone   Topical Daily  . predniSONE  40 mg Oral Q breakfast  . rivaroxaban  20 mg Oral Q supper   Continuous Infusions:  PRN Meds: hydrOXYzine, iopamidol, LORazepam, [DISCONTINUED] ondansetron **OR** ondansetron (ZOFRAN) IV, sodium chloride flush  Time spent: 35 minutes  Author: Domenic Polite, MD [facility] Pager: [phone] 09/07/2016 3:08 PM  If 7PM-7AM, please contact night-coverage at [email or web address], password [facility]

## 2016-09-08 ENCOUNTER — Inpatient Hospital Stay (HOSPITAL_COMMUNITY): Payer: Medicare Other

## 2016-09-08 DIAGNOSIS — M7989 Other specified soft tissue disorders: Secondary | ICD-10-CM

## 2016-09-08 DIAGNOSIS — M79609 Pain in unspecified limb: Secondary | ICD-10-CM

## 2016-09-08 DIAGNOSIS — R609 Edema, unspecified: Secondary | ICD-10-CM

## 2016-09-08 LAB — COMPREHENSIVE METABOLIC PANEL
ALBUMIN: 2.4 g/dL — AB (ref 3.5–5.0)
ALK PHOS: 92 U/L (ref 38–126)
ALT: 61 U/L (ref 17–63)
AST: 31 U/L (ref 15–41)
Anion gap: 7 (ref 5–15)
BILIRUBIN TOTAL: 0.7 mg/dL (ref 0.3–1.2)
BUN: 20 mg/dL (ref 6–20)
CALCIUM: 8.3 mg/dL — AB (ref 8.9–10.3)
CO2: 24 mmol/L (ref 22–32)
CREATININE: 1.44 mg/dL — AB (ref 0.61–1.24)
Chloride: 104 mmol/L (ref 101–111)
GFR calc Af Amer: 51 mL/min — ABNORMAL LOW (ref >60)
GFR, EST NON AFRICAN AMERICAN: 44 mL/min — AB (ref >60)
GLUCOSE: 143 mg/dL — AB (ref 65–99)
Potassium: 3.9 mmol/L (ref 3.5–5.1)
Sodium: 135 mmol/L (ref 135–145)
TOTAL PROTEIN: 5.6 g/dL — AB (ref 6.5–8.1)

## 2016-09-08 LAB — CBC WITH DIFFERENTIAL/PLATELET
BASOS ABS: 0 10*3/uL (ref 0.0–0.1)
Basophils Relative: 0 %
EOS PCT: 2 %
Eosinophils Absolute: 0.9 10*3/uL — ABNORMAL HIGH (ref 0.0–0.7)
HEMATOCRIT: 28.6 % — AB (ref 39.0–52.0)
HEMOGLOBIN: 9.5 g/dL — AB (ref 13.0–17.0)
LYMPHS ABS: 1.3 10*3/uL (ref 0.7–4.0)
LYMPHS PCT: 3 %
MCH: 28.6 pg (ref 26.0–34.0)
MCHC: 33.2 g/dL (ref 30.0–36.0)
MCV: 86.1 fL (ref 78.0–100.0)
MONOS PCT: 3 %
Monocytes Absolute: 1.3 10*3/uL — ABNORMAL HIGH (ref 0.1–1.0)
NEUTROS ABS: 39.5 10*3/uL — AB (ref 1.7–7.7)
Neutrophils Relative %: 92 %
Platelets: 378 10*3/uL (ref 150–400)
RBC: 3.32 MIL/uL — AB (ref 4.22–5.81)
RDW: 15.4 % (ref 11.5–15.5)
WBC: 43 10*3/uL — AB (ref 4.0–10.5)

## 2016-09-08 NOTE — Care Management Important Message (Signed)
Important Message  Patient Details  Name: Bradley Short MRN: [id number] Date of Birth: [birth year]   Medicare Important Message Given:  Yes    Kerin Salen 09/08/2016, 11:49 AMImportant Message  Patient Details  Name: Bradley Short MRN: [id number] Date of Birth: 1932/11/20   Medicare Important Message Given:  Yes    Kerin Salen 09/08/2016, 11:49 AM

## 2016-09-08 NOTE — Progress Notes (Signed)
[facility] Progress Note  Patient: Bradley Short [id number]:[id number]   PCP: Manon Hilding, MD DOB: 01-28-1933   DOA: 08/21/2016   DOS: 09/08/2016   Date of Service: the patient was seen and examined on 09/08/2016  Brief hospital course: Pt. with PMH of A. 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 Underwent right colectomy on 08/28/2016. A. fib with RVR. Procedure started on amiodarone and Cardizem, cardiology was consulted. Noticed a rash on 09/03/2016 after starting TPN and developed leukocytosis as well. Currently further plan is to monitor for improvement in rash and leukocytosis.  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 -underwent colonoscopy which was showing diverticulosis as well as a cecal lesion suspicious for cancer. Biopsies were performed which was positive for high-grade dysplasia and suspicious for adenocarcinoma. General surgery was consulted and performed right colectomy on 08/28/2016. Developed post op ileus. CT on 09/04/2016 shows no definite abscess or leak. Surgery continues to follow the patient. Diet has been advance diet present. Ambulate as tolerated. -improved -FU with CCS   2. Leukocytosis, Allergic Drug rash -after receiving PICC line, patient started TPN. After that the patient started developing rash involving his abd and it spread to his thigh and his back, upper thigh. The rashes maculopapular, nonblanching. Causing itching. Suspect this is likely a drug rash from TPN/amiodarone/Reglan/Cardizem. -continue Rx with Benadryl, Pepcid, topical hydrocortisone cream, itching improving, rash in torso improving and overall less inflammed, bump is WBC this am likely from yesterday's steroids -prednisone 40mg  given 7/12 and 7/13 -amio  rash less likely but definitley a possibility, called and d/w cards Dr.Naher, ok to hold Amio, stopped this 7/12 -Appreciate Heme and ID input, elevated WBC is neutrophilia from allergic rash, no atypical cells noted per Heme -appreciate ID input, no clear infection -monitor clinically, fever curve much improved  3. History of bipolar disorder/anxiety -stable  4. Lower leg edema. Likely third spacing, continue compression stocking. -monitor, may need lasix in next few days  5. Hypokalemia. Replaced  6. A. fib with RVR. Started on amiodarone this admission Xarelto resumed per home dose. Cardiology was consulted currently signed off., called and d/w Dr.Nahser re rash, will hold amio and monitor  Diet: soft diet DVT Prophylaxis: subcutaneous Heparin  Advance goals of care discussion: full code  Family Communication:wife at bedside   Disposition: home when improved  Consultants: Gen. surgery, GI, ID Procedures: Right colectomy 08/28/2016  Antibiotics: Anti-infectives    Start     Dose/Rate Route Frequency Ordered Stop   09/07/16 1300  fluconazole (DIFLUCAN) tablet 200 mg     200 mg Oral Daily 09/07/16 1126     09/05/16 1400  cephALEXin (KEFLEX) capsule 500 mg  Status:  Discontinued     500 mg Oral Every 6 hours 09/05/16 1259 09/06/16 0902   09/05/16 1300  cephALEXin (KEFLEX) capsule 500 mg  Status:  Discontinued     500 mg Oral Every 12 hours 09/05/16 1259 09/05/16 1259   08/28/16 1133  cefoTEtan in Dextrose 5% (CEFOTAN) 2-2.08 GM-% IVPB    Comments:  [name], Lacey   : cabinet override      08/28/16 1133 08/28/16 1145   08/28/16 0945  cefoTEtan (CEFOTAN) 2 g in dextrose 5 % 50 mL IVPB     2 g 100 mL/hr over 30 Minutes  Intravenous On call to O.R. 08/28/16 0941 08/28/16 1710       Objective: Physical Exam: Vitals:   09/07/16 0553 09/07/16 1401 09/07/16 2300 09/08/16 0631  BP: (!) 117/45 (!) 119/50 (!) 117/49 (!) 112/45  Pulse: 91 87 82 76  Resp: 18 18 (!) 22 20    Temp: 98.7 F (37.1 C) 99.3 F (37.4 C) 99.1 F (37.3 C) 98.2 F (36.8 C)  TempSrc: Oral Oral Axillary Oral  SpO2: 98% 99% 99% 99%  Weight: 97.9 kg (215 lb 13.3 oz)   95.7 kg (210 lb 15.7 oz)  Height:        Intake/Output Summary (Last 24 hours) at 09/08/16 1320 Last data filed at 09/08/16 1100  Gross per 24 hour  Intake              970 ml  Output             3050 ml  Net            -2080 ml   Filed Weights   09/05/16 2231 09/07/16 0553 09/08/16 0631  Weight: 95.4 kg (210 lb 5.1 oz) 97.9 kg (215 lb 13.3 oz) 95.7 kg (210 lb 15.7 oz)    General: AAOx3, no distress Eyes: PERRL, Conjunctiva normal ENT: Oral Mucosa clear dry. Neck: no JVD, no Abnormal Mass Or lumps Cardiovascular: S1 and S2 Present, aortic systolic Murmur, Peripheral Pulses Present Respiratory: normal respiratory effort, Bilateral Air entry equal and Decreased, no use of accessory muscle, Clear to Auscultation, no Crackles, no wheezes Abdomen: Bowel Sound present, Soft and no tenderness, no hernia, incisions well healed Skin: Diffuse maculopapular rash involving torso both front and back as well as thigh, now appearing confluent, less inflammed today, extending to face/scalp Extremities: bilateral Pedal edema, no calf tenderness Neurologic: Grossly no focal neuro deficit. Bilaterally Equal motor strength  Data Reviewed: CBC:  Recent Labs Lab 09/02/16 0452  09/04/16 0808 09/05/16 0448 09/06/16 0403 09/07/16 0833 09/08/16 0430  WBC 9.1  < > 14.9* 17.9* 21.8* 38.0* 43.0*  NEUTROABS 6.5  --  12.1* 14.5*  --  34.3* 39.5*  HGB 9.4*  < > 9.2* 9.2* 9.2* 10.6* 9.5*  HCT 27.7*  < > 27.8* 27.7* 27.9* 31.8* 28.6*  MCV 88.5  < > 88.5 88.5 88.0 88.1 86.1  PLT 234  < > 272 312 356 412* 378  < > = values in this interval not displayed. Basic Metabolic Panel:  Recent Labs Lab 09/02/16 0452 09/04/16 0808 09/05/16 0448 09/07/16 0833 09/08/16 0430  NA 137 140 141 132* 135  K 3.4* 3.4* 3.2* 4.0 3.9  CL 107 108  107 100* 104  CO2 25 25 24 22 24   GLUCOSE 130* 134* 142* 149* 143*  BUN 13 9 9 17 20   CREATININE 1.45* 1.24 1.22 1.49* 1.44*  CALCIUM 8.0* 8.2* 8.3* 8.3* 8.3*  MG 2.3 2.3  --   --   --   PHOS 2.8 2.8  --   --   --     Liver Function Tests:  Recent Labs Lab 09/02/16 0452 09/04/16 0808 09/07/16 0833 09/08/16 0430  AST 34 72* 41 31  ALT 30 94* 66* 61  ALKPHOS 69 116 102 92  BILITOT 0.7 0.6 0.8 0.7  PROT 5.9* 6.0* 6.3* 5.6*  ALBUMIN 2.7* 2.5* 2.7* 2.4*   No results for input(s): LIPASE, AMYLASE in the last 168 hours. No results for input(s): AMMONIA in the last 168 hours. Coagulation Profile: No results for input(s):  INR, PROTIME in the last 168 hours. Cardiac Enzymes: No results for input(s): CKTOTAL, CKMB, CKMBINDEX, TROPONINI in the last 168 hours. BNP (last 3 results) No results for input(s): PROBNP in the last 8760 hours. CBG:  Recent Labs Lab 09/04/16 1148 09/04/16 1829 09/05/16 0011 09/05/16 0618 09/05/16 1237  GLUCAP 119* 131* 138* 161* 106*   Studies: No results found.  Scheduled Meds: . calamine   Topical BID  . diphenhydrAMINE  25 mg Oral TID  . famotidine  20 mg Oral BID  . feeding supplement (ENSURE ENLIVE)  237 mL Oral BID BM  . fluconazole  200 mg Oral Daily  . hydrocortisone   Topical Daily  . rivaroxaban  20 mg Oral Q supper   Continuous Infusions: . sodium chloride 50 mL/hr at 09/07/16 1836   PRN Meds: hydrOXYzine, iopamidol, LORazepam, [DISCONTINUED] ondansetron **OR** ondansetron (ZOFRAN) IV, sodium chloride flush  Time spent: 35 minutes  Author: Domenic Polite, MD [facility] Pager: [phone] 09/08/2016 1:20 PM  If 7PM-7AM, please contact night-coverage at [email or web address], password [facility]

## 2016-09-08 NOTE — Progress Notes (Signed)
VASCULAR LAB PRELIMINARY  PRELIMINARY  PRELIMINARY  PRELIMINARY  Bilateral lower extremity venous duplex completed.    Preliminary report:  Bilateral:  No evidence of DVT, superficial thrombosis, or Baker's Cyst.   Jyair Kiraly, RVS 09/08/2016, 12:16 PM

## 2016-09-08 NOTE — Progress Notes (Signed)
VASCULAR LAB PRELIMINARY  PRELIMINARY  PRELIMINARY  PRELIMINARY  Left upper extremity venous duplex completed.    Preliminary report:  Left - No evidence of DVT. Positive for a superficial thrombus oc the cephalic vein wrist to just above the elbow.  Canesha Tesfaye, RVS 09/08/2016, 11:45 AM

## 2016-09-08 NOTE — Discharge Instructions (Signed)
[facility]      [facility] [phone]  OPEN ABDOMINAL SURGERY: POST OP INSTRUCTIONS  Always review your discharge instruction sheet given to you by the facility where your surgery was performed.  IF YOU HAVE DISABILITY OR FAMILY LEAVE FORMS, YOU MUST BRING THEM TO THE OFFICE FOR PROCESSING.  PLEASE DO NOT GIVE THEM TO YOUR DOCTOR.  1. A prescription for pain medication may be given to you upon discharge.  Take your pain medication as prescribed, if needed.  If narcotic pain medicine is not needed, then you may take acetaminophen (Tylenol) or ibuprofen (Advil) as needed. 2. Take your usually prescribed medications unless otherwise directed. 3. If you need a refill on your pain medication, please contact your pharmacy. They will contact our office to request authorization.  Prescriptions will not be filled after 5pm or on week-ends. 4. You should follow a light diet the first few days after arrival home, such as soup and crackers, pudding, etc.unless your doctor has advised otherwise. A high-fiber, low fat diet can be resumed as tolerated.   Be sure to include lots of fluids daily. Most patients will experience some swelling and bruising on the chest and neck area.  Ice packs will help.  Swelling and bruising can take several days to resolve 5. Most patients will experience some swelling and bruising in the area of the incision. Ice pack will help. Swelling and bruising can take several days to resolve..  6. It is common to experience some constipation if taking pain medication after surgery.  Increasing fluid intake and taking a stool softener will usually help or prevent this problem from occurring.  A mild laxative (Milk of Magnesia or Miralax) should be taken according to package directions if there are no bowel movements after 48 hours. 7.  You may have steri-strips (small skin tapes) in place directly over the incision.  These strips should be left on the skin for 7-10 days.  If your  surgeon used skin glue on the incision, you may shower in 24 hours.  The glue will flake off over the next 2-3 weeks.  Any sutures or staples will be removed at the office during your follow-up visit. You may find that a light gauze bandage over your incision may keep your staples from being rubbed or pulled. You may shower and replace the bandage daily. 8. ACTIVITIES:  You may resume regular (light) daily activities beginning the next day--such as daily self-care, walking, climbing stairs--gradually increasing activities as tolerated.  You may have sexual intercourse when it is comfortable.  Refrain from any heavy lifting or straining until approved by your doctor. a. You may drive when you no longer are taking prescription pain medication, you can comfortably wear a seatbelt, and you can safely maneuver your car and apply brakes b. Return to Work: ___________________________________ 9. You should see your doctor in the office for a follow-up appointment approximately two weeks after your surgery.  Make sure that you call for this appointment within a day or two after you arrive home to insure a convenient appointment time. OTHER INSTRUCTIONS:  _____________________________________________________________ _____________________________________________________________  WHEN TO CALL YOUR DOCTOR: 1. Fever over 101.0 2. Inability to urinate 3. Nausea and/or vomiting 4. Extreme swelling or bruising 5. Continued bleeding from incision. 6. Increased pain, redness, or drainage from the incision. 7. Difficulty swallowing or breathing 8. Muscle cramping or spasms. 9. Numbness or tingling in hands or feet or around lips.  The clinic staff is available to   answer your questions during regular business hours.  Please don't hesitate to call and ask to speak to one of the nurses if you have concerns.  For further questions, please visit [email or web address]   

## 2016-09-08 NOTE — Progress Notes (Signed)
Pt will discharge home with [place]. Referral was given to in house rep.

## 2016-09-08 NOTE — Progress Notes (Signed)
Patient ID: [name], male   DOB: 12-06-1932, 83 y.o.   MRN: [id number]  [facility] Progress Note  11 Days Post-Op  Subjective: CC- rash Up in chair. Doing well with soft diet. Denies n/v. Had a soft BM this morning.  Rash has spread to head, but erythema is decreasing on his trunk and back.   Objective: Vital signs in last 24 hours: Temp:  [98.2 F (36.8 C)-99.3 F (37.4 C)] 98.2 F (36.8 C) (07/13 0631) Pulse Rate:  [76-87] 76 (07/13 0631) Resp:  [18-22] 20 (07/13 0631) BP: (112-119)/(45-50) 112/45 (07/13 0631) SpO2:  [99 %] 99 % (07/13 0631) Weight:  [210 lb 15.7 oz (95.7 kg)] 210 lb 15.7 oz (95.7 kg) (07/13 0631) Last BM Date: 09/08/16  Intake/Output from previous day: 07/12 0701 - 07/13 0700 In: 970 [P.O.:600; I.V.:370] Out: 2800 [Urine:2800] Intake/Output this shift: No intake/output data recorded.  PE: Gen: Alert, NAD, pleasant HEENT: EOM's intact, pupils equal  Card: RRR Pulm: CTAB, no W/R/R, effort normal Abd: Soft, nondistended, midlineincision C/D/I with staples intact, diffuse erythematous rash on lower abdomen/upper thighs and back (seems less erythematous today) Psych: A&Ox3  BLE: 1+ pitting edema BLE L>R, no calf tenderness BUE: diffuse erythema noted to ventral aspects of BUE, less bright than yesterday, swelling on LUE improved  Lab Results:   Recent Labs  09/07/16 0833 09/08/16 0430  WBC 38.0* 43.0*  HGB 10.6* 9.5*  HCT 31.8* 28.6*  PLT 412* 378   BMET  Recent Labs  09/07/16 0833 09/08/16 0430  NA 132* 135  K 4.0 3.9  CL 100* 104  CO2 22 24  GLUCOSE 149* 143*  BUN 17 20  CREATININE 1.49* 1.44*  CALCIUM 8.3* 8.3*   PT/INR No results for input(s): LABPROT, INR in the last 72 hours. CMP     Component Value Date/Time   NA 135 09/08/2016 0430   K 3.9 09/08/2016 0430   CL 104 09/08/2016 0430   CO2 24 09/08/2016 0430   GLUCOSE 143 (H) 09/08/2016 0430   BUN 20 09/08/2016 0430   CREATININE 1.44 (H) 09/08/2016  0430   CALCIUM 8.3 (L) 09/08/2016 0430   PROT 5.6 (L) 09/08/2016 0430   ALBUMIN 2.4 (L) 09/08/2016 0430   AST 31 09/08/2016 0430   ALT 61 09/08/2016 0430   ALKPHOS 92 09/08/2016 0430   BILITOT 0.7 09/08/2016 0430   GFRNONAA 44 (L) 09/08/2016 0430   GFRAA 51 (L) 09/08/2016 0430   Lipase  No results found for: LIPASE     Studies/Results: No results found.  Anti-infectives: Anti-infectives    Start     Dose/Rate Route Frequency Ordered Stop   09/07/16 1300  fluconazole (DIFLUCAN) tablet 200 mg     200 mg Oral Daily 09/07/16 1126     09/05/16 1400  cephALEXin (KEFLEX) capsule 500 mg  Status:  Discontinued     500 mg Oral Every 6 hours 09/05/16 1259 09/06/16 0902   09/05/16 1300  cephALEXin (KEFLEX) capsule 500 mg  Status:  Discontinued     500 mg Oral Every 12 hours 09/05/16 1259 09/05/16 1259   08/28/16 1133  cefoTEtan in Dextrose 5% (CEFOTAN) 2-2.08 GM-% IVPB    Comments:  [name], Lacey   : cabinet override      08/28/16 1133 08/28/16 1145   08/28/16 0945  cefoTEtan (CEFOTAN) 2 g in dextrose 5 % 50 mL IVPB     2 g 100 mL/hr over 30 Minutes Intravenous On call to O.R. 08/28/16 2778  08/28/16 1710       Assessment/Plan Acute lower GIB - etiology diverticular disease vs malignancy  S/P COLONOSCOPY 6/28: Cecal/appendiceal orifice mass - BX shows high grade dysplasia with small focus conerning for adenocarcinoma  S/P LAPAROSCOPIC ASSISTED RIGHT COLECTOMY 08/28/16 Dr. 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 Likely drug rash, and is causing the leukocytosis   Hypokalemia - resolved ABL anemia- hgb 9.5, stable Paroxysmal atrial fibrillation - On oral amiodarone and xarelto.  HLD CKD, stage III  Gout - allopurinol  FEN- soft diet ID- perioperative cefotetan 7/2, keflex 7/10>7/11. CXR WNL, u/a WNL, Ucx pending, c diff negative, PICC removed, BLE u/s pending. VTE- SCD's, xarelto  Plan - WBC up to 43, likely due to rash. Rash seems to be improving, medicine plans to monitor over the weekend. Continue soft diet, PT/ambulation. Will recheck patient on Monday and plan to d/c staples.   LOS: 18 days    Bradley Short , [facility] 09/08/2016, 9:56 AM Pager: [phone] Consults: [phone] Mon-Fri 7:00 am-4:30 pm Sat-Sun 7:00 am-11:30 am

## 2016-09-08 NOTE — Progress Notes (Signed)
    [facility]   Reason for visit: Follow up on rash, leukocytosis  Interval History: WBC up to 43 today and mainly neutrophils now in the setting of steroids, no diarrhea, rash improved in some places, itching continues to be better. No fever, no cough, no sob.   Passing gas/stool.    Physical Exam: Constitutional:  Vitals:   09/07/16 2300 09/08/16 0631  BP: (!) 117/49 (!) 112/45  Pulse: 82 76  Resp: (!) 22 20  Temp: 99.1 F (37.3 C) 98.2 F (36.8 C)   patient appears in NAD, in chair Respiratory: Normal respiratory effort; CTA B Cardiovascular: RRR GI: soft, nt, nd Skin: rash now on arms  Review of Systems: Constitutional: negative for fevers and chills Genitourinary: negative for dysuria  Lab Results  Component Value Date   WBC 43.0 (H) 09/08/2016   HGB 9.5 (L) 09/08/2016   HCT 28.6 (L) 09/08/2016   MCV 86.1 09/08/2016   PLT 378 09/08/2016    Lab Results  Component Value Date   CREATININE 1.44 (H) 09/08/2016   BUN 20 09/08/2016   NA 135 09/08/2016   K 3.9 09/08/2016   CL 104 09/08/2016   CO2 24 09/08/2016    Lab Results  Component Value Date   ALT 61 09/08/2016   AST 31 09/08/2016   ALKPHOS 92 09/08/2016     Microbiology: Recent Results (from the past 240 hour(s))  C difficile quick scan w PCR reflex     Status: None   Collection Time: 08/31/16  8:01 PM  Result Value Ref Range Status   C Diff antigen NEGATIVE NEGATIVE Final   C Diff toxin NEGATIVE NEGATIVE Final   C Diff interpretation No C. difficile detected.  Final  Culture, Urine     Status: Abnormal   Collection Time: 09/06/16 11:16 AM  Result Value Ref Range Status   Specimen Description URINE, CLEAN CATCH  Final   Special Requests NONE  Final   Culture MULTIPLE ORGANISMS PRESENT, NONE PREDOMINANT (A)  Final   Report Status 09/07/2016 FINAL  Final    Impression/Plan:  1. Rash - stable and slow improvement in most places.   2.  luekocytosis - no etiology  known.  Hematology agrees that it is related to rash,reactive.  No indication for further work up from an ID standpoint at this time.    3.  Possible Candidal rash - would treat for 5 days total.  At this time, I will sign off, call with any new concerns. thanks

## 2016-09-08 NOTE — Progress Notes (Signed)
Physical Therapy Treatment Patient Details Name: Bradley Short MRN: [id number] DOB: 1932/09/22 Today's Date: 09/08/2016    History of Present Illness 83 year old male who was admitted to the hospital with acute lower GI bleeding.  Pt with hx of chronic atrial fibrillation and is on chronic anticoagulation. Pt underwent a colonoscopy which demonstrated diverticular disease (this is felt to be the source of his bleeding) (and a incidentally found neoplasm in the cecum at the appendiceal orifice that was suspicious for malignancy and pt with plans for lap assisted R colectomy Monday     PT Comments    Initiated gait training with cane today.  He needs continued education with it for safety and recommend to con't to use RW with nursing staff. Con't to recommend HHPT.   Follow Up Recommendations  Home health PT     Equipment Recommendations  None recommended by PT    Recommendations for Other Services OT consult     Precautions / Restrictions Restrictions Weight Bearing Restrictions: No    Mobility  Bed Mobility         Supine to sit: Supervision;HOB elevated     General bed mobility comments: Increased time with HOB to 45   Transfers Overall transfer level: Needs assistance Equipment used: Straight cane Transfers: Sit to/from Stand Sit to Stand: Min guard         General transfer comment: cues for technique  Ambulation/Gait Ambulation/Gait assistance: Min guard;Min assist Ambulation Distance (Feet): 180 Feet Assistive device: Straight cane Gait Pattern/deviations: Decreased step length - right Gait velocity: decr   General Gait Details: Educated on proper technique with cane and needed frequent cueing to shorten L step length.  Gait fluctuated, but when he got a rhythym he did well with it, but would need to re-adjust frequently.   Stairs            Wheelchair Mobility    Modified Rankin (Stroke Patients Only)       Balance   Sitting-balance  support: No upper extremity supported;Feet supported Sitting balance-Leahy Scale: Good       Standing balance-Leahy Scale: Fair                              Cognition Arousal/Alertness: Awake/alert Behavior During Therapy: WFL for tasks assessed/performed;Anxious Overall Cognitive Status: Within Functional Limits for tasks assessed                                        Exercises General Exercises - Lower Extremity Ankle Circles/Pumps: AROM;Both Long Arc Quad: Strengthening;Both;10 reps;Seated Hip ABduction/ADduction: AROM;Both;10 reps;Seated Hip Flexion/Marching: Strengthening;Both;10 reps;Seated    General Comments        Pertinent Vitals/Pain Pain Assessment: No/denies pain    Home Living                      Prior Function            PT Goals (current goals can now be found in the care plan section) Acute Rehab PT Goals Patient Stated Goal: Walk PT Goal Formulation: With patient Time For Goal Achievement: 09/15/16 Potential to Achieve Goals: Good Progress towards PT goals: Progressing toward goals    Frequency    Min 3X/week      PT Plan Current plan remains appropriate    Co-evaluation  AM-PAC PT "6 Clicks" Daily Activity  Outcome Measure  Difficulty turning over in bed (including adjusting bedclothes, sheets and blankets)?: A Little Difficulty moving from lying on back to sitting on the side of the bed? : A Little Difficulty sitting down on and standing up from a chair with arms (e.g., wheelchair, bedside commode, etc,.)?: A Little Help needed moving to and from a bed to chair (including a wheelchair)?: A Little Help needed walking in hospital room?: A Little Help needed climbing 3-5 steps with a railing? : A Lot 6 Click Score: 17    End of Session Equipment Utilized During Treatment: Gait belt Activity Tolerance: Patient tolerated treatment well Patient left: in chair;with call  bell/phone within reach;with family/visitor present Nurse Communication: Mobility status PT Visit Diagnosis: Difficulty in walking, not elsewhere classified (R26.2)     Time: 0092-3300 PT Time Calculation (min) (ACUTE ONLY): 20 min  Charges:  $Gait Training: 8-22 mins                    G Codes:       Bradley Short, Bradley Short Pager [phone] 09/08/2016    Bradley Short 09/08/2016, 2:21 PM

## 2016-09-09 DIAGNOSIS — D72825 Bandemia: Secondary | ICD-10-CM

## 2016-09-09 LAB — CBC
HCT: 27.9 % — ABNORMAL LOW (ref 39.0–52.0)
Hemoglobin: 9.1 g/dL — ABNORMAL LOW (ref 13.0–17.0)
MCH: 29.2 pg (ref 26.0–34.0)
MCHC: 32.6 g/dL (ref 30.0–36.0)
MCV: 89.4 fL (ref 78.0–100.0)
PLATELETS: 413 10*3/uL — AB (ref 150–400)
RBC: 3.12 MIL/uL — AB (ref 4.22–5.81)
RDW: 15.7 % — AB (ref 11.5–15.5)
WBC: 40.9 10*3/uL — AB (ref 4.0–10.5)

## 2016-09-09 LAB — BASIC METABOLIC PANEL
ANION GAP: 9 (ref 5–15)
BUN: 23 mg/dL — ABNORMAL HIGH (ref 6–20)
CALCIUM: 8 mg/dL — AB (ref 8.9–10.3)
CO2: 23 mmol/L (ref 22–32)
Chloride: 106 mmol/L (ref 101–111)
Creatinine, Ser: 1.51 mg/dL — ABNORMAL HIGH (ref 0.61–1.24)
GFR calc Af Amer: 48 mL/min — ABNORMAL LOW (ref >60)
GFR, EST NON AFRICAN AMERICAN: 41 mL/min — AB (ref >60)
Glucose, Bld: 100 mg/dL — ABNORMAL HIGH (ref 65–99)
POTASSIUM: 3.5 mmol/L (ref 3.5–5.1)
SODIUM: 138 mmol/L (ref 135–145)

## 2016-09-09 NOTE — Progress Notes (Signed)
[facility] Progress Note  Patient: Bradley Short KVQ:[id number]   PCP: Manon Hilding, MD DOB: 27-Dec-1932   DOA: 08/21/2016   DOS: 09/09/2016   Date of Service: the patient was seen and examined on 09/09/2016  Brief hospital course: Pt. with PMH of A. 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 Underwent right colectomy on 08/28/2016. After procedure developed A. fib with RVR, started on amiodarone and Cardizem, cardiology was consulted. Noticed a rash on 09/03/2016 after starting TPN and developed leukocytosis as well. Currently further plan is to monitor for improvement in rash and leukocytosis.  Subjective: feeling ok, rash starting to improve  Assessment and Plan: 1.Cecal adenocarcinoma S/P right colectomy on 08/28/2016. -underwent colonoscopy -cecal lesion suspicious for cancer, Biopsies  positive for high-grade dysplasia and suspicious for adenocarcinoma. General surgery was consulted and performed right colectomy on 08/28/2016. Developed post op ileus, since resolved. CT on 09/04/2016 shows no definite abscess or leak. - Diet has been advance diet present. Ambulate as tolerated. -improved -FU with CCS, staples to be removed Monday  2. Profound Leukocytosis, Allergic Drug rash -after receiving PICC line, patient started TPN. After that the patient started developing rash involving his abd and it spread to his thigh and his back, upper thigh. The rashes maculopapular, nonblanching. Causing itching. Suspect this is likely a drug rash from TPN/amiodarone/Reglan/Cardizem. -Rx with Benadryl, Pepcid, topical hydrocortisone cream, ultimately started -prednisone 40mg  on 7/12 and 7/13, now improving -amio rash less likely but definitley a possibility, called and d/w cards Dr.Naher, ok to hold Amio, stopped this 7/12 -Appreciate Heme and ID input, elevated WBC is  neutrophilia from allergic rash, no atypical cells noted per Heme -appreciate ID input, no clear infection -WBC and Rash finally starting to improve, CBC in am  3. History of bipolar disorder/anxiety -stable  4. Lower leg edema. Likely third spacing, continue compression stocking. -monitor, may need lasix in next few days -stop IVF  5. Hypokalemia. Replaced  6. A. fib with RVR. Started on amiodarone this admission Xarelto resumed per home dose. Cardiology was consulted currently signed off., called and d/w Dr.Nahser re rash, will hold amio and monitor  Diet: soft diet DVT Prophylaxis: subcutaneous Heparin  Advance goals of care discussion: full code  Family Communication:wife at bedside   Disposition: home Monday if stable  Consultants: Gen. surgery, GI, ID Procedures: Right colectomy 08/28/2016  Antibiotics: Anti-infectives    Start     Dose/Rate Route Frequency Ordered Stop   09/07/16 1300  fluconazole (DIFLUCAN) tablet 200 mg     200 mg Oral Daily 09/07/16 1126     09/05/16 1400  cephALEXin (KEFLEX) capsule 500 mg  Status:  Discontinued     500 mg Oral Every 6 hours 09/05/16 1259 09/06/16 0902   09/05/16 1300  cephALEXin (KEFLEX) capsule 500 mg  Status:  Discontinued     500 mg Oral Every 12 hours 09/05/16 1259 09/05/16 1259   08/28/16 1133  cefoTEtan in Dextrose 5% (CEFOTAN) 2-2.08 GM-% IVPB    Comments:  [name], Lacey   : cabinet override      08/28/16 1133 08/28/16 1145   08/28/16 0945  cefoTEtan (CEFOTAN) 2 g in dextrose 5 % 50 mL IVPB     2 g 100 mL/hr over 30 Minutes Intravenous On call to O.R. 08/28/16 0941 08/28/16 1710       Objective: Physical Exam: Vitals:  09/08/16 0631 09/08/16 1500 09/08/16 2021 09/09/16 0444  BP: (!) 112/45 (!) 113/56 (!) 120/40 (!) 117/42  Pulse: 76 78 76 76  Resp: 20 20 18 18   Temp: 98.2 F (36.8 C) 97.8 F (36.6 C) 98.4 F (36.9 C) 98.7 F (37.1 C)  TempSrc: Oral Oral Oral Oral  SpO2: 99% 98% 100% 97%  Weight:  95.7 kg (210 lb 15.7 oz)   97.2 kg (214 lb 4.6 oz)  Height:        Intake/Output Summary (Last 24 hours) at 09/09/16 1207 Last data filed at 09/09/16 0600  Gross per 24 hour  Intake             1880 ml  Output              900 ml  Net              980 ml   Filed Weights   09/07/16 0553 09/08/16 0631 09/09/16 0444  Weight: 97.9 kg (215 lb 13.3 oz) 95.7 kg (210 lb 15.7 oz) 97.2 kg (214 lb 4.6 oz)    Gen: Awake, Alert, Oriented X 3, chronically ill male HEENT: PERRLA, Neck supple, no JVD, some lid edema noted Lungs: Good air movement bilaterally, CTAB CVS: RRR,No Gallops,Rubs or new Murmurs Abd: soft, Non tender, non distended, BS present, incision healing well, staples noted Extremities: trace edema Skin: diffuse maculopapular rash extending from face/neck/chest/abd/thighs/legs, starting to improve, less inflammed and angry  Data Reviewed: CBC:  Recent Labs Lab 09/04/16 0808 09/05/16 0448 09/06/16 0403 09/07/16 0833 09/08/16 0430 09/09/16 0731  WBC 14.9* 17.9* 21.8* 38.0* 43.0* 40.9*  NEUTROABS 12.1* 14.5*  --  34.3* 39.5*  --   HGB 9.2* 9.2* 9.2* 10.6* 9.5* 9.1*  HCT 27.8* 27.7* 27.9* 31.8* 28.6* 27.9*  MCV 88.5 88.5 88.0 88.1 86.1 89.4  PLT 272 312 356 412* 378 762*   Basic Metabolic Panel:  Recent Labs Lab 09/04/16 0808 09/05/16 0448 09/07/16 0833 09/08/16 0430 09/09/16 0731  NA 140 141 132* 135 138  K 3.4* 3.2* 4.0 3.9 3.5  CL 108 107 100* 104 106  CO2 25 24 22 24 23   GLUCOSE 134* 142* 149* 143* 100*  BUN 9 9 17 20  23*  CREATININE 1.24 1.22 1.49* 1.44* 1.51*  CALCIUM 8.2* 8.3* 8.3* 8.3* 8.0*  MG 2.3  --   --   --   --   PHOS 2.8  --   --   --   --     Liver Function Tests:  Recent Labs Lab 09/04/16 0808 09/07/16 0833 09/08/16 0430  AST 72* 41 31  ALT 94* 66* 61  ALKPHOS 116 102 92  BILITOT 0.6 0.8 0.7  PROT 6.0* 6.3* 5.6*  ALBUMIN 2.5* 2.7* 2.4*   No results for input(s): LIPASE, AMYLASE in the last 168 hours. No results for input(s):  AMMONIA in the last 168 hours. Coagulation Profile: No results for input(s): INR, PROTIME in the last 168 hours. Cardiac Enzymes: No results for input(s): CKTOTAL, CKMB, CKMBINDEX, TROPONINI in the last 168 hours. BNP (last 3 results) No results for input(s): PROBNP in the last 8760 hours. CBG:  Recent Labs Lab 09/04/16 1148 09/04/16 1829 09/05/16 0011 09/05/16 0618 09/05/16 1237  GLUCAP 119* 131* 138* 161* 106*   Studies: No results found.  Scheduled Meds: . calamine   Topical BID  . diphenhydrAMINE  25 mg Oral TID  . famotidine  20 mg Oral BID  . feeding supplement (ENSURE ENLIVE)  237 mL Oral BID BM  . fluconazole  200 mg Oral Daily  . hydrocortisone   Topical Daily  . rivaroxaban  20 mg Oral Q supper   Continuous Infusions: . sodium chloride 50 mL/hr at 09/09/16 0734   PRN Meds: hydrOXYzine, iopamidol, LORazepam, [DISCONTINUED] ondansetron **OR** ondansetron (ZOFRAN) IV, sodium chloride flush  Time spent: 35 minutes  Author: Domenic Polite, MD [facility] Pager: [phone] 09/09/2016 12:07 PM  If 7PM-7AM, please contact night-coverage at [email or web address], password [facility]

## 2016-09-09 NOTE — Plan of Care (Signed)
Problem: Skin Integrity: Goal: Risk for impaired skin integrity will decrease Outcome: Not Progressing Pt is experiencing rash covering most of his body.

## 2016-09-09 NOTE — Plan of Care (Signed)
Problem: Skin Integrity: Goal: Risk for impaired skin integrity will decrease Outcome: Progressing Rash improving

## 2016-09-10 LAB — CBC WITH DIFFERENTIAL/PLATELET
BASOS ABS: 0 10*3/uL (ref 0.0–0.1)
Basophils Relative: 0 %
Eosinophils Absolute: 1.9 10*3/uL — ABNORMAL HIGH (ref 0.0–0.7)
Eosinophils Relative: 8 %
HCT: 28.7 % — ABNORMAL LOW (ref 39.0–52.0)
Hemoglobin: 9.4 g/dL — ABNORMAL LOW (ref 13.0–17.0)
LYMPHS ABS: 3.1 10*3/uL (ref 0.7–4.0)
LYMPHS PCT: 13 %
MCH: 28.7 pg (ref 26.0–34.0)
MCHC: 32.8 g/dL (ref 30.0–36.0)
MCV: 87.8 fL (ref 78.0–100.0)
Monocytes Absolute: 1 10*3/uL (ref 0.1–1.0)
Monocytes Relative: 4 %
NEUTROS ABS: 18.1 10*3/uL — AB (ref 1.7–7.7)
Neutrophils Relative %: 75 %
PLATELETS: 441 10*3/uL — AB (ref 150–400)
RBC: 3.27 MIL/uL — ABNORMAL LOW (ref 4.22–5.81)
RDW: 15.8 % — ABNORMAL HIGH (ref 11.5–15.5)
WBC MORPHOLOGY: INCREASED
WBC: 24.1 10*3/uL — ABNORMAL HIGH (ref 4.0–10.5)

## 2016-09-10 LAB — BASIC METABOLIC PANEL
Anion gap: 7 (ref 5–15)
BUN: 21 mg/dL — AB (ref 6–20)
CO2: 23 mmol/L (ref 22–32)
CREATININE: 1.47 mg/dL — AB (ref 0.61–1.24)
Calcium: 7.7 mg/dL — ABNORMAL LOW (ref 8.9–10.3)
Chloride: 108 mmol/L (ref 101–111)
GFR calc Af Amer: 49 mL/min — ABNORMAL LOW (ref >60)
GFR, EST NON AFRICAN AMERICAN: 43 mL/min — AB (ref >60)
Glucose, Bld: 110 mg/dL — ABNORMAL HIGH (ref 65–99)
Potassium: 3.6 mmol/L (ref 3.5–5.1)
SODIUM: 138 mmol/L (ref 135–145)

## 2016-09-10 MED ORDER — CARVEDILOL 3.125 MG PO TABS
3.1250 mg | ORAL_TABLET | Freq: Once | ORAL | Status: AC
  Administered 2016-09-10: 3.125 mg via ORAL
  Filled 2016-09-10: qty 1

## 2016-09-10 MED ORDER — CARVEDILOL 12.5 MG PO TABS
12.5000 mg | ORAL_TABLET | Freq: Two times a day (BID) | ORAL | Status: DC
  Administered 2016-09-10 – 2016-09-12 (×4): 12.5 mg via ORAL
  Filled 2016-09-10 (×5): qty 1

## 2016-09-10 MED ORDER — CARVEDILOL 6.25 MG PO TABS
6.2500 mg | ORAL_TABLET | Freq: Two times a day (BID) | ORAL | Status: DC
  Administered 2016-09-10: 6.25 mg via ORAL
  Filled 2016-09-10: qty 1

## 2016-09-10 NOTE — Progress Notes (Signed)
[facility] Progress Note  Patient: Bradley Short BMW:[id number]   PCP: Manon Hilding, MD DOB: 1932/09/07   DOA: 08/21/2016   DOS: 09/10/2016   Date of Service: the patient was seen and examined on 09/10/2016  Brief hospital course: Pt. with PMH of A. 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 Underwent right colectomy on 08/28/2016. After procedure developed A. fib with RVR, started on amiodarone and Cardizem, cardiology was consulted. Noticed a rash on 09/03/2016 after starting TPN and developed leukocytosis as well. Currently further plan is to monitor for improvement in rash and leukocytosis.  Subjective: feeling ok, rash starting to improve  Assessment and Plan: 1.Cecal adenocarcinoma S/P right colectomy on 08/28/2016. -underwent colonoscopy -cecal lesion suspicious for cancer, Biopsies  positive for high-grade dysplasia and suspicious for adenocarcinoma. -General surgery was consulted and performed right colectomy on 08/28/2016. -Developed post op ileus, since resolved. CT on 09/04/2016 shows no definite abscess or leak. - Diet has been advance diet present. Ambulate as tolerated. -improved -FU with CCS, staples to be removed Monday  2. Profound Leukocytosis, Allergic Drug rash -after receiving PICC line, patient started TPN. After that the patient started developing rash involving his abd and it spread to his thigh and his back, upper thigh. The rashes maculopapular, nonblanching. Causing itching. Suspect this is likely a drug rash from Amiodarone vs Cardizem. -Cardizem stopped -Rx with Benadryl, Pepcid, topical hydrocortisone cream, ultimately started -prednisone 40mg  on 7/12 and 7/13, now improving -amio rash less likely but definitley a possibility, called and d/w cards Dr.Naher, ok to hold Amio, stopped this 7/12 -Appreciate Heme and ID input, elevated  WBC is neutrophilia from allergic rash, no atypical cells noted per Heme -appreciate ID input, no clear infection -WBC and Rash finally improving  3. History of bipolar disorder/anxiety -stable  4. Lower leg edema. Likely third spacing, continue compression stocking. -monitor, may need lasix in next few days -stop IVF  5. Hypokalemia. Replaced  6. A. fib with RVR. -noted earlier this admission -started on Cardizem gtt and then Amiodarone this admission -Xarelto resumed per home dose. -Stopped Cardizem 7/6 and then stopped Amio 7/12 due to rash, now with RVR -will add low dose Carvedilol as BP tolerates and monitor, d/w Cards in am -h/o previous similar rash about 8-10years back after taking Cardizem/metoprolol/lisinopril, suspect Cardizem likely culprit  Diet: soft diet DVT Prophylaxis: subcutaneous Heparin  Advance goals of care discussion: full code  Family Communication:wife at bedside   Disposition: home in 1-2days if stable  Consultants: Gen. surgery, GI, ID Procedures: Right colectomy 08/28/2016  Antibiotics: Anti-infectives    Start     Dose/Rate Route Frequency Ordered Stop   09/07/16 1300  fluconazole (DIFLUCAN) tablet 200 mg     200 mg Oral Daily 09/07/16 1126     09/05/16 1400  cephALEXin (KEFLEX) capsule 500 mg  Status:  Discontinued     500 mg Oral Every 6 hours 09/05/16 1259 09/06/16 0902   09/05/16 1300  cephALEXin (KEFLEX) capsule 500 mg  Status:  Discontinued     500 mg Oral Every 12 hours 09/05/16 1259 09/05/16 1259   08/28/16 1133  cefoTEtan in Dextrose 5% (CEFOTAN) 2-2.08 GM-% IVPB    Comments:  [name], Lacey   : cabinet override      08/28/16 1133 08/28/16 1145   08/28/16 0945  cefoTEtan (CEFOTAN) 2 g in dextrose 5 % 50  mL IVPB     2 g 100 mL/hr over 30 Minutes Intravenous On call to O.R. 08/28/16 0941 08/28/16 1710       Objective: Physical Exam: Vitals:   09/09/16 1315 09/09/16 2004 09/10/16 0415 09/10/16 1041  BP: (!) 102/42 (!)  106/50 (!) 100/50 (!) 101/49  Pulse: 70 (!) 127 (!) 114 (!) 118  Resp: 18 18 18    Temp: 97.9 F (36.6 C) 98.1 F (36.7 C) 98.2 F (36.8 C)   TempSrc: Oral Oral Oral   SpO2: 100% 100% 96%   Weight:   96.2 kg (212 lb 1.3 oz)   Height:        Intake/Output Summary (Last 24 hours) at 09/10/16 1244 Last data filed at 09/10/16 0800  Gross per 24 hour  Intake              480 ml  Output             1575 ml  Net            -1095 ml   Filed Weights   09/08/16 0631 09/09/16 0444 09/10/16 0415  Weight: 95.7 kg (210 lb 15.7 oz) 97.2 kg (214 lb 4.6 oz) 96.2 kg (212 lb 1.3 oz)    Gen: Awake, Alert, Oriented X 3, chronically ill male HEENT: PERRLA, Neck supple, no JVD, some lid edema noted Lungs: Good air movement bilaterally, CTAB CVS: irregular and rapid,No Gallops,Rubs or new Murmurs Abd: soft, Non tender, non distended, BS present, incision healing well, staples noted Extremities: trace edema Skin: diffuse maculopapular rash extending from face/neck/chest/abd/thighs/legs, starting to improve, less inflammed and angry-much improved  Data Reviewed: CBC:  Recent Labs Lab 09/04/16 0808 09/05/16 0448 09/06/16 0403 09/07/16 0833 09/08/16 0430 09/09/16 0731 09/10/16 0444  WBC 14.9* 17.9* 21.8* 38.0* 43.0* 40.9* 24.1*  NEUTROABS 12.1* 14.5*  --  34.3* 39.5*  --  18.1*  HGB 9.2* 9.2* 9.2* 10.6* 9.5* 9.1* 9.4*  HCT 27.8* 27.7* 27.9* 31.8* 28.6* 27.9* 28.7*  MCV 88.5 88.5 88.0 88.1 86.1 89.4 87.8  PLT 272 312 356 412* 378 413* 354*   Basic Metabolic Panel:  Recent Labs Lab 09/04/16 0808 09/05/16 0448 09/07/16 0833 09/08/16 0430 09/09/16 0731 09/10/16 0444  NA 140 141 132* 135 138 138  K 3.4* 3.2* 4.0 3.9 3.5 3.6  CL 108 107 100* 104 106 108  CO2 25 24 22 24 23 23   GLUCOSE 134* 142* 149* 143* 100* 110*  BUN 9 9 17 20  23* 21*  CREATININE 1.24 1.22 1.49* 1.44* 1.51* 1.47*  CALCIUM 8.2* 8.3* 8.3* 8.3* 8.0* 7.7*  MG 2.3  --   --   --   --   --   PHOS 2.8  --   --   --   --    --     Liver Function Tests:  Recent Labs Lab 09/04/16 0808 09/07/16 0833 09/08/16 0430  AST 72* 41 31  ALT 94* 66* 61  ALKPHOS 116 102 92  BILITOT 0.6 0.8 0.7  PROT 6.0* 6.3* 5.6*  ALBUMIN 2.5* 2.7* 2.4*   No results for input(s): LIPASE, AMYLASE in the last 168 hours. No results for input(s): AMMONIA in the last 168 hours. Coagulation Profile: No results for input(s): INR, PROTIME in the last 168 hours. Cardiac Enzymes: No results for input(s): CKTOTAL, CKMB, CKMBINDEX, TROPONINI in the last 168 hours. BNP (last 3 results) No results for input(s): PROBNP in the last 8760 hours. CBG:  Recent Labs Lab 09/04/16  1148 09/04/16 1829 09/05/16 0011 09/05/16 0618 09/05/16 1237  GLUCAP 119* 131* 138* 161* 106*   Studies: No results found.  Scheduled Meds: . calamine   Topical BID  . carvedilol  6.25 mg Oral BID WC  . diphenhydrAMINE  25 mg Oral TID  . famotidine  20 mg Oral BID  . feeding supplement (ENSURE ENLIVE)  237 mL Oral BID BM  . fluconazole  200 mg Oral Daily  . hydrocortisone   Topical Daily  . rivaroxaban  20 mg Oral Q supper   Continuous Infusions:  PRN Meds: hydrOXYzine, iopamidol, LORazepam, [DISCONTINUED] ondansetron **OR** ondansetron (ZOFRAN) IV, sodium chloride flush  Time spent: 35 minutes  Author: Domenic Polite, MD [facility] Pager: [phone] 09/10/2016 12:44 PM  If 7PM-7AM, please contact night-coverage at [email or web address], password [facility]

## 2016-09-11 LAB — BASIC METABOLIC PANEL
ANION GAP: 8 (ref 5–15)
BUN: 20 mg/dL (ref 6–20)
CHLORIDE: 108 mmol/L (ref 101–111)
CO2: 23 mmol/L (ref 22–32)
Calcium: 7.9 mg/dL — ABNORMAL LOW (ref 8.9–10.3)
Creatinine, Ser: 1.4 mg/dL — ABNORMAL HIGH (ref 0.61–1.24)
GFR calc Af Amer: 52 mL/min — ABNORMAL LOW (ref >60)
GFR, EST NON AFRICAN AMERICAN: 45 mL/min — AB (ref >60)
GLUCOSE: 112 mg/dL — AB (ref 65–99)
POTASSIUM: 3.5 mmol/L (ref 3.5–5.1)
Sodium: 139 mmol/L (ref 135–145)

## 2016-09-11 LAB — CBC
HEMATOCRIT: 26.5 % — AB (ref 39.0–52.0)
Hemoglobin: 8.7 g/dL — ABNORMAL LOW (ref 13.0–17.0)
MCH: 29 pg (ref 26.0–34.0)
MCHC: 32.8 g/dL (ref 30.0–36.0)
MCV: 88.3 fL (ref 78.0–100.0)
PLATELETS: 400 10*3/uL (ref 150–400)
RBC: 3 MIL/uL — AB (ref 4.22–5.81)
RDW: 15.7 % — ABNORMAL HIGH (ref 11.5–15.5)
WBC: 18.9 10*3/uL — AB (ref 4.0–10.5)

## 2016-09-11 MED ORDER — ZOLPIDEM TARTRATE 5 MG PO TABS
5.0000 mg | ORAL_TABLET | Freq: Every evening | ORAL | Status: DC | PRN
  Administered 2016-09-11: 5 mg via ORAL
  Filled 2016-09-11: qty 1

## 2016-09-11 MED ORDER — FUROSEMIDE 40 MG PO TABS
40.0000 mg | ORAL_TABLET | Freq: Once | ORAL | Status: AC
  Administered 2016-09-11: 40 mg via ORAL
  Filled 2016-09-11: qty 1

## 2016-09-11 MED ORDER — POTASSIUM CHLORIDE CRYS ER 20 MEQ PO TBCR
40.0000 meq | EXTENDED_RELEASE_TABLET | Freq: Once | ORAL | Status: AC
  Administered 2016-09-11: 40 meq via ORAL
  Filled 2016-09-11: qty 2

## 2016-09-11 NOTE — Progress Notes (Addendum)
[facility] Progress Note  Patient: Bradley Short HEN:[id number]   PCP: Manon Hilding, MD DOB: 05-10-1932   DOA: 08/21/2016   DOS: 09/11/2016   Date of Service: the patient was seen and examined on 09/11/2016  Brief hospital course: Pt. with PMH of A. 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 Underwent right colectomy on 08/28/2016. After procedure developed A. fib with RVR, started on amiodarone and Cardizem, cardiology was consulted. Noticed a rash on 09/03/2016 after starting TPN and developed leukocytosis as well. Currently further plan is to monitor for improvement in rash and leukocytosis. Stopped amiodarone, given Prednisone, rash improving, back in Afib RVR again, HR improving now  Subjective: feeling ok, rash starting to improve, puffy all over  Assessment and Plan: 1.Cecal adenocarcinoma S/P right colectomy on 08/28/2016. -underwent colonoscopy -cecal lesion suspicious for cancer, Biopsies  positive for high-grade dysplasia and suspicious for adenocarcinoma. -General surgery was consulted and performed right colectomy on 08/28/2016. -Developed post op ileus, since resolved. CT on 09/04/2016 shows no definite abscess or leak. - Diet has been advance diet present. Ambulate as tolerated. -improved -staples removed today, ok for DC from CCS standpoint  2. Profound Leukocytosis, Allergic Drug rash -after receiving PICC line, patient started TPN. After that the patient started developing rash involving his abd and it spread to his thigh and his back, upper thigh. The rashes maculopapular, nonblanching. Causing itching. Suspect this is likely a drug rash from Amiodarone vs Cardizem. -Cardizem stopped 7/7 -Rx with Benadryl, Pepcid, topical hydrocortisone cream, ultimately started -prednisone 40mg  on 7/12 and 7/13, now improving -amio rash less likely but  definitley a possibility, called and d/w cards Dr.Nahser, ok to hold Amio, stopped this 7/12 -Appreciate Heme and ID input, elevated WBC is neutrophilia from allergic rash, no atypical cells noted per Heme -appreciate ID input, no clear infection -WBC and Rash finally improving -DC benadryl  3. History of bipolar disorder/anxiety -stable  4. Lower leg edema/Third spacing -continue compression stocking. -off IVF, PO lasix today  5. Hypokalemia. Replaced  6. A. fib with RVR. -noted earlier this admission -started on Cardizem gtt and then Amiodarone this admission -Xarelto resumed per home dose. -Stopped Cardizem 7/6 and then stopped Amio 7/12 due to rash, then back on Afib with RVR 7/15, started on Carvedilol due to limited options and severe rash while on Cardizem/amio and previous rash while on Metop/XCardizem/lisinopril 8-10years ago. -HR improved, remains on Carvedilol now 12.5mg  BID  Diet: soft diet DVT Prophylaxis: subcutaneous Heparin  Advance goals of care discussion: full code Family Communication:wife at bedside  Disposition: home tomorrow if stable  Consultants: Gen. surgery, GI, ID Procedures: Right colectomy 08/28/2016  Antibiotics: Anti-infectives    Start     Dose/Rate Route Frequency Ordered Stop   09/07/16 1300  fluconazole (DIFLUCAN) tablet 200 mg     200 mg Oral Daily 09/07/16 1126     09/05/16 1400  cephALEXin (KEFLEX) capsule 500 mg  Status:  Discontinued     500 mg Oral Every 6 hours 09/05/16 1259 09/06/16 0902   09/05/16 1300  cephALEXin (KEFLEX) capsule 500 mg  Status:  Discontinued     500 mg Oral Every 12 hours 09/05/16 1259 09/05/16 1259   08/28/16 1133  cefoTEtan in Dextrose 5% (CEFOTAN) 2-2.08 GM-% IVPB    Comments:  [name], Lacey   : cabinet override      08/28/16 1133  08/28/16 1145   08/28/16 0945  cefoTEtan (CEFOTAN) 2 g in dextrose 5 % 50 mL IVPB     2 g 100 mL/hr over 30 Minutes Intravenous On call to O.R. 08/28/16 0941 08/28/16 1710         Objective: Physical Exam: Vitals:   09/10/16 1357 09/10/16 2139 09/10/16 2233 09/11/16 0531  BP: (!) 91/54 (!) 95/52  (!) 107/42  Pulse: (!) 117 (!) 111 80 (!) 118  Resp:  18  18  Temp: 97.6 F (36.4 C) 98.7 F (37.1 C)  98.7 F (37.1 C)  TempSrc: Oral Oral  Oral  SpO2: 100% 97%  99%  Weight:    95.4 kg (210 lb 5.1 oz)  Height:        Intake/Output Summary (Last 24 hours) at 09/11/16 1247 Last data filed at 09/11/16 1000  Gross per 24 hour  Intake              355 ml  Output              600 ml  Net             -245 ml   Filed Weights   09/09/16 0444 09/10/16 0415 09/11/16 0531  Weight: 97.2 kg (214 lb 4.6 oz) 96.2 kg (212 lb 1.3 oz) 95.4 kg (210 lb 5.1 oz)    Gen: Awake, Alert, Oriented X 3, chronically ill male HEENT: PERRLA, Neck supple, no JVD Lungs: Good air movement bilaterally, CTAB CVS: RRR,No Gallops,Rubs or new Murmurs Abd: soft, Non tender, non distended, BS present, incision healing well Extremities: No Cyanosis, Clubbing or edema Skin: diffuse maculopapular rash much improved, now predom in lower legs, face/trunk/back/abd -much improved  Data Reviewed: CBC:  Recent Labs Lab 09/05/16 0448  09/07/16 0833 09/08/16 0430 09/09/16 0731 09/10/16 0444 09/11/16 0428  WBC 17.9*  < > 38.0* 43.0* 40.9* 24.1* 18.9*  NEUTROABS 14.5*  --  34.3* 39.5*  --  18.1*  --   HGB 9.2*  < > 10.6* 9.5* 9.1* 9.4* 8.7*  HCT 27.7*  < > 31.8* 28.6* 27.9* 28.7* 26.5*  MCV 88.5  < > 88.1 86.1 89.4 87.8 88.3  PLT 312  < > 412* 378 413* 441* 400  < > = values in this interval not displayed. Basic Metabolic Panel:  Recent Labs Lab 09/07/16 0833 09/08/16 0430 09/09/16 0731 09/10/16 0444 09/11/16 0428  NA 132* 135 138 138 139  K 4.0 3.9 3.5 3.6 3.5  CL 100* 104 106 108 108  CO2 22 24 23 23 23   GLUCOSE 149* 143* 100* 110* 112*  BUN 17 20 23* 21* 20  CREATININE 1.49* 1.44* 1.51* 1.47* 1.40*  CALCIUM 8.3* 8.3* 8.0* 7.7* 7.9*    Liver Function Tests:  Recent  Labs Lab 09/07/16 0833 09/08/16 0430  AST 41 31  ALT 66* 61  ALKPHOS 102 92  BILITOT 0.8 0.7  PROT 6.3* 5.6*  ALBUMIN 2.7* 2.4*   No results for input(s): LIPASE, AMYLASE in the last 168 hours. No results for input(s): AMMONIA in the last 168 hours. Coagulation Profile: No results for input(s): INR, PROTIME in the last 168 hours. Cardiac Enzymes: No results for input(s): CKTOTAL, CKMB, CKMBINDEX, TROPONINI in the last 168 hours. BNP (last 3 results) No results for input(s): PROBNP in the last 8760 hours. CBG:  Recent Labs Lab 09/04/16 1829 09/05/16 0011 09/05/16 0618 09/05/16 1237  GLUCAP 131* 138* 161* 106*   Studies: No results found.  Scheduled Meds: .  calamine   Topical BID  . carvedilol  12.5 mg Oral BID WC  . famotidine  20 mg Oral BID  . feeding supplement (ENSURE ENLIVE)  237 mL Oral BID BM  . fluconazole  200 mg Oral Daily  . hydrocortisone   Topical Daily  . rivaroxaban  20 mg Oral Q supper   Continuous Infusions:  PRN Meds: iopamidol, [DISCONTINUED] ondansetron **OR** ondansetron (ZOFRAN) IV, sodium chloride flush, zolpidem  Time spent: 35 minutes  Author: Domenic Polite, MD [facility] Pager: [phone] 09/11/2016 12:47 PM  If 7PM-7AM, please contact night-coverage at [email or web address], password [facility]

## 2016-09-11 NOTE — Progress Notes (Signed)
Patient ID: [id number], male   DOB: [birth year], 83 y.o.   MRN: [id number]  [facility] Surgery Progress Note  14 Days Post-Op  Subjective: CC- rash Patient went into a fib RVR again over the weekend, on carvedilol. HR improving. Tolerating soft diet. Having good bowel function. Appetite still not quite back to normal, but improving. Rash significantly improved, and WBC trending down.  Objective: Vital signs in last 24 hours: Temp:  [97.6 F (36.4 C)-98.7 F (37.1 C)] 98.7 F (37.1 C) (07/16 0531) Pulse Rate:  [80-118] 118 (07/16 0531) Resp:  [18] 18 (07/16 0531) BP: (91-107)/(42-54) 107/42 (07/16 0531) SpO2:  [97 %-100 %] 99 % (07/16 0531) Weight:  [210 lb 5.1 oz (95.4 kg)] 210 lb 5.1 oz (95.4 kg) (07/16 0531) Last BM Date: 09/10/16  Intake/Output from previous day: 07/15 0701 - 07/16 0700 In: 235 [P.O.:235] Out: 600 [Urine:600] Intake/Output this shift: No intake/output data recorded.  PE: Gen: Alert, NAD, pleasant HEENT: EOM's intact, pupils equal  Pulm: CTAB, no W/R/R, effort normal Abd: Soft, nondistended, midlineincision C/D/I with staples intact, rash improving >> staples removed Psych: A&Ox3  BLE: 1+ pitting edema BLE, no calf tenderness  Lab Results:   Recent Labs  09/10/16 0444 09/11/16 0428  WBC 24.1* 18.9*  HGB 9.4* 8.7*  HCT 28.7* 26.5*  PLT 441* 400   BMET  Recent Labs  09/10/16 0444 09/11/16 0428  NA 138 139  K 3.6 3.5  CL 108 108  CO2 23 23  GLUCOSE 110* 112*  BUN 21* 20  CREATININE 1.47* 1.40*  CALCIUM 7.7* 7.9*   PT/INR No results for input(s): LABPROT, INR in the last 72 hours. CMP     Component Value Date/Time   NA 139 09/11/2016 0428   K 3.5 09/11/2016 0428   CL 108 09/11/2016 0428   CO2 23 09/11/2016 0428   GLUCOSE 112 (H) 09/11/2016 0428   BUN 20 09/11/2016 0428   CREATININE 1.40 (H) 09/11/2016 0428   CALCIUM 7.9 (L) 09/11/2016 0428   PROT 5.6 (L) 09/08/2016 0430   ALBUMIN 2.4 (L) 09/08/2016 0430   AST 31  09/08/2016 0430   ALT 61 09/08/2016 0430   ALKPHOS 92 09/08/2016 0430   BILITOT 0.7 09/08/2016 0430   GFRNONAA 45 (L) 09/11/2016 0428   GFRAA 52 (L) 09/11/2016 0428   Lipase  No results found for: LIPASE     Studies/Results: No results found.  Anti-infectives: Anti-infectives    Start     Dose/Rate Route Frequency Ordered Stop   09/07/16 1300  fluconazole (DIFLUCAN) tablet 200 mg     200 mg Oral Daily 09/07/16 1126     09/05/16 1400  cephALEXin (KEFLEX) capsule 500 mg  Status:  Discontinued     500 mg Oral Every 6 hours 09/05/16 1259 09/06/16 0902   09/05/16 1300  cephALEXin (KEFLEX) capsule 500 mg  Status:  Discontinued     500 mg Oral Every 12 hours 09/05/16 1259 09/05/16 1259   08/28/16 1133  cefoTEtan in Dextrose 5% (CEFOTAN) 2-2.08 GM-% IVPB    Comments:  [name], Lacey   : cabinet override      08/28/16 1133 08/28/16 1145   08/28/16 0945  cefoTEtan (CEFOTAN) 2 g in dextrose 5 % 50 mL IVPB     2 g 100 mL/hr over 30 Minutes Intravenous On call to O.R. 08/28/16 0941 08/28/16 1710       Assessment/Plan Acute lower GIB - etiology diverticular disease vs malignancy  S/P COLONOSCOPY 6/28: Cecal/appendiceal  orifice mass - BX shows high grade dysplasia with small focus conerning for adenocarcinoma  S/P LAPAROSCOPIC ASSISTED RIGHT COLECTOMY 08/28/16 Dr. Autumn Messing III - POD 114 - surgical pathology: SESSILE SERRATED ADENOMA WITH HIGH GRADE DYSPLASIA, 21 BENIGN LYMPH NODES, MARGINS UNINVOLVED BY NEOPLASIA, DIVERTICULAR DISEASE, APPENDIX WITH NO DIAGNOSTIC ABNORMALITY, SMALL BOWEL WITH NO DIAGNOSTIC ABNORMALITY - CT 7/9 showed no definite abscess or leak, possibly some colitis - tolerating soft diet and having bowel function  Rash/leukocytosis - appreciate ID consult and oncology input. Likely drug rash, and is causing the leukocytosis  Hypokalemia - resolved ABL anemia- hgb 8.7, stable Paroxysmal atrial fibrillation - On oral amiodarone and xarelto. Went into RVR over  weekend and started on carvedilol HLD CKD, stage III  Gout - allopurinol  FEN- soft diet ID- perioperative cefotetan 7/2, keflex 7/10>7/11.  VTE- SCD's, xarelto  Plan - staples removed. Patient tolerating soft diet. Continue PT/ambulation. Ready for discharge from surgical standpoint. Will follow-up in clinic in 2-3 weeks. General surgery will sign off, please call with concerns.   LOS: 21 days    Jerrye Beavers , [facility] Surgery 09/11/2016, 10:24 AM Pager: [phone] Consults: [phone] Mon-Fri 7:00 am-4:30 pm Sat-Sun 7:00 am-11:30 am

## 2016-09-11 NOTE — Care Management Note (Signed)
Case Management Note  Patient Details  Name: Asheton Scheffler MRN: [id number] Date of Birth: 1932-06-09  Subjective/Objective:  83 y/o m admitted w/Acute GI. From home w/spouse. PT recc HHPT. [place] chosen-rep Kim aware. [place] orders already placed. No further CM needs.                  Action/Plan:d/c home w/HHC.   Expected Discharge Date:                  Expected Discharge Plan:  [place]  In-House Referral:     Discharge planning Services  CM Consult  Post Acute Care Choice:    Choice offered to:  Spouse  DME Arranged:    DME Agency:     HH Arranged:  PT [place]:  [place]  Status of Service:  In process, will continue to follow  If discussed at Long Length of Stay Meetings, dates discussed:    Additional Comments:  Dessa Phi, RN 09/11/2016, 4:13 PM

## 2016-09-12 LAB — BASIC METABOLIC PANEL
Anion gap: 8 (ref 5–15)
BUN: 18 mg/dL (ref 6–20)
CALCIUM: 8 mg/dL — AB (ref 8.9–10.3)
CHLORIDE: 106 mmol/L (ref 101–111)
CO2: 24 mmol/L (ref 22–32)
CREATININE: 1.4 mg/dL — AB (ref 0.61–1.24)
GFR calc Af Amer: 52 mL/min — ABNORMAL LOW (ref >60)
GFR calc non Af Amer: 45 mL/min — ABNORMAL LOW (ref >60)
Glucose, Bld: 108 mg/dL — ABNORMAL HIGH (ref 65–99)
Potassium: 3.3 mmol/L — ABNORMAL LOW (ref 3.5–5.1)
SODIUM: 138 mmol/L (ref 135–145)

## 2016-09-12 LAB — CBC
HCT: 28 % — ABNORMAL LOW (ref 39.0–52.0)
HEMOGLOBIN: 8.9 g/dL — AB (ref 13.0–17.0)
MCH: 27.5 pg (ref 26.0–34.0)
MCHC: 31.8 g/dL (ref 30.0–36.0)
MCV: 86.4 fL (ref 78.0–100.0)
Platelets: 410 10*3/uL — ABNORMAL HIGH (ref 150–400)
RBC: 3.24 MIL/uL — ABNORMAL LOW (ref 4.22–5.81)
RDW: 15.4 % (ref 11.5–15.5)
WBC: 16.1 10*3/uL — ABNORMAL HIGH (ref 4.0–10.5)

## 2016-09-12 MED ORDER — CARVEDILOL 12.5 MG PO TABS: 12.5000 mg | ORAL_TABLET | Freq: Two times a day (BID) | ORAL | 0 refills | Status: DC

## 2016-09-12 MED ORDER — BOOST / RESOURCE BREEZE PO LIQD: 1.0000 | ORAL | Status: DC

## 2016-09-12 MED ORDER — ZOLPIDEM TARTRATE 5 MG PO TABS: 5.0000 mg | ORAL_TABLET | Freq: Every evening | ORAL | 0 refills | Status: DC | PRN

## 2016-09-12 NOTE — Care Management Important Message (Signed)
Important Message  Patient Details  Name: Bradley Short MRN: [id number] Date of Birth: April 01, 1932   Medicare Important Message Given:  Yes    Kerin Salen 09/12/2016, 11:13 AMImportant Message  Patient Details  Name: Bradley Short MRN: [id number] Date of Birth: 08-07-1932   Medicare Important Message Given:  Yes    Kerin Salen 09/12/2016, 11:13 AM

## 2016-09-12 NOTE — Discharge Summary (Signed)
Physician Discharge Summary  Bradley Short WJX:[id number] DOB: 1932-08-09 DOA: 08/21/2016  PCP: Manon Hilding, MD  Admit date: 08/21/2016 Discharge date: 09/12/2016  Time spent: 54minutes  Recommendations for Outpatient Follow-up:  1. PCP in 1 week 2. CCS 7/26  3. Cards Dr.Allred in 10days appt on 7/27   Discharge Diagnoses:    Cecal adenocarcinoma   Severe Drug Rash to Diltiazem vs Amiodarone   Acute GI bleeding   Afib with RVR   Pure hypercholesterolemia   ATRIAL FIBRILLATION   Acute blood loss anemia   Gout   Leukocytosis   Thrombocytopenia (HCC)   CKD (chronic kidney disease), stage III   Syncope, vasovagal   H/O colectomy   Lower extremity edema   Discharge Condition: stable  Diet recommendation: heart healthy  Filed Weights   09/10/16 0415 09/11/16 0531 09/12/16 0556  Weight: 96.2 kg (212 lb 1.3 oz) 95.4 kg (210 lb 5.1 oz) 93.7 kg (206 lb 9.1 oz)    History of present illness:  Pt. with PMH of A. fib on Xarelto, OSA on C Pap, gout; admitted on 08/21/2016, presented with complaint of BRBPR  Hospital Course:  Pt. with PMH of A. 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 Underwent right colectomy on 08/28/2016. After procedure developed A. fib with RVR, started on amiodarone and Cardizem, cardiology was consulted. Then was also started on TPN post op, then developed an extensive very itching rash on 09/03/2016 and developed severe leukocytosis as well from this drug reaction/rash. FInally TPN, Cardizem and Amio stopped, started Topical and finally systemic steroids and now improving Then back in Afib RVR again, started Carvedilol this time due to issues with rash while on the other meds for AFib.  Detailed Course as below: 1.Cecal adenocarcinoma S/P right colectomy on 08/28/2016. -Admitted with lower GI bleed, underwent colonoscopy -cecal lesion  suspicious for cancer, Biopsies  positive for high-grade dysplasia and suspicious for adenocarcinoma. -General surgery was consulted and performed right colectomy on 08/28/2016. -Developed post op ileus, started TNA, since resolved. CT on 09/04/2016 shows no definite abscess or leak. - Diet has been advanced, staples removed, DC home FU with CCS  2. Profound Leukocytosis, Allergic Drug rash -on 09/03/16 patient started developing a mild rash which over 1 week became very extensive involving his abdomen/back and it spread to his thigh , lower leg, face/neck scalp, rashes maculopapular, nonblanching causing intense itching and profound leukocytosis WBC upto 43K -Suspect this is likely a drug rash from Amiodarone vs Cardizem. -Cardizem stopped 7/9 -Rx with Benadryl, Pepcid, topical hydrocortisone cream, ultimately started -prednisone 40mg  on 7/12 and 7/13, now improving -amio rash less likely but definitley a possibility, called and d/w cards Dr.Nahser, ok to stopAmio, stopped this 7/12 -Appreciate Heme and ID input, elevated WBC is neutrophilia from allergic rash, no atypical cells noted per Hematology -Also consulted ID, no infection noted -WBC and Rash finally improving, WBC down to 16 now  3. A. fib with RVR. -noted earlier this admission -started on Cardizem gtt and then Amiodarone this admission -Xarelto resumed per home dose. -Stopped Cardizem 7/9 and then stopped Amio 7/12 due to rash, then went  back to  Afib with RVR on 7/15, started on Carvedilol due to limited options and severe rash while on Cardizem/amio and previous rash while on Metop/Cardizem/lisinopril 8-10years ago. -HR improved, remains on Carvedilol now 12.5mg  BID, rate controlled Afib -FU with  Cards, Dr.Allred  4. History of bipolar disorder/anxiety -stable  5. Lower leg edema/Third spacing -continue compression stocking. -briefly given few doses of Po lasix inpatient, stable now off diuretics   6.  Hypokalemia. Replaced   Consultations:  [facility]  [facility]  Heme Dr.Yan  ID Dr.Comer  Discharge Exam: Vitals:   09/11/16 2055 09/12/16 0556  BP: (!) 100/44 (!) 114/47  Pulse: 60 86  Resp: 14 16  Temp: 98.4 F (36.9 C) 98.3 F (36.8 C)    General: AAOx3 Cardiovascular: S1S2/RRR Respiratory: CTAB  Discharge Instructions   Discharge Instructions    Diet - low sodium heart healthy    Complete by:  As directed    Increase activity slowly    Complete by:  As directed      Discharge Medication List as of 09/12/2016 11:19 AM    START taking these medications   Details  carvedilol (COREG) 12.5 MG tablet Take 1 tablet (12.5 mg total) by mouth 2 (two) times daily with a meal., Starting Tue 09/12/2016, Print    zolpidem (AMBIEN) 5 MG tablet Take 1 tablet (5 mg total) by mouth at bedtime as needed for sleep., Starting Tue 09/12/2016, Print      CONTINUE these medications which have NOT CHANGED   Details  allopurinol (ZYLOPRIM) 100 MG tablet Take 1 tablet by mouth daily., Starting Thu 06/22/2016, Historical Med    rivaroxaban (XARELTO) 20 MG TABS tablet Take 1 tablet by mouth once daily with supper, Normal      STOP taking these medications     Omega-3 Fatty Acids (FISH OIL PO)        Allergies  Allergen Reactions  . Diltiazem Hcl     REACTION: Looked like burned all over  . Lisinopril     REACTION: Looked like burned all over  . Metoprolol Succinate     REACTION: Looked like burned all over  . Propafenone Hcl     REACTION: Looked like burned all over   Follow-up Information    Jovita Kussmaul, MD. Go on 09/21/2016.   Specialty:  General Surgery Why:  Your appointment is 09/21/2016 at [place]. Please arrive 30 minutes prior to your appointment to check in and fill out necessary paperwork. Contact information: [address] [place] [address] [phone]        [facility] Follow up.   Why:  HH nursing,physical  therapy,aide Contact information: [address] [place] [address] [phone]        [name], MD Follow up on 09/22/2016.   Specialty:  Cardiology Contact information: [place] [address] [phone]            The results of significant diagnostics from this hospitalization (including imaging, microbiology, ancillary and laboratory) are listed below for reference.    Significant Diagnostic Studies: Ct Abdomen Pelvis Wo Contrast  Result Date: 08/27/2016 CLINICAL DATA:  Preop right colectomy.  GI bleed. EXAM: CT ABDOMEN AND PELVIS WITHOUT CONTRAST TECHNIQUE: Multidetector CT imaging of the abdomen and pelvis was performed following the standard protocol without IV contrast. COMPARISON:  08/21/2016 FINDINGS: Lower chest: No acute findings. Hepatobiliary: No focal hepatic abnormality. Gallbladder unremarkable. Pancreas: No focal abnormality or ductal dilatation. Spleen: No focal abnormality.  Normal size. Adrenals/Urinary Tract: Adrenal glands unremarkable. Bilateral renal parapelvic cysts and cortical thinning. No hydronephrosis. Urinary bladder unremarkable. Stomach/Bowel: Appendix is normal. Scattered sigmoid diverticula. No active diverticulitis. Stomach and small bowel decompressed,  unremarkable. Vascular/Lymphatic: Aortic and iliac calcifications. No aneurysm or adenopathy. Reproductive: Prostate enlargement. Other: No free fluid or free air. Musculoskeletal: No acute bony abnormality. IMPRESSION: Sigmoid diverticulosis.  No active diverticulitis. Aortoiliac atherosclerosis. Bilateral renal parapelvic cysts. No acute findings. Electronically Signed   By: Rolm Baptise M.D.   On: 08/27/2016 10:30   Dg Chest 2 View  Result Date: 09/05/2016 CLINICAL DATA:  Shortness of Breath EXAM: CHEST  2 VIEW COMPARISON:  September 03, 2016 FINDINGS: Central catheter tip is in the superior vena cava. No pneumothorax. There is slight bibasilar atelectasis. No  edema or consolidation. Heart size and pulmonary vascularity are normal. No adenopathy. There is aortic atherosclerosis. No evident bone lesions. There is degenerative change in the thoracic spine. IMPRESSION: Mild bibasilar atelectasis. No edema or consolidation. Stable cardiac silhouette. There is aortic atherosclerosis. Central catheter tip in superior vena cava. No pneumothorax. Aortic Atherosclerosis (ICD10-I70.0). Electronically Signed   By: Lowella Grip III M.D.   On: 09/05/2016 08:57   Dg Chest 2 View  Result Date: 09/01/2016 CLINICAL DATA:  Images obtained for PNA  pneumonia EXAM: CHEST - 2 VIEW COMPARISON:  08/27/2016 FINDINGS: Relatively low lung volumes. Some patchy subsegmental atelectasis versus early interstitial infiltrate at the left lung base and in the right infrahilar region. No confluent airspace consolidation. Heart size and mediastinal contours are within normal limits. Atheromatous aorta. No effusion.  No pneumothorax. Anterior vertebral endplate spurring at multiple levels in the mid and lower thoracic spine. IMPRESSION: 1. Low volumes with some patchy subsegmental atelectasis versus early interstitial infiltrate at the left base and right infrahilar region. Electronically Signed   By: Lucrezia Europe M.D.   On: 09/01/2016 08:41   Dg Chest 2 View  Result Date: 08/27/2016 CLINICAL DATA:  Preop obstructive sleep apnea EXAM: CHEST  2 VIEW COMPARISON:  10/11/2010 FINDINGS: Heart and mediastinal contours are within normal limits. No focal opacities or effusions. No acute bony abnormality. IMPRESSION: No active cardiopulmonary disease. Electronically Signed   By: Rolm Baptise M.D.   On: 08/27/2016 11:01   Ct Abdomen Pelvis W Contrast  Result Date: 09/04/2016 CLINICAL DATA:  83 y/o M; status post laparoscopic assisted right colectomy 08/28/2016. Leukocytosis. Evaluate for postoperative complications. EXAM: CT ABDOMEN AND PELVIS WITH CONTRAST TECHNIQUE: Multidetector CT imaging of the  abdomen and pelvis was performed using the standard protocol following bolus administration of intravenous contrast. CONTRAST:  38mL ISOVUE-300 IOPAMIDOL (ISOVUE-300) INJECTION 61%, 146mL ISOVUE-300 IOPAMIDOL (ISOVUE-300) INJECTION 61% COMPARISON:  08/27/2016 CT of the abdomen and pelvis. FINDINGS: Lower chest: Minor atelectasis in the lingula and bilateral lower lobes. Moderate to severe coronary artery calcification. Mild cardiomegaly. Hepatobiliary: No focal liver abnormality is seen. No gallstones, gallbladder wall thickening, or biliary dilatation. Pancreas: Unremarkable. No pancreatic ductal dilatation or surrounding inflammatory changes. Spleen: Normal in size without focal abnormality. Adrenals/Urinary Tract: Normal adrenal glands. Numerous parapelvic cysts of the kidneys bilaterally. No hydronephrosis. Normal bladder. Stomach/Bowel: Right partial colectomy. The sigmoid colon and rectum are diffusely featureless with mild wall thickening. Moderate proximal sigmoid diverticulosis. Patent enterocolic anastomosis without evidence for bowel obstruction. Oral contrast extends to the rectum. Peritoneal fluid collections: 1. Anterior and superior to the anastomosis measuring 44 x 21 x 38 mm (AP x ML x CC ), series 2, image 45. 2. Posterior to the anastomosis measuring 34 x 15 x 46 mm, series 2, image 49. 3. Pelvis posterior to the bladder measuring 38 x 27 x 41 mm, series 5, image 73. There is faint enhancement at  the margin of the collection posterior to the anastomosis but no definite organization into anabscess. Vascular/Lymphatic: Aortic atherosclerosis. No enlarged abdominal or pelvic lymph nodes. Reproductive: Mild prostate enlargement. Other: Postsurgical changes in the midline anterior abdominal wall without fluid collection. Musculoskeletal: No fracture is seen. IMPRESSION: 1. Small fluid collections are present surrounding the right lower quadrant enterocolic anastomosis and within the pelvis. No discrete  abscess. 2. Distal sigmoid colon and rectum are featureless with wall thickening compatible with colitis. Electronically Signed   By: Kristine Garbe M.D.   On: 09/04/2016 20:43   Dg Chest Port 1 View  Result Date: 09/02/2016 CLINICAL DATA:  PICC line placement EXAM: PORTABLE CHEST 1 VIEW COMPARISON:  09/01/2016 FINDINGS: 1643 hours. Low volume film with basilar atelectasis. No overt edema. The cardio pericardial silhouette is enlarged. Right PICC line is new in the interval with the tip positioned over the mid SVC level. Telemetry leads overlie the chest. IMPRESSION: Right PICC line tip overlies the mid SVC level. Electronically Signed   By: Misty Stanley M.D.   On: 09/02/2016 17:20   Dg Abd 2 Views  Result Date: 09/01/2016 CLINICAL DATA:  Ileus following gastrointestinal surgery EXAM: ABDOMEN - 2 VIEW COMPARISON:  CT abdomen and pelvis 08/27/2016 FINDINGS: Gas within stomach. Numerous air-filled loops of small bowel throughout abdomen. Scattered colonic gas. Findings are most consistent with a postoperative ileus. No definite bowel wall thickening or free air. Degenerative changes lumbar spine. Diffuse osseous demineralization. Skin clips at midline in mid abdomen. IMPRESSION: Postoperative ileus. Electronically Signed   By: Lavonia Dana M.D.   On: 09/01/2016 15:47   Dg Abd Acute W/chest  Result Date: 09/03/2016 CLINICAL DATA:  Increased white blood cell count. EXAM: DG ABDOMEN ACUTE W/ 1V CHEST COMPARISON:  09/02/2016 chest x-ray.  Abdomen study from 09/01/2016. FINDINGS: Low lung volumes without edema or focal airspace consolidation. Atelectasis noted at the lung bases. Cardiopericardial silhouette is at upper limits of normal for size. Right PICC line tip overlies the mid to distal SVC level. The visualized bony structures of the thorax are intact. Large gastric bubble noted. Diffuse gaseous small bowel distension persists and is similar to prior. Small bowel loops in the central abdomen  measure up to 5 cm diameter which is not substantially changed. Stool and gas are noted in the right colon. IMPRESSION: 1. Low lung volumes with basilar atelectasis. 2. Prominent gastric bubble with diffuse gaseous small bowel dilatation, similar to the study from 09/01/2016. Postop day 6 for resection of colon mass suggests findings are related to postoperative adynamic ileus. Electronically Signed   By: Misty Stanley M.D.   On: 09/03/2016 09:00    Microbiology: Recent Results (from the past 240 hour(s))  Culture, Urine     Status: Abnormal   Collection Time: 09/06/16 11:16 AM  Result Value Ref Range Status   Specimen Description URINE, CLEAN CATCH  Final   Special Requests NONE  Final   Culture MULTIPLE ORGANISMS PRESENT, NONE PREDOMINANT (A)  Final   Report Status 09/07/2016 FINAL  Final     Labs: Basic Metabolic Panel:  Recent Labs Lab 09/08/16 0430 09/09/16 0731 09/10/16 0444 09/11/16 0428 09/12/16 0439  NA 135 138 138 139 138  K 3.9 3.5 3.6 3.5 3.3*  CL 104 106 108 108 106  CO2 24 23 23 23 24   GLUCOSE 143* 100* 110* 112* 108*  BUN 20 23* 21* 20 18  CREATININE 1.44* 1.51* 1.47* 1.40* 1.40*  CALCIUM 8.3* 8.0* 7.7* 7.9* 8.0*  Liver Function Tests:  Recent Labs Lab 09/07/16 0833 09/08/16 0430  AST 41 31  ALT 66* 61  ALKPHOS 102 92  BILITOT 0.8 0.7  PROT 6.3* 5.6*  ALBUMIN 2.7* 2.4*   No results for input(s): LIPASE, AMYLASE in the last 168 hours. No results for input(s): AMMONIA in the last 168 hours. CBC:  Recent Labs Lab 09/07/16 0833 09/08/16 0430 09/09/16 0731 09/10/16 0444 09/11/16 0428 09/12/16 0439  WBC 38.0* 43.0* 40.9* 24.1* 18.9* 16.1*  NEUTROABS 34.3* 39.5*  --  18.1*  --   --   HGB 10.6* 9.5* 9.1* 9.4* 8.7* 8.9*  HCT 31.8* 28.6* 27.9* 28.7* 26.5* 28.0*  MCV 88.1 86.1 89.4 87.8 88.3 86.4  PLT 412* 378 413* 441* 400 410*   Cardiac Enzymes: No results for input(s): CKTOTAL, CKMB, CKMBINDEX, TROPONINI in the last 168 hours. BNP: BNP (last  3 results) No results for input(s): BNP in the last 8760 hours.  ProBNP (last 3 results) No results for input(s): PROBNP in the last 8760 hours.  CBG: No results for input(s): GLUCAP in the last 168 hours.     SignedDomenic Polite MD.  [facility] 09/12/2016, 2:51 PM

## 2016-09-12 NOTE — Progress Notes (Signed)
PT Cancellation Note  Patient Details Name: Bradley Short MRN: [id number] DOB: 18-Jan-1933   Cancelled Treatment:     PT deferred this am at pt request, prefers to save energy for return home - dc this date.   [name] 09/12/2016, 9:41 AM

## 2016-09-12 NOTE — Progress Notes (Signed)
Nutrition Follow-up  DOCUMENTATION CODES:   Not applicable  INTERVENTION:  - Will order Boost Breeze once/day, this supplement provides 250 kcal and 9 grams of protein - Continue to encourage PO intakes of meals and supplement. - RD will continue to monitor for needs.  NUTRITION DIAGNOSIS:   Inadequate oral intake related to poor appetite as evidenced by per patient/family report. -improving.  GOAL:   Patient will meet greater than or equal to 90% of their needs -variably met minimally.   MONITOR:   PO intake, Supplement acceptance, Weight trends, Labs  ASSESSMENT:   83 year old male with PMH of PAF on Xarelto, HLD, OSA on CPAP, gout, presented with multiple episodes of painless BRBPR, weakness, diaphoresis, went to [facility] ED where hemoglobin around 12 g, initially A. fib with RVR which improved with IV fluids, CT abdomen and pelvis showed sigmoid diverticulosis without acute findings, transferred to [facility] stepdown unit. Hemodynamically and hemoglobin stable. [facility] consulted and patient underwent colonoscopy 6/28. Cecal lesion noted-Gen. surgery consulted and plan possible surgery 7/2. Cardiology provided preop clearance and made recommendations regarding A. fib management that patient developed overnight prior to surgery.  7/17 Current weight consistent with admission weight. Pt continues on Soft diet and has been eating 50-75% at meals during the past few days; appetite improving and pt tolerating diet without issue. He did not like thickness of Ensure despite dilution. Will trial Boost Breeze once/day; pt had this supplement while on CLD. TPN off since time of previous assessment. Per MD note yesterday, pt to d/c today if stable.   Medications reviewed; 20 mg oral Pepcid BID.  Labs reviewed; K: 3.3 mmol/L, creatinine: 1.4 mg/dL, Ca: 8 mg/dL, GFR: 45 mL/min.    7/10 - Patient in room with wife at bedside.  - Pt reports not feeling hungry at  this time but his appetite has improved in general.  - Pt receiving TPN: Clinimix 5/20 @ 83 ml/hr w/ ILE @ 20 ml/hr x 12 hours.  - Plan will be for TPN to be decreased to 1/2 rate and ultimately discontinued.  - Pt consumed a cheese omelet with grits last night.  - Pt's wife states it was the first full meals he has had in 2 weeks.  - States he tried to eat another omelet this morning but had many distractions and his food got cold.  - Pt's wife with questions regarding diet at home.  - Provided handout on soft diet. Reviewed acceptable foods to try.  - Encouraged protein supplements at home.  - Pt states Ensure is too thick and rich. Suggested diluting with milk. Pt is open to trying supplements again.    Diet Order:  DIET SOFT Room service appropriate? Yes; Fluid consistency: Thin Diet - low sodium heart healthy  Skin:   (7/2 abdominal incision)  Last BM:  7/15  Height:   Ht Readings from Last 1 Encounters:  08/28/16 _0  (1.803 m)    Weight:   Wt Readings from Last 1 Encounters:  09/12/16 206 lb 9.1 oz (93.7 kg)    Ideal Body Weight:  78.2 kg  BMI:  Body mass index is 28.81 kg/m.  Estimated Nutritional Needs:   Kcal:  2100-2300  Protein:  110-120g  Fluid:  2.1L/day  EDUCATION NEEDS:   No education needs identified at this time    Jarome Matin, MS, RD, LDN, CNSC Inpatient Clinical Dietitian Pager # [phone] After hours/weekend pager # [phone]

## 2016-09-12 NOTE — Progress Notes (Signed)
Reviewed discharge information with patient and caregiver. Answered all questions. Patient/caregiver able to teach back medications and reasons to contact MD/911. Patient verbalizes importance of PCP follow up appointment.  Pricila Bridge M. Ellenora Talton, RN  

## 2016-09-13 DIAGNOSIS — K5732 Diverticulitis of large intestine without perforation or abscess without bleeding: Secondary | ICD-10-CM | POA: Diagnosis not present

## 2016-09-13 DIAGNOSIS — M109 Gout, unspecified: Secondary | ICD-10-CM | POA: Diagnosis not present

## 2016-09-13 DIAGNOSIS — Z483 Aftercare following surgery for neoplasm: Secondary | ICD-10-CM | POA: Diagnosis not present

## 2016-09-13 DIAGNOSIS — C180 Malignant neoplasm of cecum: Secondary | ICD-10-CM | POA: Diagnosis not present

## 2016-09-13 DIAGNOSIS — G4733 Obstructive sleep apnea (adult) (pediatric): Secondary | ICD-10-CM | POA: Diagnosis not present

## 2016-09-13 DIAGNOSIS — I480 Paroxysmal atrial fibrillation: Secondary | ICD-10-CM | POA: Diagnosis not present

## 2016-09-13 DIAGNOSIS — K922 Gastrointestinal hemorrhage, unspecified: Secondary | ICD-10-CM | POA: Diagnosis not present

## 2016-09-13 DIAGNOSIS — F319 Bipolar disorder, unspecified: Secondary | ICD-10-CM | POA: Diagnosis not present

## 2016-09-13 DIAGNOSIS — D6489 Other specified anemias: Secondary | ICD-10-CM | POA: Diagnosis not present

## 2016-09-15 ENCOUNTER — Other Ambulatory Visit: Payer: Self-pay

## 2016-09-15 DIAGNOSIS — K922 Gastrointestinal hemorrhage, unspecified: Secondary | ICD-10-CM | POA: Diagnosis not present

## 2016-09-15 DIAGNOSIS — K5732 Diverticulitis of large intestine without perforation or abscess without bleeding: Secondary | ICD-10-CM | POA: Diagnosis not present

## 2016-09-15 DIAGNOSIS — D6489 Other specified anemias: Secondary | ICD-10-CM | POA: Diagnosis not present

## 2016-09-15 DIAGNOSIS — C180 Malignant neoplasm of cecum: Secondary | ICD-10-CM | POA: Diagnosis not present

## 2016-09-15 DIAGNOSIS — Z483 Aftercare following surgery for neoplasm: Secondary | ICD-10-CM | POA: Diagnosis not present

## 2016-09-15 DIAGNOSIS — I480 Paroxysmal atrial fibrillation: Secondary | ICD-10-CM | POA: Diagnosis not present

## 2016-09-18 DIAGNOSIS — K922 Gastrointestinal hemorrhage, unspecified: Secondary | ICD-10-CM | POA: Diagnosis not present

## 2016-09-18 DIAGNOSIS — D6489 Other specified anemias: Secondary | ICD-10-CM | POA: Diagnosis not present

## 2016-09-18 DIAGNOSIS — C180 Malignant neoplasm of cecum: Secondary | ICD-10-CM | POA: Diagnosis not present

## 2016-09-18 DIAGNOSIS — I480 Paroxysmal atrial fibrillation: Secondary | ICD-10-CM | POA: Diagnosis not present

## 2016-09-18 DIAGNOSIS — Z483 Aftercare following surgery for neoplasm: Secondary | ICD-10-CM | POA: Diagnosis not present

## 2016-09-18 DIAGNOSIS — K5732 Diverticulitis of large intestine without perforation or abscess without bleeding: Secondary | ICD-10-CM | POA: Diagnosis not present

## 2016-09-19 DIAGNOSIS — K5732 Diverticulitis of large intestine without perforation or abscess without bleeding: Secondary | ICD-10-CM | POA: Diagnosis not present

## 2016-09-19 DIAGNOSIS — Z483 Aftercare following surgery for neoplasm: Secondary | ICD-10-CM | POA: Diagnosis not present

## 2016-09-19 DIAGNOSIS — I480 Paroxysmal atrial fibrillation: Secondary | ICD-10-CM | POA: Diagnosis not present

## 2016-09-19 DIAGNOSIS — D6489 Other specified anemias: Secondary | ICD-10-CM | POA: Diagnosis not present

## 2016-09-19 DIAGNOSIS — K922 Gastrointestinal hemorrhage, unspecified: Secondary | ICD-10-CM | POA: Diagnosis not present

## 2016-09-19 DIAGNOSIS — C180 Malignant neoplasm of cecum: Secondary | ICD-10-CM | POA: Diagnosis not present

## 2016-09-20 DIAGNOSIS — K5732 Diverticulitis of large intestine without perforation or abscess without bleeding: Secondary | ICD-10-CM | POA: Diagnosis not present

## 2016-09-20 DIAGNOSIS — I480 Paroxysmal atrial fibrillation: Secondary | ICD-10-CM | POA: Diagnosis not present

## 2016-09-20 DIAGNOSIS — C180 Malignant neoplasm of cecum: Secondary | ICD-10-CM | POA: Diagnosis not present

## 2016-09-20 DIAGNOSIS — K922 Gastrointestinal hemorrhage, unspecified: Secondary | ICD-10-CM | POA: Diagnosis not present

## 2016-09-20 DIAGNOSIS — Z483 Aftercare following surgery for neoplasm: Secondary | ICD-10-CM | POA: Diagnosis not present

## 2016-09-20 DIAGNOSIS — D6489 Other specified anemias: Secondary | ICD-10-CM | POA: Diagnosis not present

## 2016-09-22 ENCOUNTER — Encounter: Payer: Self-pay | Admitting: Internal Medicine

## 2016-09-22 ENCOUNTER — Ambulatory Visit (INDEPENDENT_AMBULATORY_CARE_PROVIDER_SITE_OTHER): Payer: Medicare Other | Admitting: Internal Medicine

## 2016-09-22 VITALS — BP 100/48 | HR 57 | Ht 70.0 in | Wt 201.0 lb

## 2016-09-22 DIAGNOSIS — Z483 Aftercare following surgery for neoplasm: Secondary | ICD-10-CM | POA: Diagnosis not present

## 2016-09-22 DIAGNOSIS — I10 Essential (primary) hypertension: Secondary | ICD-10-CM | POA: Diagnosis not present

## 2016-09-22 DIAGNOSIS — C180 Malignant neoplasm of cecum: Secondary | ICD-10-CM | POA: Diagnosis not present

## 2016-09-22 DIAGNOSIS — I480 Paroxysmal atrial fibrillation: Secondary | ICD-10-CM | POA: Diagnosis not present

## 2016-09-22 DIAGNOSIS — K5732 Diverticulitis of large intestine without perforation or abscess without bleeding: Secondary | ICD-10-CM | POA: Diagnosis not present

## 2016-09-22 DIAGNOSIS — D6489 Other specified anemias: Secondary | ICD-10-CM | POA: Diagnosis not present

## 2016-09-22 DIAGNOSIS — K922 Gastrointestinal hemorrhage, unspecified: Secondary | ICD-10-CM | POA: Diagnosis not present

## 2016-09-22 MED ORDER — CARVEDILOL 6.25 MG PO TABS: 6.2500 mg | ORAL_TABLET | Freq: Two times a day (BID) | ORAL | 3 refills | Status: DC

## 2016-09-22 NOTE — Patient Instructions (Addendum)
Medication Instructions:   Your physician has recommended you make the following change in your medication:   Decrease your carvedilol to 6.25 mg by mouth twice daily. You may break your 12.5 mg tablets in half twice daily until they are finished.  Continue all other medications the same.  Labwork:  NONE  Testing/Procedures:  NONE  Follow-Up:  Your physician recommends that you schedule a follow-up appointment in: September 2018  Any Other Special Instructions Will Be Listed Below (If Applicable).  If you need a refill on your cardiac medications before your next appointment, please call your pharmacy.

## 2016-09-22 NOTE — Progress Notes (Signed)
PCP: Manon Hilding, MD Primary Cardiologist: Dr Beatriz Chancellor is a 83 y.o. male who presents today for electrophysiology followup.  He recently developed GI bleed.  He had colon mass and required surgery.  He had perioperative atrial fibrillation.  He was placed on amiodarone transiently.  This was stopped after he developed a rash.  Though the rash was felt to possibly be due to amiodarone, he has tolerated this medicine previously without issues.  he has had no further bleeding.  He is slowly returning to baseline.  Denies any further afib.   Today, he denies symptoms of palpitations, chest pain, shortness of breath,  lower extremity edema, dizziness, presyncope, or syncope.  The patient is otherwise without complaint today.   Past Medical History:  Diagnosis Date  . Atrial fibrillation (HCC)    paroxysmal, s/p PVI by Dr Rayann Heman 8/12  . Atrial flutter ([place])    s/p CTI ablation  . Hyperlipidemia   . Obstructive sleep apnea (adult) (pediatric)    on CPAP   Past Surgical History:  Procedure Laterality Date  . ATRIAL ABLATION SURGERY     s/p CTI and PVI ablations by Dr Rayann Heman 8/12  . CARDIAC CATHETERIZATION     nonobstructive CAD 2000  . COLONOSCOPY WITH PROPOFOL N/A 08/24/2016   Procedure: COLONOSCOPY WITH PROPOFOL;  Surgeon: Teena Irani, MD;  Location: [facility];  Service: Endoscopy;  Laterality: N/A;  . LAPAROSCOPIC RIGHT COLECTOMY Right 08/28/2016   Procedure: LAPAROSCOPIC ASSISTED RIGHT COLECTOMY;  Surgeon: Autumn Messing III, MD;  Location: [facility];  Service: General;  Laterality: Right;  . melanoma (other)     resected from his right shoulder    ROS- all systems are reviewed and negatives except as per HPI above  Current Outpatient Prescriptions  Medication Sig Dispense Refill  . allopurinol (ZYLOPRIM) 100 MG tablet Take 1 tablet by mouth daily.    . carvedilol (COREG) 12.5 MG tablet Take 1 tablet (12.5 mg total) by mouth 2 (two) times daily with a meal. 60 tablet 0    . clonazePAM (KLONOPIN) 0.5 MG tablet Take 0.5 mg by mouth 3 (three) times daily as needed for anxiety.    . rivaroxaban (XARELTO) 20 MG TABS tablet Take 1 tablet by mouth once daily with supper 90 tablet 1   No current facility-administered medications for this visit.     Physical Exam: Vitals:   09/22/16 0936  BP: (!) 100/48  Pulse: (!) 57  SpO2: 97%  Weight: 201 lb (91.2 kg)  Height: 5\' 10"  (1.778 m)    GEN- The patient is frail and weak appearing, alert and oriented x 3 today.   Head- normocephalic, atraumatic Eyes-  Sclera clear, conjunctiva pink Ears- hearing intact Oropharynx- clear Lungs- Clear to ausculation bilaterally, normal work of breathing Heart- Regular rate and rhythm, no murmurs, rubs or gallops, PMI not laterally displaced GI- soft, NT, ND, + BS Extremities- no clubbing, cyanosis, or edema  EKG tracing ordered today is personally reviewed and shows sinus rhythm 59 bpm, PR 278 msec, nonspecific ST/T changes  Assessment and Plan:  1. Persistent afib He has done great post ablation, without afib He more recently did have perioperative afib in the setting of GI bleeding and colonic mass He has had no further afib since his discharge off of AAD therapy Wean coreg to 6.25mg  BID today Consider stopping coreg in the future If he has no further clinical symptoms of afib, we could consider ILR placement down the road  to help determine whether or not he stays on long term anticoagualtiono  chads2vasc score is 75 (age, htn)  2. HTN BP is low Reduce coreg today Consider stopping coreg upon return  3. Gout I have advised that he avoid NSAIDs May need colchicine for acute flairs  Follow-up with Dr Quintin Alto next week as scheduled Return to see me in 4-6 weeks   [name] MD, [facility] 09/22/2016 10:12 AM

## 2016-09-25 DIAGNOSIS — K5732 Diverticulitis of large intestine without perforation or abscess without bleeding: Secondary | ICD-10-CM | POA: Diagnosis not present

## 2016-09-25 DIAGNOSIS — I480 Paroxysmal atrial fibrillation: Secondary | ICD-10-CM | POA: Diagnosis not present

## 2016-09-25 DIAGNOSIS — Z483 Aftercare following surgery for neoplasm: Secondary | ICD-10-CM | POA: Diagnosis not present

## 2016-09-25 DIAGNOSIS — D6489 Other specified anemias: Secondary | ICD-10-CM | POA: Diagnosis not present

## 2016-09-25 DIAGNOSIS — C180 Malignant neoplasm of cecum: Secondary | ICD-10-CM | POA: Diagnosis not present

## 2016-09-25 DIAGNOSIS — K922 Gastrointestinal hemorrhage, unspecified: Secondary | ICD-10-CM | POA: Diagnosis not present

## 2016-09-26 DIAGNOSIS — K625 Hemorrhage of anus and rectum: Secondary | ICD-10-CM | POA: Diagnosis not present

## 2016-09-26 DIAGNOSIS — F411 Generalized anxiety disorder: Secondary | ICD-10-CM | POA: Diagnosis not present

## 2016-09-26 DIAGNOSIS — Z6827 Body mass index (BMI) 27.0-27.9, adult: Secondary | ICD-10-CM | POA: Diagnosis not present

## 2016-09-26 DIAGNOSIS — N183 Chronic kidney disease, stage 3 (moderate): Secondary | ICD-10-CM | POA: Diagnosis not present

## 2016-09-26 DIAGNOSIS — I482 Chronic atrial fibrillation: Secondary | ICD-10-CM | POA: Diagnosis not present

## 2016-09-27 DIAGNOSIS — I480 Paroxysmal atrial fibrillation: Secondary | ICD-10-CM | POA: Diagnosis not present

## 2016-09-27 DIAGNOSIS — K922 Gastrointestinal hemorrhage, unspecified: Secondary | ICD-10-CM | POA: Diagnosis not present

## 2016-09-27 DIAGNOSIS — D6489 Other specified anemias: Secondary | ICD-10-CM | POA: Diagnosis not present

## 2016-09-27 DIAGNOSIS — C180 Malignant neoplasm of cecum: Secondary | ICD-10-CM | POA: Diagnosis not present

## 2016-09-27 DIAGNOSIS — K5732 Diverticulitis of large intestine without perforation or abscess without bleeding: Secondary | ICD-10-CM | POA: Diagnosis not present

## 2016-09-27 DIAGNOSIS — Z483 Aftercare following surgery for neoplasm: Secondary | ICD-10-CM | POA: Diagnosis not present

## 2016-09-28 DIAGNOSIS — K5732 Diverticulitis of large intestine without perforation or abscess without bleeding: Secondary | ICD-10-CM | POA: Diagnosis not present

## 2016-09-28 DIAGNOSIS — Z483 Aftercare following surgery for neoplasm: Secondary | ICD-10-CM | POA: Diagnosis not present

## 2016-09-28 DIAGNOSIS — C180 Malignant neoplasm of cecum: Secondary | ICD-10-CM | POA: Diagnosis not present

## 2016-09-28 DIAGNOSIS — I480 Paroxysmal atrial fibrillation: Secondary | ICD-10-CM | POA: Diagnosis not present

## 2016-09-28 DIAGNOSIS — K922 Gastrointestinal hemorrhage, unspecified: Secondary | ICD-10-CM | POA: Diagnosis not present

## 2016-09-28 DIAGNOSIS — D6489 Other specified anemias: Secondary | ICD-10-CM | POA: Diagnosis not present

## 2016-10-03 DIAGNOSIS — D6489 Other specified anemias: Secondary | ICD-10-CM | POA: Diagnosis not present

## 2016-10-03 DIAGNOSIS — K922 Gastrointestinal hemorrhage, unspecified: Secondary | ICD-10-CM | POA: Diagnosis not present

## 2016-10-03 DIAGNOSIS — I480 Paroxysmal atrial fibrillation: Secondary | ICD-10-CM | POA: Diagnosis not present

## 2016-10-03 DIAGNOSIS — K5732 Diverticulitis of large intestine without perforation or abscess without bleeding: Secondary | ICD-10-CM | POA: Diagnosis not present

## 2016-10-03 DIAGNOSIS — Z483 Aftercare following surgery for neoplasm: Secondary | ICD-10-CM | POA: Diagnosis not present

## 2016-10-03 DIAGNOSIS — C180 Malignant neoplasm of cecum: Secondary | ICD-10-CM | POA: Diagnosis not present

## 2016-10-06 DIAGNOSIS — K922 Gastrointestinal hemorrhage, unspecified: Secondary | ICD-10-CM | POA: Diagnosis not present

## 2016-10-06 DIAGNOSIS — C180 Malignant neoplasm of cecum: Secondary | ICD-10-CM | POA: Diagnosis not present

## 2016-10-06 DIAGNOSIS — K5732 Diverticulitis of large intestine without perforation or abscess without bleeding: Secondary | ICD-10-CM | POA: Diagnosis not present

## 2016-10-06 DIAGNOSIS — I480 Paroxysmal atrial fibrillation: Secondary | ICD-10-CM | POA: Diagnosis not present

## 2016-10-06 DIAGNOSIS — Z483 Aftercare following surgery for neoplasm: Secondary | ICD-10-CM | POA: Diagnosis not present

## 2016-10-06 DIAGNOSIS — D6489 Other specified anemias: Secondary | ICD-10-CM | POA: Diagnosis not present

## 2016-10-09 DIAGNOSIS — I480 Paroxysmal atrial fibrillation: Secondary | ICD-10-CM | POA: Diagnosis not present

## 2016-10-09 DIAGNOSIS — C180 Malignant neoplasm of cecum: Secondary | ICD-10-CM | POA: Diagnosis not present

## 2016-10-09 DIAGNOSIS — K922 Gastrointestinal hemorrhage, unspecified: Secondary | ICD-10-CM | POA: Diagnosis not present

## 2016-10-09 DIAGNOSIS — D6489 Other specified anemias: Secondary | ICD-10-CM | POA: Diagnosis not present

## 2016-10-09 DIAGNOSIS — K5732 Diverticulitis of large intestine without perforation or abscess without bleeding: Secondary | ICD-10-CM | POA: Diagnosis not present

## 2016-10-09 DIAGNOSIS — Z483 Aftercare following surgery for neoplasm: Secondary | ICD-10-CM | POA: Diagnosis not present

## 2016-10-10 DIAGNOSIS — I480 Paroxysmal atrial fibrillation: Secondary | ICD-10-CM | POA: Diagnosis not present

## 2016-10-10 DIAGNOSIS — Z483 Aftercare following surgery for neoplasm: Secondary | ICD-10-CM | POA: Diagnosis not present

## 2016-10-10 DIAGNOSIS — K922 Gastrointestinal hemorrhage, unspecified: Secondary | ICD-10-CM | POA: Diagnosis not present

## 2016-10-10 DIAGNOSIS — D6489 Other specified anemias: Secondary | ICD-10-CM | POA: Diagnosis not present

## 2016-10-10 DIAGNOSIS — K5732 Diverticulitis of large intestine without perforation or abscess without bleeding: Secondary | ICD-10-CM | POA: Diagnosis not present

## 2016-10-10 DIAGNOSIS — C180 Malignant neoplasm of cecum: Secondary | ICD-10-CM | POA: Diagnosis not present

## 2016-10-11 DIAGNOSIS — D6489 Other specified anemias: Secondary | ICD-10-CM | POA: Diagnosis not present

## 2016-10-11 DIAGNOSIS — I480 Paroxysmal atrial fibrillation: Secondary | ICD-10-CM | POA: Diagnosis not present

## 2016-10-11 DIAGNOSIS — K5732 Diverticulitis of large intestine without perforation or abscess without bleeding: Secondary | ICD-10-CM | POA: Diagnosis not present

## 2016-10-11 DIAGNOSIS — C180 Malignant neoplasm of cecum: Secondary | ICD-10-CM | POA: Diagnosis not present

## 2016-10-11 DIAGNOSIS — Z483 Aftercare following surgery for neoplasm: Secondary | ICD-10-CM | POA: Diagnosis not present

## 2016-10-11 DIAGNOSIS — K922 Gastrointestinal hemorrhage, unspecified: Secondary | ICD-10-CM | POA: Diagnosis not present

## 2016-10-12 DIAGNOSIS — N183 Chronic kidney disease, stage 3 (moderate): Secondary | ICD-10-CM | POA: Diagnosis not present

## 2016-10-12 DIAGNOSIS — K625 Hemorrhage of anus and rectum: Secondary | ICD-10-CM | POA: Diagnosis not present

## 2016-10-12 DIAGNOSIS — I482 Chronic atrial fibrillation: Secondary | ICD-10-CM | POA: Diagnosis not present

## 2016-10-12 DIAGNOSIS — Z6827 Body mass index (BMI) 27.0-27.9, adult: Secondary | ICD-10-CM | POA: Diagnosis not present

## 2016-10-12 DIAGNOSIS — F411 Generalized anxiety disorder: Secondary | ICD-10-CM | POA: Diagnosis not present

## 2016-10-17 DIAGNOSIS — I480 Paroxysmal atrial fibrillation: Secondary | ICD-10-CM | POA: Diagnosis not present

## 2016-10-17 DIAGNOSIS — D6489 Other specified anemias: Secondary | ICD-10-CM | POA: Diagnosis not present

## 2016-10-17 DIAGNOSIS — K922 Gastrointestinal hemorrhage, unspecified: Secondary | ICD-10-CM | POA: Diagnosis not present

## 2016-10-17 DIAGNOSIS — K5732 Diverticulitis of large intestine without perforation or abscess without bleeding: Secondary | ICD-10-CM | POA: Diagnosis not present

## 2016-10-17 DIAGNOSIS — Z483 Aftercare following surgery for neoplasm: Secondary | ICD-10-CM | POA: Diagnosis not present

## 2016-10-17 DIAGNOSIS — C180 Malignant neoplasm of cecum: Secondary | ICD-10-CM | POA: Diagnosis not present

## 2016-10-19 DIAGNOSIS — D6489 Other specified anemias: Secondary | ICD-10-CM | POA: Diagnosis not present

## 2016-10-19 DIAGNOSIS — K5732 Diverticulitis of large intestine without perforation or abscess without bleeding: Secondary | ICD-10-CM | POA: Diagnosis not present

## 2016-10-19 DIAGNOSIS — Z483 Aftercare following surgery for neoplasm: Secondary | ICD-10-CM | POA: Diagnosis not present

## 2016-10-19 DIAGNOSIS — I480 Paroxysmal atrial fibrillation: Secondary | ICD-10-CM | POA: Diagnosis not present

## 2016-10-19 DIAGNOSIS — K922 Gastrointestinal hemorrhage, unspecified: Secondary | ICD-10-CM | POA: Diagnosis not present

## 2016-10-19 DIAGNOSIS — C180 Malignant neoplasm of cecum: Secondary | ICD-10-CM | POA: Diagnosis not present

## 2016-10-26 DIAGNOSIS — I480 Paroxysmal atrial fibrillation: Secondary | ICD-10-CM | POA: Diagnosis not present

## 2016-10-26 DIAGNOSIS — K5732 Diverticulitis of large intestine without perforation or abscess without bleeding: Secondary | ICD-10-CM | POA: Diagnosis not present

## 2016-10-26 DIAGNOSIS — Z483 Aftercare following surgery for neoplasm: Secondary | ICD-10-CM | POA: Diagnosis not present

## 2016-10-26 DIAGNOSIS — D6489 Other specified anemias: Secondary | ICD-10-CM | POA: Diagnosis not present

## 2016-10-26 DIAGNOSIS — C180 Malignant neoplasm of cecum: Secondary | ICD-10-CM | POA: Diagnosis not present

## 2016-10-26 DIAGNOSIS — K922 Gastrointestinal hemorrhage, unspecified: Secondary | ICD-10-CM | POA: Diagnosis not present

## 2016-10-27 DIAGNOSIS — I480 Paroxysmal atrial fibrillation: Secondary | ICD-10-CM | POA: Diagnosis not present

## 2016-10-27 DIAGNOSIS — C180 Malignant neoplasm of cecum: Secondary | ICD-10-CM | POA: Diagnosis not present

## 2016-10-27 DIAGNOSIS — Z483 Aftercare following surgery for neoplasm: Secondary | ICD-10-CM | POA: Diagnosis not present

## 2016-10-27 DIAGNOSIS — D6489 Other specified anemias: Secondary | ICD-10-CM | POA: Diagnosis not present

## 2016-10-27 DIAGNOSIS — K922 Gastrointestinal hemorrhage, unspecified: Secondary | ICD-10-CM | POA: Diagnosis not present

## 2016-10-27 DIAGNOSIS — K5732 Diverticulitis of large intestine without perforation or abscess without bleeding: Secondary | ICD-10-CM | POA: Diagnosis not present

## 2016-10-30 DIAGNOSIS — Z483 Aftercare following surgery for neoplasm: Secondary | ICD-10-CM | POA: Diagnosis not present

## 2016-10-30 DIAGNOSIS — I480 Paroxysmal atrial fibrillation: Secondary | ICD-10-CM | POA: Diagnosis not present

## 2016-10-30 DIAGNOSIS — K922 Gastrointestinal hemorrhage, unspecified: Secondary | ICD-10-CM | POA: Diagnosis not present

## 2016-10-30 DIAGNOSIS — C180 Malignant neoplasm of cecum: Secondary | ICD-10-CM | POA: Diagnosis not present

## 2016-10-30 DIAGNOSIS — D6489 Other specified anemias: Secondary | ICD-10-CM | POA: Diagnosis not present

## 2016-10-30 DIAGNOSIS — K5732 Diverticulitis of large intestine without perforation or abscess without bleeding: Secondary | ICD-10-CM | POA: Diagnosis not present

## 2016-11-03 ENCOUNTER — Encounter: Payer: Self-pay | Admitting: Internal Medicine

## 2016-11-03 ENCOUNTER — Ambulatory Visit (INDEPENDENT_AMBULATORY_CARE_PROVIDER_SITE_OTHER): Payer: Medicare Other | Admitting: Internal Medicine

## 2016-11-03 VITALS — BP 110/52 | HR 54 | Ht 70.0 in | Wt 202.0 lb

## 2016-11-03 DIAGNOSIS — I480 Paroxysmal atrial fibrillation: Secondary | ICD-10-CM

## 2016-11-03 DIAGNOSIS — I10 Essential (primary) hypertension: Secondary | ICD-10-CM

## 2016-11-03 MED ORDER — CARVEDILOL 3.125 MG PO TABS: 3.1250 mg | ORAL_TABLET | Freq: Two times a day (BID) | ORAL | 0 refills | Status: DC

## 2016-11-03 NOTE — Patient Instructions (Addendum)
Medication Instructions:   Your physician has recommended you make the following change in your medication:   Decrease carvedilol to 3.125 mg by mouth twice daily for 4 weeks; then, STOP. You may break your 6.25 mg tablets in half twice daily until they are finished.  Continue all other medications the same.  Labwork:  NONE  Testing/Procedures:  NONE  Follow-Up: Your physician recommends that you schedule a follow-up appointment in: 4 months. Please schedule this appointment today before leaving the office.  Any Other Special Instructions Will Be Listed Below (If Applicable).  Please give our office a call if you decide to have the loop recorder implanted. Please call [phone] and we will forward this to Dr. Rayann Heman and his nurse to set up for you.  If you need a refill on your cardiac medications before your next appointment, please call your pharmacy.

## 2016-11-03 NOTE — Progress Notes (Signed)
   PCP: Manon Hilding, MD Primary Cardiologist: Dr Percival Spanish Primary EP: Dr Teressa Senter is a 83 y.o. male who presents today for routine electrophysiology followup.  Since last being seen in our clinic, the patient reports doing very well.  He continues to recover from his colon surgery and extensive hospital stay.  He has had no further symptoms of afib.  Today, he denies symptoms of palpitations, chest pain, shortness of breath,  lower extremity edema, dizziness, presyncope, or syncope.  The patient is otherwise without complaint today.   Past Medical History:  Diagnosis Date  . Atrial fibrillation (HCC)    paroxysmal, s/p PVI by Dr Rayann Heman 8/12  . Atrial flutter ([place])    s/p CTI ablation  . Hyperlipidemia   . Obstructive sleep apnea (adult) (pediatric)    on CPAP   Past Surgical History:  Procedure Laterality Date  . ATRIAL ABLATION SURGERY     s/p CTI and PVI ablations by Dr Rayann Heman 8/12  . CARDIAC CATHETERIZATION     nonobstructive CAD 2000  . COLONOSCOPY WITH PROPOFOL N/A 08/24/2016   Procedure: COLONOSCOPY WITH PROPOFOL;  Surgeon: Teena Irani, MD;  Location: [facility];  Service: Endoscopy;  Laterality: N/A;  . LAPAROSCOPIC RIGHT COLECTOMY Right 08/28/2016   Procedure: LAPAROSCOPIC ASSISTED RIGHT COLECTOMY;  Surgeon: Autumn Messing III, MD;  Location: [facility];  Service: General;  Laterality: Right;  . melanoma (other)     resected from his right shoulder    ROS- all systems are reviewed and negatives except as per HPI above  Current Outpatient Prescriptions  Medication Sig Dispense Refill  . allopurinol (ZYLOPRIM) 100 MG tablet Take 1 tablet by mouth daily.    . carvedilol (COREG) 6.25 MG tablet Take 1 tablet (6.25 mg total) by mouth 2 (two) times daily. 180 tablet 3  . clonazePAM (KLONOPIN) 0.5 MG tablet Take 0.5 mg by mouth 3 (three) times daily as needed for anxiety.    . colchicine 0.6 MG tablet Take 0.6 mg by mouth as needed.    . rivaroxaban (XARELTO) 20 MG TABS  tablet Take 1 tablet by mouth once daily with supper 90 tablet 1   No current facility-administered medications for this visit.     Physical Exam: Vitals:   11/03/16 1027  BP: (!) 110/52  Pulse: (!) 54  SpO2: 98%  Weight: 202 lb (91.6 kg)  Height: 5\' 10"  (1.778 m)    GEN- The patient is well appearing, alert and oriented x 3 today.   Head- normocephalic, atraumatic Eyes-  Sclera clear, conjunctiva pink Ears- hearing intact Oropharynx- clear Lungs- Clear to ausculation bilaterally, normal work of breathing Heart- Regular rate and rhythm, no murmurs, rubs or gallops, PMI not laterally displaced GI- soft, NT, ND, + BS Extremities- no clubbing, cyanosis, or edema   Assessment and Plan:  1. Persistent afib He has done great post ablation without afib until recent post operative afib This has resolved. Reduce coreg to 3.125mg  BId today,  Stop coreg in 4 weeks Today I have advised that he consider ILR implant to help determine whether or not he stays on anticoagulation long term.  He is thinking about this and will contact our office if he decides to proceed. chads2vasc score is 3 (age, htn)  2. HTN Stable No change required today Stop coreg   [name] MD, [facility] 11/03/2016 11:02 AM

## 2016-11-06 DIAGNOSIS — D6489 Other specified anemias: Secondary | ICD-10-CM | POA: Diagnosis not present

## 2016-11-06 DIAGNOSIS — K922 Gastrointestinal hemorrhage, unspecified: Secondary | ICD-10-CM | POA: Diagnosis not present

## 2016-11-06 DIAGNOSIS — K5732 Diverticulitis of large intestine without perforation or abscess without bleeding: Secondary | ICD-10-CM | POA: Diagnosis not present

## 2016-11-06 DIAGNOSIS — Z483 Aftercare following surgery for neoplasm: Secondary | ICD-10-CM | POA: Diagnosis not present

## 2016-11-06 DIAGNOSIS — I480 Paroxysmal atrial fibrillation: Secondary | ICD-10-CM | POA: Diagnosis not present

## 2016-11-06 DIAGNOSIS — C180 Malignant neoplasm of cecum: Secondary | ICD-10-CM | POA: Diagnosis not present

## 2016-11-08 DIAGNOSIS — D6489 Other specified anemias: Secondary | ICD-10-CM | POA: Diagnosis not present

## 2016-11-08 DIAGNOSIS — K922 Gastrointestinal hemorrhage, unspecified: Secondary | ICD-10-CM | POA: Diagnosis not present

## 2016-11-08 DIAGNOSIS — I480 Paroxysmal atrial fibrillation: Secondary | ICD-10-CM | POA: Diagnosis not present

## 2016-11-08 DIAGNOSIS — Z483 Aftercare following surgery for neoplasm: Secondary | ICD-10-CM | POA: Diagnosis not present

## 2016-11-08 DIAGNOSIS — C180 Malignant neoplasm of cecum: Secondary | ICD-10-CM | POA: Diagnosis not present

## 2016-11-08 DIAGNOSIS — K5732 Diverticulitis of large intestine without perforation or abscess without bleeding: Secondary | ICD-10-CM | POA: Diagnosis not present

## 2016-11-09 ENCOUNTER — Telehealth: Payer: Self-pay | Admitting: Internal Medicine

## 2016-11-09 NOTE — Telephone Encounter (Signed)
Patient walked in asking to go forward with Loop Recorder. Please contact his wife  Prentiss Bells to schedule

## 2016-11-09 NOTE — Telephone Encounter (Signed)
Spoke with patient's wife.  LINQ implant scheduled for 11/21/16 at 7:30am.  Patient to be at [facility] main Entrance of [facility] at 6:30am

## 2016-11-15 ENCOUNTER — Telehealth: Payer: Self-pay | Admitting: Internal Medicine

## 2016-11-15 NOTE — Telephone Encounter (Signed)
Left message for patient to call back  

## 2016-11-15 NOTE — Telephone Encounter (Signed)
Follow up ° ° ° ° ° °Returning a call to the nurse °

## 2016-11-15 NOTE — Telephone Encounter (Signed)
Patient returned call. Informed patient does not need to hold xarelto. Patient verbalized understanding.

## 2016-11-15 NOTE — Telephone Encounter (Signed)
New message     Pt c/o medication issue:  1. Name of Medication:  xarelto 2. How are you currently taking this medication (dosage and times per day)?  20mg  daily 3. Are you having a reaction (difficulty breathing--STAT)? no  4. What is your medication issue? Pt is due to have a loop recorder implant on 11-21-16.  Wife is calling to see if pt needs to hold his xarelto.  Please call

## 2016-11-15 NOTE — Telephone Encounter (Signed)
Patient does not have told xarelto for a loop recorder. Will inform patient when he returns call.

## 2016-11-21 ENCOUNTER — Encounter (HOSPITAL_COMMUNITY): Payer: Self-pay | Admitting: Internal Medicine

## 2016-11-21 ENCOUNTER — Ambulatory Visit (HOSPITAL_COMMUNITY)
Admission: RE | Admit: 2016-11-21 | Discharge: 2016-11-21 | Disposition: A | Discharge status: Home / Self Care | Payer: Medicare Other | Source: Ambulatory Visit | Attending: Internal Medicine | Admitting: Internal Medicine

## 2016-11-21 ENCOUNTER — Encounter (HOSPITAL_COMMUNITY)
Admission: RE | Disposition: A | Discharge status: Home / Self Care | Payer: Self-pay | Source: Ambulatory Visit | Attending: Internal Medicine

## 2016-11-21 DIAGNOSIS — Z7901 Long term (current) use of anticoagulants: Secondary | ICD-10-CM | POA: Insufficient documentation

## 2016-11-21 DIAGNOSIS — R002 Palpitations: Secondary | ICD-10-CM | POA: Diagnosis not present

## 2016-11-21 DIAGNOSIS — E785 Hyperlipidemia, unspecified: Secondary | ICD-10-CM | POA: Diagnosis not present

## 2016-11-21 DIAGNOSIS — I4891 Unspecified atrial fibrillation: Secondary | ICD-10-CM | POA: Diagnosis not present

## 2016-11-21 DIAGNOSIS — G4733 Obstructive sleep apnea (adult) (pediatric): Secondary | ICD-10-CM | POA: Insufficient documentation

## 2016-11-21 DIAGNOSIS — I2510 Atherosclerotic heart disease of native coronary artery without angina pectoris: Secondary | ICD-10-CM | POA: Diagnosis not present

## 2016-11-21 DIAGNOSIS — I481 Persistent atrial fibrillation: Secondary | ICD-10-CM | POA: Insufficient documentation

## 2016-11-21 DIAGNOSIS — I10 Essential (primary) hypertension: Secondary | ICD-10-CM | POA: Diagnosis not present

## 2016-11-21 HISTORY — PX: LOOP RECORDER INSERTION: EP1214

## 2016-11-21 SURGERY — LOOP RECORDER INSERTION

## 2016-11-21 MED ORDER — LIDOCAINE-EPINEPHRINE 1 %-1:100000 IJ SOLN
INTRAMUSCULAR | Status: DC | PRN
  Administered 2016-11-21: 30 mL

## 2016-11-21 MED ORDER — LIDOCAINE-EPINEPHRINE 1 %-1:100000 IJ SOLN
INTRAMUSCULAR | Status: AC
  Filled 2016-11-21: qty 1

## 2016-11-21 SURGICAL SUPPLY — 2 items
LOOP REVEAL LINQSYS (Prosthesis & Implant Heart) ×3 IMPLANT
PACK LOOP INSERTION (CUSTOM PROCEDURE TRAY) ×3 IMPLANT

## 2016-11-21 NOTE — Interval H&P Note (Signed)
History and Physical Interval Note:  11/21/2016 7:13 AM  Bradley Short  has presented today for surgery, with the diagnosis of afib  The various methods of treatment have been discussed with the patient and family. After consideration of risks, benefits and other options for treatment, the patient has consented to  Procedure(s): LOOP RECORDER INSERTION (N/A) as a surgical intervention .  The patient's history has been reviewed, patient examined, no change in status, stable for surgery.  I have reviewed the patient's chart and labs.  Questions were answered to the patient's satisfaction.     [name]

## 2016-11-21 NOTE — Progress Notes (Signed)
Pt and wife state understanding to instructions. Pt weak after prior surgery so we will wheel him to the car in a wheelchair. Dressing clean dry and intact.

## 2016-11-21 NOTE — H&P (View-Only) (Signed)
   PCP: Manon Hilding, MD Primary Cardiologist: Dr Percival Spanish Primary EP: Dr Teressa Senter is a 83 y.o. male who presents today for routine electrophysiology followup.  Since last being seen in our clinic, the patient reports doing very well.  He continues to recover from his colon surgery and extensive hospital stay.  He has had no further symptoms of afib.  Today, he denies symptoms of palpitations, chest pain, shortness of breath,  lower extremity edema, dizziness, presyncope, or syncope.  The patient is otherwise without complaint today.   Past Medical History:  Diagnosis Date  . Atrial fibrillation (HCC)    paroxysmal, s/p PVI by Dr Rayann Heman 8/12  . Atrial flutter ([place])    s/p CTI ablation  . Hyperlipidemia   . Obstructive sleep apnea (adult) (pediatric)    on CPAP   Past Surgical History:  Procedure Laterality Date  . ATRIAL ABLATION SURGERY     s/p CTI and PVI ablations by Dr Rayann Heman 8/12  . CARDIAC CATHETERIZATION     nonobstructive CAD 2000  . COLONOSCOPY WITH PROPOFOL N/A 08/24/2016   Procedure: COLONOSCOPY WITH PROPOFOL;  Surgeon: Teena Irani, MD;  Location: [facility];  Service: Endoscopy;  Laterality: N/A;  . LAPAROSCOPIC RIGHT COLECTOMY Right 08/28/2016   Procedure: LAPAROSCOPIC ASSISTED RIGHT COLECTOMY;  Surgeon: Autumn Messing III, MD;  Location: [facility];  Service: General;  Laterality: Right;  . melanoma (other)     resected from his right shoulder    ROS- all systems are reviewed and negatives except as per HPI above  Current Outpatient Prescriptions  Medication Sig Dispense Refill  . allopurinol (ZYLOPRIM) 100 MG tablet Take 1 tablet by mouth daily.    . carvedilol (COREG) 6.25 MG tablet Take 1 tablet (6.25 mg total) by mouth 2 (two) times daily. 180 tablet 3  . clonazePAM (KLONOPIN) 0.5 MG tablet Take 0.5 mg by mouth 3 (three) times daily as needed for anxiety.    . colchicine 0.6 MG tablet Take 0.6 mg by mouth as needed.    . rivaroxaban (XARELTO) 20 MG TABS  tablet Take 1 tablet by mouth once daily with supper 90 tablet 1   No current facility-administered medications for this visit.     Physical Exam: Vitals:   11/03/16 1027  BP: (!) 110/52  Pulse: (!) 54  SpO2: 98%  Weight: 202 lb (91.6 kg)  Height: 5\' 10"  (1.778 m)    GEN- The patient is well appearing, alert and oriented x 3 today.   Head- normocephalic, atraumatic Eyes-  Sclera clear, conjunctiva pink Ears- hearing intact Oropharynx- clear Lungs- Clear to ausculation bilaterally, normal work of breathing Heart- Regular rate and rhythm, no murmurs, rubs or gallops, PMI not laterally displaced GI- soft, NT, ND, + BS Extremities- no clubbing, cyanosis, or edema   Assessment and Plan:  1. Persistent afib He has done great post ablation without afib until recent post operative afib This has resolved. Reduce coreg to 3.125mg  BId today,  Stop coreg in 4 weeks Today I have advised that he consider ILR implant to help determine whether or not he stays on anticoagulation long term.  He is thinking about this and will contact our office if he decides to proceed. chads2vasc score is 3 (age, htn)  2. HTN Stable No change required today Stop coreg   [name] MD, [facility] 11/03/2016 11:02 AM

## 2016-12-04 ENCOUNTER — Ambulatory Visit (INDEPENDENT_AMBULATORY_CARE_PROVIDER_SITE_OTHER): Payer: Medicare Other | Admitting: *Deleted

## 2016-12-04 DIAGNOSIS — I480 Paroxysmal atrial fibrillation: Secondary | ICD-10-CM

## 2016-12-04 LAB — CUP PACEART INCLINIC DEVICE CHECK
Date Time Interrogation Session: 20181008110321
MDC IDC PG IMPLANT DT: 20180925

## 2016-12-04 NOTE — Patient Instructions (Addendum)
STOP your Xarelto per Dr. Jackalyn Lombard instructions.

## 2016-12-04 NOTE — Progress Notes (Signed)
Wound check appointment. Steri-strips removed. Wound without redness or edema. Incision edges approximated, wound well healed. Normal device function. Battery status: good. R-waves 0.93mV. No symptom, tachy, or AF episodes. Pause and brady detection off at implant per JA. Xarelto d/c today by JA. Patient educated about wound care and Carelink monitor. Monthly summary reports and ROV with JA/E on 03/02/17.

## 2016-12-13 DIAGNOSIS — K210 Gastro-esophageal reflux disease with esophagitis: Secondary | ICD-10-CM | POA: Diagnosis not present

## 2016-12-13 DIAGNOSIS — I482 Chronic atrial fibrillation: Secondary | ICD-10-CM | POA: Diagnosis not present

## 2016-12-13 DIAGNOSIS — N529 Male erectile dysfunction, unspecified: Secondary | ICD-10-CM | POA: Diagnosis not present

## 2016-12-13 DIAGNOSIS — F411 Generalized anxiety disorder: Secondary | ICD-10-CM | POA: Diagnosis not present

## 2016-12-13 DIAGNOSIS — N183 Chronic kidney disease, stage 3 (moderate): Secondary | ICD-10-CM | POA: Diagnosis not present

## 2016-12-13 DIAGNOSIS — E7800 Pure hypercholesterolemia, unspecified: Secondary | ICD-10-CM | POA: Diagnosis not present

## 2016-12-21 ENCOUNTER — Ambulatory Visit (INDEPENDENT_AMBULATORY_CARE_PROVIDER_SITE_OTHER): Payer: Medicare Other | Admitting: *Deleted

## 2016-12-21 DIAGNOSIS — M109 Gout, unspecified: Secondary | ICD-10-CM | POA: Diagnosis not present

## 2016-12-21 DIAGNOSIS — Z6829 Body mass index (BMI) 29.0-29.9, adult: Secondary | ICD-10-CM | POA: Diagnosis not present

## 2016-12-21 DIAGNOSIS — R7301 Impaired fasting glucose: Secondary | ICD-10-CM | POA: Diagnosis not present

## 2016-12-21 DIAGNOSIS — I482 Chronic atrial fibrillation: Secondary | ICD-10-CM | POA: Diagnosis not present

## 2016-12-21 DIAGNOSIS — K210 Gastro-esophageal reflux disease with esophagitis: Secondary | ICD-10-CM | POA: Diagnosis not present

## 2016-12-21 DIAGNOSIS — Z23 Encounter for immunization: Secondary | ICD-10-CM | POA: Diagnosis not present

## 2016-12-21 DIAGNOSIS — I480 Paroxysmal atrial fibrillation: Secondary | ICD-10-CM | POA: Diagnosis not present

## 2016-12-21 DIAGNOSIS — N183 Chronic kidney disease, stage 3 (moderate): Secondary | ICD-10-CM | POA: Diagnosis not present

## 2016-12-21 DIAGNOSIS — N529 Male erectile dysfunction, unspecified: Secondary | ICD-10-CM | POA: Diagnosis not present

## 2016-12-21 NOTE — Progress Notes (Signed)
Carelink Summary Report / Loop Recorder 

## 2016-12-25 ENCOUNTER — Telehealth: Payer: Self-pay | Admitting: *Deleted

## 2016-12-25 NOTE — Telephone Encounter (Signed)
Reviewed ECGs with Dr. Cathlean Cower ECGs show sinus rhythm w/PACs, not A-fib.  Will plan to continue to monitor for true A-fib.

## 2016-12-25 NOTE — Telephone Encounter (Signed)
Spoke with patient to request a manual Carelink transmission for review.  Assisted patient with sending transmission, successfully received.  Received Carelink alert for 3 "AF" episodes--available ECG appears SR w/PACs.  Will review additional ECGs with Dr. Rayann Heman.

## 2016-12-26 ENCOUNTER — Other Ambulatory Visit: Payer: Self-pay | Admitting: Internal Medicine

## 2016-12-26 LAB — CUP PACEART REMOTE DEVICE CHECK
Date Time Interrogation Session: 20181025114101
Implantable Pulse Generator Implant Date: 20180925

## 2017-01-05 DIAGNOSIS — Z6827 Body mass index (BMI) 27.0-27.9, adult: Secondary | ICD-10-CM | POA: Diagnosis not present

## 2017-01-05 DIAGNOSIS — K432 Incisional hernia without obstruction or gangrene: Secondary | ICD-10-CM | POA: Diagnosis not present

## 2017-01-22 ENCOUNTER — Ambulatory Visit (INDEPENDENT_AMBULATORY_CARE_PROVIDER_SITE_OTHER): Payer: Medicare Other | Admitting: *Deleted

## 2017-01-22 DIAGNOSIS — I480 Paroxysmal atrial fibrillation: Secondary | ICD-10-CM

## 2017-01-22 NOTE — Progress Notes (Signed)
Carelink Summary Report / Loop Recorder 

## 2017-01-24 ENCOUNTER — Ambulatory Visit: Payer: Self-pay | Admitting: General Surgery

## 2017-01-24 DIAGNOSIS — K439 Ventral hernia without obstruction or gangrene: Secondary | ICD-10-CM | POA: Diagnosis not present

## 2017-01-25 ENCOUNTER — Telehealth: Payer: Self-pay

## 2017-01-25 NOTE — Telephone Encounter (Signed)
   [facility] Pre-operative Risk Assessment    Request for surgical clearance:  1. What type of surgery is being performed? Hernia repair  2. When is this surgery scheduled? TBD  3. Are there any medications that need to be held prior to surgery and how long? None listed   4. Practice name and name of physician performing surgery?  1. [facility] 2. Dr. Autumn Messing  5. What is your office phone and fax number? 1. Phone [phone] 2. Fax [phone] ATTN: Carlene Coria   6. Anesthesia type (None, local, MAC, general) ?  General    ____________________________________________   (provider comments below)

## 2017-01-29 NOTE — Telephone Encounter (Signed)
   Primary Cardiologist: No primary care provider on file.  Chart reviewed as part of pre-operative protocol coverage. Patient was contacted 01/29/2017 in reference to pre-operative risk assessment for pending surgery as outlined below. He had post op afib after hemicolectomy 08/2016. No episode of afib since then. S/p  ILR placement 11/21/16.    Bradley Short was last seen on 11/03/16 by Dr. Rayann Heman.  Since that day, Bradley Short has done well. DASI is 6.61 METS.   Therefore, based on ACC/AHA guidelines, the patient would be at acceptable risk for the planned procedure without further cardiovascular testing.   I will route this recommendation to the requesting party via Epic fax function and remove from pre-op pool.  Please call with questions.  [place], [place] 01/29/2017, 2:05 PM

## 2017-02-06 LAB — CUP PACEART REMOTE DEVICE CHECK
Implantable Pulse Generator Implant Date: 20180925
MDC IDC SESS DTM: 20181124144126

## 2017-02-08 ENCOUNTER — Other Ambulatory Visit: Payer: Self-pay | Admitting: Internal Medicine

## 2017-02-08 DIAGNOSIS — K439 Ventral hernia without obstruction or gangrene: Secondary | ICD-10-CM | POA: Diagnosis not present

## 2017-02-09 ENCOUNTER — Ambulatory Visit (INDEPENDENT_AMBULATORY_CARE_PROVIDER_SITE_OTHER): Payer: Medicare Other | Admitting: Internal Medicine

## 2017-02-09 ENCOUNTER — Encounter: Payer: Self-pay | Admitting: Internal Medicine

## 2017-02-09 ENCOUNTER — Telehealth: Payer: Self-pay | Admitting: *Deleted

## 2017-02-09 VITALS — BP 130/62 | HR 86 | Ht 70.0 in | Wt 204.4 lb

## 2017-02-09 DIAGNOSIS — I480 Paroxysmal atrial fibrillation: Secondary | ICD-10-CM | POA: Diagnosis not present

## 2017-02-09 DIAGNOSIS — I10 Essential (primary) hypertension: Secondary | ICD-10-CM | POA: Diagnosis not present

## 2017-02-09 MED ORDER — RIVAROXABAN 20 MG PO TABS: 20.0000 mg | ORAL_TABLET | Freq: Every day | ORAL | 11 refills | Status: DC

## 2017-02-09 NOTE — Patient Instructions (Addendum)
Medication Instructions:  Your physician has recommended you make the following change in your medication:  1) START Xarelto - 20 mg once daily with supper   Labwork: None ordered   Testing/Procedures: None ordered   Follow-Up: Your physician wants you to follow-up in: February 2019 with Dr. Rayann Heman.    Any Other Special Instructions Will Be Listed Below (If Applicable).     If you need a refill on your cardiac medications before your next appointment, please call your pharmacy.

## 2017-02-09 NOTE — Progress Notes (Signed)
PCP: Manon Hilding, MD   Primary EP: Dr Teressa Senter is a 83 y.o. male who presents today for routine electrophysiology followup.  Since last being seen in our clinic, the patient reports doing reasonably well. He is planning for hernia surgery in several weeks.  He has begun having afib but is asymptomatic. Today, he denies symptoms of palpitations, chest pain, shortness of breath,  lower extremity edema, dizziness, presyncope, or syncope.  The patient is otherwise without complaint today.   Past Medical History:  Diagnosis Date  . Atrial fibrillation (HCC)    paroxysmal, s/p PVI by Dr Rayann Heman 8/12  . Atrial flutter ([place])    s/p CTI ablation  . Hyperlipidemia   . Obstructive sleep apnea (adult) (pediatric)    on CPAP   Past Surgical History:  Procedure Laterality Date  . ATRIAL ABLATION SURGERY     s/p CTI and PVI ablations by Dr Rayann Heman 8/12  . CARDIAC CATHETERIZATION     nonobstructive CAD 2000  . COLONOSCOPY WITH PROPOFOL N/A 08/24/2016   Procedure: COLONOSCOPY WITH PROPOFOL;  Surgeon: Teena Irani, MD;  [facility];  Service: Endoscopy;  Laterality: N/A;  . LAPAROSCOPIC RIGHT COLECTOMY Right 08/28/2016   Procedure: LAPAROSCOPIC ASSISTED RIGHT COLECTOMY;  Surgeon: Jovita Kussmaul, MD;  Location: [facility];  Service: General;  Laterality: Right;  . LOOP RECORDER INSERTION N/A 11/21/2016   Procedure: LOOP RECORDER INSERTION;  Surgeon: [name], MD;  Location: [facility];  Service: Cardiovascular;  Laterality: N/A;  . melanoma (other)     resected from his right shoulder    ROS- all systems are reviewed and negatives except as per HPI above  Current Outpatient Medications  Medication Sig Dispense Refill  . allopurinol (ZYLOPRIM) 100 MG tablet Take 1 tablet by mouth daily.    . clonazePAM (KLONOPIN) 0.5 MG tablet Take 0.25 mg by mouth at bedtime as needed for anxiety.     . colchicine 0.6 MG tablet Take 0.6 mg by mouth 2 (two) times daily as needed  (Gout).     . ferrous sulfate 325 (65 FE) MG tablet Take 325 mg by mouth daily with breakfast.    . Multiple Vitamins-Minerals (MULTIVITAMIN WITH MINERALS) tablet Take 1 tablet by mouth daily.     No current facility-administered medications for this visit.     Physical Exam: Vitals:   02/09/17 1428  BP: 130/62  Pulse: 86  SpO2: 99%  Weight: 204 lb 6.4 oz (92.7 kg)  Height: 5\' 10"  (1.778 m)    GEN- The patient is well appearing, alert and oriented x 3 today.   Head- normocephalic, atraumatic Eyes-  Sclera clear, conjunctiva pink Ears- hearing intact Oropharynx- clear Lungs- Clear to ausculation bilaterally, normal work of breathing Heart- irregular rate and rhythm, no murmurs, rubs or gallops, PMI not laterally displaced GI- soft, NT, ND, + BS Extremities- no clubbing, cyanosis, or edema Psych- very anxious (baseline)  EKG tracing ordered today is personally reviewed and shows afib, 79 bpm, nonspecific ST/ T changes  ILR is reviewed today personally and confirms afib with burden of 2.5 %  Assessment and Plan:  1. Persistent afib He has done great post ablation for many years.  More recently, he is now having afib.  ILR documents afib burden of 2.4%.  He is also in afib today but unaware.   Given chads2vasc score of at least 3, I have initiated on xarelto today.  He can hold for 24-48 hours prior  to his upcoming surgery.  No plans for cardioverion or medicine change currently as he is asymptomatic and mostly rate controlled.  Can restart coreg if V rates increase I will reassess after his surgery.  2. HTN Stable No change required today  3. preop Ok to proceed with surgery without further CV testing Hold xarelto 24-48 hours as surgery requires and resume post operatively when able.  Will follow remotely with carelink Return to see me in the [place] office in February.  [name] MD, [facility] 02/09/2017 3:09 PM

## 2017-02-09 NOTE — Telephone Encounter (Signed)
Spoke with patient and wife regarding AF noted on LINQ.  Patient is agreeable to moving his appointment with Dr. Rayann Heman from 03/02/17 to today at 2:15pm.  They are aware that appointment is at the [facility] office and not at the [facility] office.  Patient's wife reports he is having hernia repair surgery on 02/28/17 so he needed to move his 1/4 appointment anyway.  They are appreciative and deny additional questions or concerns at this time.

## 2017-02-12 ENCOUNTER — Other Ambulatory Visit: Payer: Self-pay | Admitting: Internal Medicine

## 2017-02-19 ENCOUNTER — Ambulatory Visit (INDEPENDENT_AMBULATORY_CARE_PROVIDER_SITE_OTHER): Payer: Medicare Other | Admitting: *Deleted

## 2017-02-19 DIAGNOSIS — I480 Paroxysmal atrial fibrillation: Secondary | ICD-10-CM

## 2017-02-21 NOTE — Progress Notes (Signed)
Carelink Summary Report / Loop Recorder 

## 2017-02-22 NOTE — Pre-Procedure Instructions (Signed)
Bradley Short  02/22/2017      [facility] [address] [place] [address] Phone: [phone] Fax: [phone]    Your procedure is scheduled on February 28, 2017.  Report to [facility] Admitting at 630 AM.  Call this number if you have problems the morning of surgery:  [phone]   Remember:  Do not eat food or drink liquids after midnight.  Take these medicines the morning of surgery with A SIP OF WATER tylenol-if needed, allopurinol (zyloprim).   Stop Xarelto per your surgeon's instructions  7 days prior to surgery STOP taking any Aspirin (unless otherwise instructed by your surgeon), Aleve, Naproxen, Ibuprofen, Motrin, Advil, Goody's, BC's, all herbal medications, fish oil, and all vitamins  Continue all other medications as instructed by your physician except follow the above medication instructions before surgery  Do not wear jewelry, make-up or nail polish.  Do not wear lotions, powders, or perfumes, or deodorant.  Men may shave face and neck.  Do not bring valuables to the hospital.  [facility] is not responsible for any belongings or valuables.  Contacts, dentures or bridgework may not be worn into surgery.  Leave your suitcase in the car.  After surgery it may be brought to your room.  For patients admitted to the hospital, discharge time will be determined by your treatment team.  Patients discharged the day of surgery will not be allowed to drive home.   Special instructions:  [place]- Preparing For Surgery  Before surgery, you can play an important role. Because skin is not sterile, your skin needs to be as free of germs as possible. You can reduce the number of germs on your skin by washing with CHG (chlorahexidine gluconate) Soap before surgery.  CHG is an antiseptic cleaner which kills germs and bonds with the skin to continue killing germs even after washing.  Please do not use if you have an  allergy to CHG or antibacterial soaps. If your skin becomes reddened/irritated stop using the CHG.  Do not shave (including legs and underarms) for at least 48 hours prior to first CHG shower. It is OK to shave your face.  Please follow these instructions carefully.   1. Shower the NIGHT BEFORE SURGERY and the MORNING OF SURGERY with CHG.   2. If you chose to wash your hair, wash your hair first as usual with your normal shampoo.  3. After you shampoo, rinse your hair and body thoroughly to remove the shampoo.  4. Use CHG as you would any other liquid soap. You can apply CHG directly to the skin and wash gently with a scrungie or a clean washcloth.   5. Apply the CHG Soap to your body ONLY FROM THE NECK DOWN.  Do not use on open wounds or open sores. Avoid contact with your eyes, ears, mouth and genitals (private parts). Wash Face and genitals (private parts)  with your normal soap.  6. Wash thoroughly, paying special attention to the area where your surgery will be performed.  7. Thoroughly rinse your body with warm water from the neck down.  8. DO NOT shower/wash with your normal soap after using and rinsing off the CHG Soap.  9. Pat yourself dry with a CLEAN TOWEL.  10. Wear CLEAN PAJAMAS to bed the night before surgery, wear comfortable clothes the morning of surgery  11. Place CLEAN SHEETS on your bed the night of  your first shower and DO NOT SLEEP WITH PETS.  Day of Surgery: Do not apply any deodorants/lotions. Please wear clean clothes to the hospital/surgery center.    Please read over the following fact sheets that you were given. Pain Booklet, Coughing and Deep Breathing and Surgical Site Infection Prevention

## 2017-02-22 NOTE — Progress Notes (Addendum)
PCP: Manon Hilding, MD  Cardiologist: Dr. Rayann Heman  EKG: 02/09/17 in EPIC  Stress test:  ECHO: 09/07/2010, 08/23/16 in EPIC  Cardiac Cath: 2000  Chest x-ray: 09/05/16 in Epic  Pt advised to stop Xarelto 02/24/17 for surgery 02/28/17.

## 2017-02-23 ENCOUNTER — Other Ambulatory Visit: Payer: Self-pay

## 2017-02-23 ENCOUNTER — Encounter (HOSPITAL_COMMUNITY)
Admission: RE | Admit: 2017-02-23 | Discharge: 2017-02-23 | Disposition: A | Discharge status: Home / Self Care | Payer: Medicare Other | Source: Ambulatory Visit | Attending: General Surgery | Admitting: General Surgery

## 2017-02-23 ENCOUNTER — Encounter (HOSPITAL_COMMUNITY): Payer: Self-pay

## 2017-02-23 DIAGNOSIS — I129 Hypertensive chronic kidney disease with stage 1 through stage 4 chronic kidney disease, or unspecified chronic kidney disease: Secondary | ICD-10-CM | POA: Insufficient documentation

## 2017-02-23 DIAGNOSIS — Z01818 Encounter for other preprocedural examination: Secondary | ICD-10-CM | POA: Diagnosis not present

## 2017-02-23 DIAGNOSIS — E7800 Pure hypercholesterolemia, unspecified: Secondary | ICD-10-CM | POA: Diagnosis not present

## 2017-02-23 DIAGNOSIS — I4891 Unspecified atrial fibrillation: Secondary | ICD-10-CM | POA: Diagnosis not present

## 2017-02-23 DIAGNOSIS — Z79899 Other long term (current) drug therapy: Secondary | ICD-10-CM | POA: Insufficient documentation

## 2017-02-23 DIAGNOSIS — Z7901 Long term (current) use of anticoagulants: Secondary | ICD-10-CM | POA: Diagnosis not present

## 2017-02-23 DIAGNOSIS — K439 Ventral hernia without obstruction or gangrene: Secondary | ICD-10-CM | POA: Insufficient documentation

## 2017-02-23 DIAGNOSIS — N183 Chronic kidney disease, stage 3 (moderate): Secondary | ICD-10-CM | POA: Diagnosis not present

## 2017-02-23 DIAGNOSIS — G4733 Obstructive sleep apnea (adult) (pediatric): Secondary | ICD-10-CM | POA: Diagnosis not present

## 2017-02-23 HISTORY — DX: Cardiac arrhythmia, unspecified: I49.9

## 2017-02-23 HISTORY — DX: Unspecified osteoarthritis, unspecified site: M19.90

## 2017-02-23 HISTORY — DX: Malignant (primary) neoplasm, unspecified: C80.1

## 2017-02-23 LAB — CBC
HEMATOCRIT: 47.5 % (ref 39.0–52.0)
HEMOGLOBIN: 15.8 g/dL (ref 13.0–17.0)
MCH: 30.4 pg (ref 26.0–34.0)
MCHC: 33.3 g/dL (ref 30.0–36.0)
MCV: 91.5 fL (ref 78.0–100.0)
Platelets: 157 10*3/uL (ref 150–400)
RBC: 5.19 MIL/uL (ref 4.22–5.81)
RDW: 17.1 % — ABNORMAL HIGH (ref 11.5–15.5)
WBC: 10.4 10*3/uL (ref 4.0–10.5)

## 2017-02-23 LAB — BASIC METABOLIC PANEL
Anion gap: 8 (ref 5–15)
BUN: 12 mg/dL (ref 6–20)
CHLORIDE: 105 mmol/L (ref 101–111)
CO2: 26 mmol/L (ref 22–32)
CREATININE: 1.23 mg/dL (ref 0.61–1.24)
Calcium: 9 mg/dL (ref 8.9–10.3)
GFR calc Af Amer: >60 mL/min (ref >60)
GFR calc non Af Amer: 53 mL/min — ABNORMAL LOW (ref >60)
Glucose, Bld: 83 mg/dL (ref 65–99)
POTASSIUM: 4.2 mmol/L (ref 3.5–5.1)
Sodium: 139 mmol/L (ref 135–145)

## 2017-02-23 LAB — PROTIME-INR
INR: 1.23
PROTHROMBIN TIME: 15.4 s — AB (ref 11.4–15.2)

## 2017-02-23 MED ORDER — CHLORHEXIDINE GLUCONATE CLOTH 2 % EX PADS: 6.0000 | MEDICATED_PAD | Freq: Once | CUTANEOUS | Status: DC

## 2017-02-26 NOTE — Progress Notes (Signed)
Anesthesia Chart Review: Patient is a 83 year old male scheduled for laparoscopic ventral hernia repair with mesh on 02/28/17 by Dr. Autumn Messing.   History includes never smoker, atrial flutter/paroxysmal afib (s/p radiofrequency ablation of SVT 09/27/10; s/p cardioversion 10/12/10; recurrent afib post-op 08/2016; s/p [employer] implantable loop recorder 11/21/16), OSA (intolerant of CPAP), high dysplasia right colon mass with GI bleed (s/p laparoscopic assisted right colectomy 08/28/16), arthritis, melanoma resection (right shoulder). Notes indicate non-obstructive CAD by cath in 2000 (but I do not have a copy of the actual report).  - PCP is Dr. Consuello Masse. - Primary cardiologist is Dr. Minus Breeding. - EP cardiologist is Dr. [name]. Last visit 02/09/17. He was back in afib, but asymptomatic. ILD had been recently placed (10/2016) to help determine whether or not patient should stay on anticoagulation long term. In review, ILR showed afib burden of 2.4%. Given chads2vasc score of at least 3, he was started on Xarelto. Dr. Rayann Heman gave permission for patient to hold for 24-48 hours prior to his upcoming surgery. No plans for cardioverion or medicine change at that time as he was asymptomatic and mostly rate controlled. Consider restarting Coreg if rate increases--Dr. Allred to reassess after surgery.    Meds include allopurinol, Klonopin, colchicine, ferrous sulfate, Xarelto (last dose 02/24/17).  BP (!) 125/59   Pulse 66   Temp 36.7 C (Oral)   Resp 18   Ht 5\' 10"  (1.778 m)   Wt 201 lb 11.5 oz (91.5 kg)   SpO2 100%   BMI 28.94 kg/m   EKG 02/09/17: Coarse afib at 79 bpm, inferior infarct (age undetermined), ST/T wave abnormality, consider lateral ischemia.  Echo 08/23/16: Impressions: - Normal LV size with mild LV hypertrophy. EF 60-65%. Moderate (grade II) diastolic dysfunction. Normal RV size and systolic function. Moderate LAE. No significant valvular abnormalities (trivial MR). No complete TR  doppler jet so unable to estimate PA systolic pressure. The respirophasic diameter changes were in the normal range (>= 50%), consistent with normal central venous pressure.  CXR 09/05/16: IMPRESSION: Mild bibasilar atelectasis. No edema or consolidation. Stable cardiac silhouette. There is aortic atherosclerosis. Central catheter tip in superior vena cava. No pneumothorax. Aortic Atherosclerosis (ICD10-I70.0).  Preoperative labs noted. Cr 1.23, H/H 15.8/47.5. PLT 157. PT/INR 15.4/1.23. Glucose 83.   If no acute changes then I would anticipate that he can proceed as planned.  Bradley Short [facility]/Anesthesiology Phone [phone] 02/26/2017 10:20 AM

## 2017-02-28 ENCOUNTER — Encounter (HOSPITAL_COMMUNITY)
Admission: RE | Disposition: A | Discharge status: Home Health | Payer: Self-pay | Source: Ambulatory Visit | Attending: General Surgery

## 2017-02-28 ENCOUNTER — Ambulatory Visit (HOSPITAL_COMMUNITY): Payer: Medicare Other | Admitting: Anesthesiology

## 2017-02-28 ENCOUNTER — Other Ambulatory Visit: Payer: Self-pay

## 2017-02-28 ENCOUNTER — Inpatient Hospital Stay (HOSPITAL_COMMUNITY)
Admission: RE | Admit: 2017-02-28 | Discharge: 2017-03-05 | DRG: 355 | Disposition: A | Discharge status: Home Health | Payer: Medicare Other | Source: Ambulatory Visit | Attending: General Surgery | Admitting: General Surgery

## 2017-02-28 ENCOUNTER — Encounter (HOSPITAL_COMMUNITY): Payer: Self-pay | Admitting: *Deleted

## 2017-02-28 ENCOUNTER — Ambulatory Visit (HOSPITAL_COMMUNITY): Payer: Medicare Other | Admitting: Vascular Surgery

## 2017-02-28 DIAGNOSIS — Y92230 Patient room in hospital as the place of occurrence of the external cause: Secondary | ICD-10-CM | POA: Diagnosis not present

## 2017-02-28 DIAGNOSIS — G4730 Sleep apnea, unspecified: Secondary | ICD-10-CM | POA: Diagnosis present

## 2017-02-28 DIAGNOSIS — Z72820 Sleep deprivation: Secondary | ICD-10-CM | POA: Diagnosis not present

## 2017-02-28 DIAGNOSIS — K439 Ventral hernia without obstruction or gangrene: Secondary | ICD-10-CM | POA: Diagnosis not present

## 2017-02-28 DIAGNOSIS — T461X5A Adverse effect of calcium-channel blockers, initial encounter: Secondary | ICD-10-CM | POA: Diagnosis not present

## 2017-02-28 DIAGNOSIS — T462X5A Adverse effect of other antidysrhythmic drugs, initial encounter: Secondary | ICD-10-CM | POA: Diagnosis not present

## 2017-02-28 DIAGNOSIS — I4891 Unspecified atrial fibrillation: Secondary | ICD-10-CM | POA: Diagnosis present

## 2017-02-28 DIAGNOSIS — I129 Hypertensive chronic kidney disease with stage 1 through stage 4 chronic kidney disease, or unspecified chronic kidney disease: Secondary | ICD-10-CM | POA: Diagnosis not present

## 2017-02-28 DIAGNOSIS — Z7901 Long term (current) use of anticoagulants: Secondary | ICD-10-CM | POA: Diagnosis not present

## 2017-02-28 DIAGNOSIS — F419 Anxiety disorder, unspecified: Secondary | ICD-10-CM | POA: Diagnosis not present

## 2017-02-28 DIAGNOSIS — I10 Essential (primary) hypertension: Secondary | ICD-10-CM | POA: Diagnosis not present

## 2017-02-28 DIAGNOSIS — N183 Chronic kidney disease, stage 3 (moderate): Secondary | ICD-10-CM | POA: Diagnosis not present

## 2017-02-28 DIAGNOSIS — Z9049 Acquired absence of other specified parts of digestive tract: Secondary | ICD-10-CM | POA: Diagnosis not present

## 2017-02-28 DIAGNOSIS — R21 Rash and other nonspecific skin eruption: Secondary | ICD-10-CM | POA: Diagnosis not present

## 2017-02-28 DIAGNOSIS — Z888 Allergy status to other drugs, medicaments and biological substances status: Secondary | ICD-10-CM

## 2017-02-28 HISTORY — PX: INSERTION OF MESH: SHX5868

## 2017-02-28 HISTORY — PX: VENTRAL HERNIA REPAIR: SHX424

## 2017-02-28 SURGERY — REPAIR, HERNIA, VENTRAL, LAPAROSCOPIC
Anesthesia: General | Site: Abdomen

## 2017-02-28 MED ORDER — MORPHINE SULFATE (PF) 4 MG/ML IV SOLN: 1.0000 mg | INTRAVENOUS | Status: DC | PRN

## 2017-02-28 MED ORDER — DEXAMETHASONE SODIUM PHOSPHATE 10 MG/ML IJ SOLN
INTRAMUSCULAR | Status: DC | PRN
  Administered 2017-02-28: 10 mg via INTRAVENOUS

## 2017-02-28 MED ORDER — CLONAZEPAM 0.5 MG PO TABS
0.2500 mg | ORAL_TABLET | Freq: Every evening | ORAL | Status: DC | PRN
Start: 2017-02-28 — End: 2017-02-28

## 2017-02-28 MED ORDER — PROPOFOL 10 MG/ML IV BOLUS
INTRAVENOUS | Status: AC
  Filled 2017-02-28: qty 20

## 2017-02-28 MED ORDER — PROPOFOL 10 MG/ML IV BOLUS
INTRAVENOUS | Status: DC | PRN
  Administered 2017-02-28: 130 mg via INTRAVENOUS

## 2017-02-28 MED ORDER — CEFAZOLIN SODIUM-DEXTROSE 2-4 GM/100ML-% IV SOLN
2.0000 g | INTRAVENOUS | Status: AC
  Administered 2017-02-28: 2 g via INTRAVENOUS
  Filled 2017-02-28: qty 100

## 2017-02-28 MED ORDER — DEXTROSE 5 % IV SOLN
INTRAVENOUS | Status: DC | PRN
  Administered 2017-02-28: 10 ug/min via INTRAVENOUS

## 2017-02-28 MED ORDER — COLCHICINE 0.6 MG PO TABS: 0.6000 mg | ORAL_TABLET | Freq: Two times a day (BID) | ORAL | Status: DC | PRN

## 2017-02-28 MED ORDER — 0.9 % SODIUM CHLORIDE (POUR BTL) OPTIME
TOPICAL | Status: DC | PRN
  Administered 2017-02-28: 1000 mL

## 2017-02-28 MED ORDER — DEXAMETHASONE SODIUM PHOSPHATE 10 MG/ML IJ SOLN
INTRAMUSCULAR | Status: AC
  Filled 2017-02-28: qty 1

## 2017-02-28 MED ORDER — ALLOPURINOL 100 MG PO TABS
100.0000 mg | ORAL_TABLET | Freq: Every day | ORAL | Status: DC
  Administered 2017-02-28 – 2017-03-04 (×5): 100 mg via ORAL
  Filled 2017-02-28 (×6): qty 1

## 2017-02-28 MED ORDER — CELECOXIB 200 MG PO CAPS
200.0000 mg | ORAL_CAPSULE | ORAL | Status: AC
  Administered 2017-02-28: 200 mg via ORAL
  Filled 2017-02-28: qty 1

## 2017-02-28 MED ORDER — ONDANSETRON HCL 4 MG/2ML IJ SOLN
INTRAMUSCULAR | Status: AC
  Filled 2017-02-28: qty 2

## 2017-02-28 MED ORDER — PANTOPRAZOLE SODIUM 40 MG IV SOLR
40.0000 mg | Freq: Every day | INTRAVENOUS | Status: DC
  Administered 2017-02-28 – 2017-03-01 (×2): 40 mg via INTRAVENOUS
  Filled 2017-02-28 (×3): qty 40

## 2017-02-28 MED ORDER — HYDROMORPHONE HCL 1 MG/ML IJ SOLN
0.2500 mg | INTRAMUSCULAR | Status: DC | PRN
  Administered 2017-02-28: 0.25 mg via INTRAVENOUS

## 2017-02-28 MED ORDER — OXYCODONE HCL 5 MG/5ML PO SOLN: 5.0000 mg | Freq: Once | ORAL | Status: DC | PRN

## 2017-02-28 MED ORDER — ONDANSETRON 4 MG PO TBDP: 4.0000 mg | ORAL_TABLET | Freq: Four times a day (QID) | ORAL | Status: DC | PRN

## 2017-02-28 MED ORDER — FENTANYL CITRATE (PF) 250 MCG/5ML IJ SOLN
INTRAMUSCULAR | Status: AC
  Filled 2017-02-28: qty 5

## 2017-02-28 MED ORDER — FENTANYL CITRATE (PF) 250 MCG/5ML IJ SOLN
INTRAMUSCULAR | Status: AC
Start: 2017-02-28 — End: 2017-02-28
  Filled 2017-02-28: qty 5

## 2017-02-28 MED ORDER — ONDANSETRON HCL 4 MG/2ML IJ SOLN: 4.0000 mg | Freq: Once | INTRAMUSCULAR | Status: DC | PRN

## 2017-02-28 MED ORDER — FENTANYL CITRATE (PF) 100 MCG/2ML IJ SOLN
INTRAMUSCULAR | Status: DC | PRN
  Administered 2017-02-28: 50 ug via INTRAVENOUS
  Administered 2017-02-28: 100 ug via INTRAVENOUS
  Administered 2017-02-28 (×3): 50 ug via INTRAVENOUS

## 2017-02-28 MED ORDER — ONDANSETRON HCL 4 MG/2ML IJ SOLN
INTRAMUSCULAR | Status: DC | PRN
  Administered 2017-02-28: 4 mg via INTRAVENOUS

## 2017-02-28 MED ORDER — GABAPENTIN 300 MG PO CAPS
300.0000 mg | ORAL_CAPSULE | ORAL | Status: AC
  Administered 2017-02-28: 300 mg via ORAL
  Filled 2017-02-28: qty 1

## 2017-02-28 MED ORDER — LIDOCAINE 2% (20 MG/ML) 5 ML SYRINGE
INTRAMUSCULAR | Status: AC
  Filled 2017-02-28: qty 5

## 2017-02-28 MED ORDER — SUGAMMADEX SODIUM 200 MG/2ML IV SOLN
INTRAVENOUS | Status: DC | PRN
  Administered 2017-02-28: 200 mg via INTRAVENOUS

## 2017-02-28 MED ORDER — ONDANSETRON HCL 4 MG/2ML IJ SOLN: 4.0000 mg | Freq: Four times a day (QID) | INTRAMUSCULAR | Status: DC | PRN

## 2017-02-28 MED ORDER — ACETAMINOPHEN 500 MG PO TABS
1000.0000 mg | ORAL_TABLET | ORAL | Status: AC
  Administered 2017-02-28: 1000 mg via ORAL
  Filled 2017-02-28: qty 2

## 2017-02-28 MED ORDER — METHOCARBAMOL 500 MG PO TABS
500.0000 mg | ORAL_TABLET | Freq: Four times a day (QID) | ORAL | Status: DC | PRN
  Administered 2017-02-28 – 2017-03-01 (×3): 500 mg via ORAL
  Filled 2017-02-28 (×3): qty 1

## 2017-02-28 MED ORDER — ROCURONIUM BROMIDE 100 MG/10ML IV SOLN
INTRAVENOUS | Status: DC | PRN
  Administered 2017-02-28: 10 mg via INTRAVENOUS
  Administered 2017-02-28: 20 mg via INTRAVENOUS
  Administered 2017-02-28: 50 mg via INTRAVENOUS

## 2017-02-28 MED ORDER — HEPARIN SODIUM (PORCINE) 5000 UNIT/ML IJ SOLN: 5000.0000 [IU] | Freq: Three times a day (TID) | INTRAMUSCULAR | Status: DC

## 2017-02-28 MED ORDER — LACTATED RINGERS IV SOLN: INTRAVENOUS | Status: DC | PRN

## 2017-02-28 MED ORDER — DIPHENHYDRAMINE HCL 50 MG/ML IJ SOLN
25.0000 mg | Freq: Four times a day (QID) | INTRAMUSCULAR | Status: DC | PRN
  Administered 2017-02-28 – 2017-03-01 (×4): 25 mg via INTRAVENOUS
  Filled 2017-02-28 (×4): qty 1

## 2017-02-28 MED ORDER — SODIUM CHLORIDE 0.9 % IV SOLN
INTRAVENOUS | Status: DC
  Administered 2017-02-28: 18:00:00 via INTRAVENOUS

## 2017-02-28 MED ORDER — SUGAMMADEX SODIUM 200 MG/2ML IV SOLN
INTRAVENOUS | Status: AC
  Filled 2017-02-28: qty 2

## 2017-02-28 MED ORDER — OXYCODONE HCL 5 MG PO TABS: 5.0000 mg | ORAL_TABLET | Freq: Once | ORAL | Status: DC | PRN

## 2017-02-28 MED ORDER — KCL IN DEXTROSE-NACL 20-5-0.9 MEQ/L-%-% IV SOLN
INTRAVENOUS | Status: DC
  Administered 2017-02-28: 15:00:00 via INTRAVENOUS
  Filled 2017-02-28: qty 1000

## 2017-02-28 MED ORDER — HYDROMORPHONE HCL 1 MG/ML IJ SOLN
INTRAMUSCULAR | Status: AC
  Filled 2017-02-28: qty 1

## 2017-02-28 MED ORDER — BUPIVACAINE-EPINEPHRINE 0.25% -1:200000 IJ SOLN
INTRAMUSCULAR | Status: DC | PRN
  Administered 2017-02-28: 30 mL

## 2017-02-28 MED ORDER — HYDROCODONE-ACETAMINOPHEN 5-325 MG PO TABS: 1.0000 | ORAL_TABLET | ORAL | Status: DC | PRN

## 2017-02-28 MED ORDER — ROCURONIUM BROMIDE 10 MG/ML (PF) SYRINGE
PREFILLED_SYRINGE | INTRAVENOUS | Status: AC
  Filled 2017-02-28: qty 5

## 2017-02-28 MED ORDER — LIDOCAINE HCL (CARDIAC) 20 MG/ML IV SOLN
INTRAVENOUS | Status: DC | PRN
  Administered 2017-02-28: 60 mg via INTRAVENOUS

## 2017-02-28 MED ORDER — RIVAROXABAN 20 MG PO TABS
20.0000 mg | ORAL_TABLET | Freq: Every day | ORAL | Status: DC
  Administered 2017-03-01 – 2017-03-04 (×4): 20 mg via ORAL
  Filled 2017-02-28 (×5): qty 1

## 2017-02-28 MED ORDER — BUPIVACAINE-EPINEPHRINE (PF) 0.25% -1:200000 IJ SOLN
INTRAMUSCULAR | Status: AC
  Filled 2017-02-28: qty 60

## 2017-02-28 MED ORDER — LACTATED RINGERS IV SOLN
INTRAVENOUS | Status: DC | PRN
  Administered 2017-02-28: 08:00:00 via INTRAVENOUS

## 2017-02-28 SURGICAL SUPPLY — 48 items
APPLIER CLIP LOGIC TI 5 (MISCELLANEOUS) IMPLANT
APPLIER CLIP ROT 10 11.4 M/L (STAPLE)
BINDER ABDOMINAL 12 ML 46-62 (SOFTGOODS) IMPLANT
BNDG GAUZE ELAST 4 BULKY (GAUZE/BANDAGES/DRESSINGS) IMPLANT
CANISTER SUCT 3000ML PPV (MISCELLANEOUS) IMPLANT
CHLORAPREP W/TINT 26ML (MISCELLANEOUS) ×3 IMPLANT
CLIP APPLIE ROT 10 11.4 M/L (STAPLE) IMPLANT
COVER SURGICAL LIGHT HANDLE (MISCELLANEOUS) ×3 IMPLANT
DERMABOND ADVANCED (GAUZE/BANDAGES/DRESSINGS) ×2
DERMABOND ADVANCED .7 DNX12 (GAUZE/BANDAGES/DRESSINGS) ×1 IMPLANT
DEVICE SECURE STRAP 25 ABSORB (INSTRUMENTS) ×6 IMPLANT
DEVICE TROCAR PUNCTURE CLOSURE (ENDOMECHANICALS) ×3 IMPLANT
DRAIN CHANNEL 19F RND (DRAIN) ×3 IMPLANT
DRAPE INCISE IOBAN 66X45 STRL (DRAPES) ×3 IMPLANT
DRAPE LAPAROSCOPIC ABDOMINAL (DRAPES) ×3 IMPLANT
ELECT CAUTERY BLADE 6.4 (BLADE) ×3 IMPLANT
ELECT REM PT RETURN 9FT ADLT (ELECTROSURGICAL) ×3
ELECTRODE REM PT RTRN 9FT ADLT (ELECTROSURGICAL) ×1 IMPLANT
EVACUATOR SILICONE 100CC (DRAIN) ×3 IMPLANT
GLOVE BIO SURGEON STRL SZ7.5 (GLOVE) ×3 IMPLANT
GOWN STRL REUS W/ TWL LRG LVL3 (GOWN DISPOSABLE) ×3 IMPLANT
GOWN STRL REUS W/TWL LRG LVL3 (GOWN DISPOSABLE) ×6
KIT BASIN OR (CUSTOM PROCEDURE TRAY) ×3 IMPLANT
KIT ROOM TURNOVER OR (KITS) ×3 IMPLANT
MARKER SKIN DUAL TIP RULER LAB (MISCELLANEOUS) ×3 IMPLANT
MESH VENTRALIGHT ST 6X8 (Mesh Specialty) ×2 IMPLANT
MESH VENTRLGHT ELLIPSE 8X6XMFL (Mesh Specialty) ×1 IMPLANT
NEEDLE SPNL 22GX3.5 QUINCKE BK (NEEDLE) ×3 IMPLANT
NS IRRIG 1000ML POUR BTL (IV SOLUTION) ×3 IMPLANT
PAD ARMBOARD 7.5X6 YLW CONV (MISCELLANEOUS) ×6 IMPLANT
PENCIL BUTTON HOLSTER BLD 10FT (ELECTRODE) ×3 IMPLANT
SCISSORS LAP 5X35 DISP (ENDOMECHANICALS) ×3 IMPLANT
SET IRRIG TUBING LAPAROSCOPIC (IRRIGATION / IRRIGATOR) IMPLANT
SHEARS HARMONIC ACE PLUS 36CM (ENDOMECHANICALS) IMPLANT
SLEEVE ENDOPATH XCEL 5M (ENDOMECHANICALS) ×3 IMPLANT
SUT ETHILON 2 0 FS 18 (SUTURE) ×3 IMPLANT
SUT MNCRL AB 4-0 PS2 18 (SUTURE) ×6 IMPLANT
SUT NOVA NAB DX-16 0-1 5-0 T12 (SUTURE) ×15 IMPLANT
SUT VIC AB 3-0 SH 27 (SUTURE) ×2
SUT VIC AB 3-0 SH 27XBRD (SUTURE) ×1 IMPLANT
TOWEL OR 17X24 6PK STRL BLUE (TOWEL DISPOSABLE) ×3 IMPLANT
TOWEL OR 17X26 10 PK STRL BLUE (TOWEL DISPOSABLE) ×3 IMPLANT
TRAY FOLEY CATH SILVER 16FR (SET/KITS/TRAYS/PACK) ×3 IMPLANT
TRAY LAPAROSCOPIC MC (CUSTOM PROCEDURE TRAY) ×3 IMPLANT
TROCAR XCEL BLUNT TIP 100MML (ENDOMECHANICALS) IMPLANT
TROCAR XCEL NON-BLD 11X100MML (ENDOMECHANICALS) IMPLANT
TROCAR XCEL NON-BLD 5MMX100MML (ENDOMECHANICALS) ×6 IMPLANT
TUBING INSUFFLATION (TUBING) ×3 IMPLANT

## 2017-02-28 NOTE — Op Note (Signed)
02/28/2017  11:06 AM  PATIENT:  Bradley Short  84 y.o. male  PRE-OPERATIVE DIAGNOSIS:  VENTRAL HERNIA  POST-OPERATIVE DIAGNOSIS:  VENTRAL HERNIA  PROCEDURE:  Procedure(s): LAPAROSCOPIC VENTRAL HERNIA REPAIR WITH MESH (N/A) INSERTION OF MESH (N/A)  SURGEON:  Surgeon(s) and Role:    * Jovita Kussmaul, MD - Primary  PHYSICIAN ASSISTANT:   ASSISTANTS: none   ANESTHESIA:   local and general  EBL:  50 mL   BLOOD ADMINISTERED:none  DRAINS: (1) Jackson-Pratt drain(s) with closed bulb suction in the subcutaneous space   LOCAL MEDICATIONS USED:  MARCAINE     SPECIMEN:  No Specimen  DISPOSITION OF SPECIMEN:  N/A  COUNTS:  YES  TOURNIQUET:  * No tourniquets in log *  DICTATION: .Dragon Dictation   After informed consent was obtained the patient was brought to the operating room and placed in the supine position on the operating table.  After adequate induction of general anesthesia the patient's abdomen was prepped with ChloraPrep, allowed to dry, and draped in usual sterile manner including the use of an Ioban drape.  An appropriate timeout was performed.  A site was chosen to access the abdomen in the left upper quadrant.  This area was infiltrated with quarter percent Marcaine.  A small incision was made with a 15 blade knife.  A 5 mm Optiview port and camera were used to bluntly dissected the layers of the abdominal wall under direct vision until access was gained to the abdominal cavity.  The abdomen was then insufflated with carbon dioxide without difficulty.  The abdomen was inspected and there were some adhesions to the anterior abdominal wall as well as a ventral hernia.  Another port was placed in the left lower lateral abdominal wall and 2 ports were placed on the right mid lateral abdominal wall under direct vision.  I was able to lyse the adhesions to the anterior abdominal wall sharply with laparoscopic scissors.  I was able to visualize the bowel and stay away from the bowel  during this portion of the procedure.  The falciform ligament was also taken down sharply with laparoscopic scissors.  Once this was accomplished the abdominal wall was then free of any adhesion.  The size of the hernia defect was estimated using a spinal needle and a ruler.  I chose a 15 x 20 cm piece of ventral light mesh.  8 #1 Novafil stitches were placed at equidistant points around the edge of the mesh.  The mesh was oriented with the coated side towards the bowel.  Next a small incision was made through his previous small upper midline incision with a 15 blade knife.  The electrocautery was then used to dissected the subcutaneous tissue and into the hernia sac.  The hernia sac was excised sharply with the electrocautery.  The fascial edges were identified.  The fascial edges appeared thick and healthy.  The mesh was then placed into the abdominal cavity through this opening and oriented appropriately.  The fascial defect was then closed with interrupted #1 Novafil stitches.  The abdomen was then insufflated again and 8 small stab incisions were made at points that corresponded to the 8 anchoring stitches.  A suture passer was used to bring the tails of each stitch through the abdominal wall.  Each stitch was then cinched down and tied.  The mesh was appear to be in good apposition to the abdominal wall without any redundancy.  The spaces between the anchor stitches were then filled in  with absorbable tacks.  Once this was accomplished the mesh was again visualized to be in good apposition to the abdominal wall without any redundancy or folds.  The area was examined and found to be hemostatic.  At this point the gas was allowed to escape and the ports were removed without difficulty.  The subcutaneous tissue of the main central incision was irrigated with copious amounts of saline.  This incision was closed with a deep layer of running 2-0 Vicryl stitch.  All the skin incisions were then closed with  interrupted 4-0 Monocryl subcuticular stitches.  Dermabond dressings were applied.  A 19 French round Blake drain was placed through the lateralmost lower 5 mm port site and into that subcutaneous space just above the fascia along the central incision and this drain was anchored to the skin with a 2-0 nylon stitch.  That incision was then closed over the drain.  The patient tolerated the procedure well.  At the end of the case all needle sponge and instrument counts were correct.  The patient was then awakened and taken to recovery in stable condition.  The abdomen was generally inspected before closing and no other abnormalities were noted.  PLAN OF CARE: Admit to inpatient   PATIENT DISPOSITION:  PACU - hemodynamically stable.   Delay start of Pharmacological VTE agent (>24hrs) due to surgical blood loss or risk of bleeding: no

## 2017-02-28 NOTE — Progress Notes (Signed)
Patient concerned that he was having an allergic reaction due to redness and itching to his upper arms bilaterally and his upper legs bilaterally. Per patient and family a similar experience has occurred. IV fluids were stopped and MD notified. No known drug allergy medications were administered to patient. New orders placed and being implemented. Will continue to monitor patient status.

## 2017-02-28 NOTE — Transfer of Care (Signed)
Immediate Anesthesia Transfer of Care Note  Patient: Bradley Short  Procedure(s) Performed: LAPAROSCOPIC VENTRAL HERNIA REPAIR WITH MESH (N/A Abdomen) INSERTION OF MESH (N/A Abdomen)  Patient Location: PACU  Anesthesia Type:General  Level of Consciousness: awake, alert  and oriented  Airway & Oxygen Therapy: Patient Spontanous Breathing and Patient connected to nasal cannula oxygen  Post-op Assessment: Report given to RN, Post -op Vital signs reviewed and stable and Patient moving all extremities X 4  Post vital signs: Reviewed and stable  Last Vitals:  Vitals:   02/28/17 0657 02/28/17 1122  BP: 136/65 (!) 132/57  Pulse: 65 71  Resp: 18 20  Temp: 37.2 C 36.5 C  SpO2: 100% 96%    Last Pain:  Vitals:   02/28/17 1122  TempSrc:   PainSc: (P) 0-No pain         Complications: No apparent anesthesia complications

## 2017-02-28 NOTE — Interval H&P Note (Signed)
History and Physical Interval Note:  02/28/2017 7:22 AM  Bradley Short  has presented today for surgery, with the diagnosis of VENTRAL HERNIA  The various methods of treatment have been discussed with the patient and family. After consideration of risks, benefits and other options for treatment, the patient has consented to  Procedure(s): Moran (N/A) INSERTION OF MESH (N/A) as a surgical intervention .  The patient's history has been reviewed, patient examined, no change in status, stable for surgery.  I have reviewed the patient's chart and labs.  Questions were answered to the patient's satisfaction.     TOTH III,PAUL S

## 2017-02-28 NOTE — Anesthesia Procedure Notes (Signed)
Procedure Name: Intubation Date/Time: 02/28/2017 8:37 AM Performed by: Kyung Rudd, CRNA Pre-anesthesia Checklist: Patient identified, Emergency Drugs available, Suction available and Patient being monitored Patient Re-evaluated:Patient Re-evaluated prior to induction Oxygen Delivery Method: Circle system utilized Preoxygenation: Pre-oxygenation with 100% oxygen Induction Type: IV induction Ventilation: Mask ventilation without difficulty Laryngoscope Size: Mac and 4 Grade View: Grade I Tube type: Oral Tube size: 7.5 mm Number of attempts: 1 Airway Equipment and Method: Stylet Placement Confirmation: ETT inserted through vocal cords under direct vision,  positive ETCO2 and breath sounds checked- equal and bilateral Secured at: 22 cm Tube secured with: Tape Dental Injury: Teeth and Oropharynx as per pre-operative assessment

## 2017-02-28 NOTE — Anesthesia Preprocedure Evaluation (Addendum)
Anesthesia Evaluation  Patient identified by MRN, date of birth, ID band Patient awake    Reviewed: Allergy & Precautions, H&P , NPO status , Patient's Chart, lab work & pertinent test results  History of Anesthesia Complications (+) PONV  Airway Mallampati: I  TM Distance: <3 FB Neck ROM: Full    Dental  (+) Teeth Intact   Pulmonary sleep apnea ,  Does not use CPAP   Pulmonary exam normal        Cardiovascular Exercise Tolerance: Good hypertension, (-) CAD + dysrhythmias Atrial Fibrillation  Rhythm:Regular Rate:Normal  Pt had an ablation in past. Pt on Xarelto. Pt instructructed to stop Xarelto and did so on 12/29.    Neuro/Psych    GI/Hepatic   Endo/Other    Renal/GU      Musculoskeletal  (+) Arthritis ,   Abdominal   Peds  Hematology   Anesthesia Other Findings   Reproductive/Obstetrics                           Anesthesia Physical Anesthesia Plan  ASA: III  Anesthesia Plan: General   Post-op Pain Management:    Induction: Intravenous  PONV Risk Score and Plan: 3 and Ondansetron, Dexamethasone and Treatment may vary due to age or medical condition  Airway Management Planned: Oral ETT  Additional Equipment:   Intra-op Plan:   Post-operative Plan: Extubation in OR  Informed Consent: I have reviewed the patients History and Physical, chart, labs and discussed the procedure including the risks, benefits and alternatives for the proposed anesthesia with the patient or authorized representative who has indicated his/her understanding and acceptance.     Plan Discussed with: CRNA, Anesthesiologist and Surgeon  Anesthesia Plan Comments:         Anesthesia Quick Evaluation

## 2017-02-28 NOTE — H&P (Signed)
Bradley Short  Location: [facility] Patient #: [id number] DOB: 09-25-1932 Married / Language: English / Race: White Male   History of Present Illness The patient is a 84 year old male who presents for a follow-up for Abdominal pain. 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 He tolerated the surgery reasonably well. Unfortunately he has also developed a ventral hernia. He felt like he over the last few days his hernias gotten larger. He denies any significant abdominal pain. He denies any nausea or vomiting.   Allergies  Amiodarone HCl *ANTIARRHYTHMICS*  DilTIAZem HCl *CHEMICALS*  Lisinopril *ANTIHYPERTENSIVES*  Metoprolol Succinate *BETA BLOCKERS*  Toradol IM *ANALGESICS - ANTI-INFLAMMATORY*  Propafenone HCl *ANTIARRHYTHMICS*  Allergies Reconciled   Medication History  Colchicine (0.6MG  Tablet, Oral) Active. Iron (325 (65 Fe)MG Tablet, Oral) Active. Multivitamins (Oral) Active. Allopurinol (100MG  Tablet, Oral) Active. Medications Reconciled    Review of Systems  General Present- Fatigue. Not Present- Appetite Loss, Chills, Fever, Night Sweats, Weight Gain and Weight Loss. Skin Present- Dryness. Not Present- Change in Wart/Mole, Hives, Jaundice, New Lesions, Non-Healing Wounds, Rash and Ulcer. HEENT Present- Hearing Loss. Not Present- Earache, Hoarseness, Nose Bleed, Oral Ulcers, Ringing in the Ears, Seasonal Allergies, Sinus Pain, Sore Throat, Visual Disturbances, Wears glasses/contact lenses and Yellow Eyes. Respiratory Present- Difficulty Breathing and Snoring. Not Present- Bloody sputum, Chronic Cough and Wheezing. Cardiovascular Present- Shortness of Breath and Swelling of Extremities. Not Present- Chest Pain, Difficulty Breathing Lying Down, Leg Cramps, Palpitations and Rapid Heart Rate. Gastrointestinal Present- Change in Bowel Habits. Not Present- Abdominal Pain,  Bloating, Bloody Stool, Chronic diarrhea, Constipation, Difficulty Swallowing, Excessive gas, Gets full quickly at meals, Hemorrhoids, Indigestion, Nausea, Rectal Pain and Vomiting. Male Genitourinary Present- Frequency. Not Present- Blood in Urine, Change in Urinary Stream, Impotence, Nocturia, Painful Urination, Urgency and Urine Leakage. Musculoskeletal Not Present- Back Pain, Joint Pain, Joint Stiffness, Muscle Pain, Muscle Weakness and Swelling of Extremities. Neurological Present- Decreased Memory and Weakness. Not Present- Fainting, Headaches, Numbness, Seizures, Tingling, Tremor and Trouble walking. Psychiatric Present- Anxiety and Change in Sleep Pattern. Not Present- Bipolar, Depression, Fearful and Frequent crying. Endocrine Present- Cold Intolerance. Not Present- Excessive Hunger, Hair Changes, Heat Intolerance and New Diabetes. Hematology Present- Blood Thinners. Not Present- Easy Bruising, Excessive bleeding, Gland problems, HIV and Persistent Infections.  Vitals  Weight: 202.5 lb Height: 70in Body Surface Area: 2.1 m Body Mass Index: 29.06 kg/m  Temp.: 97.85F  Pulse: 90 (Regular)  BP: 126/88 (Sitting, Left Arm, Standard)       Physical Exam  General Mental Status-Alert. General Appearance-Consistent with stated age. Hydration-Well hydrated. Voice-Normal.  Head and Neck Head-normocephalic, atraumatic with no lesions or palpable masses. Trachea-midline. Thyroid Gland Characteristics - normal size and consistency.  Eye Eyeball - Bilateral-Extraocular movements intact. Sclera/Conjunctiva - Bilateral-No scleral icterus.  Chest and Lung Exam Chest and lung exam reveals -quiet, even and easy respiratory effort with no use of accessory muscles and on auscultation, normal breath sounds, no adventitious sounds and normal vocal resonance. Inspection Chest Wall - Normal. Back - normal.  Cardiovascular Cardiovascular examination reveals  -normal heart sounds, regular rate and rhythm with no murmurs and normal pedal pulses bilaterally.  Abdomen Note: The abdomen is soft and nontender. The midline incision has healed nicely with no sign of infection. There is a small fascial defect that is palpable with a bulge at the upper portion of the incision consistent with a hernia. The hernia reduces easily.  Neurologic Neurologic evaluation reveals -alert and oriented x 3 with no impairment of recent or remote memory. Mental Status-Normal.  Musculoskeletal Normal Exam - Left-Upper Extremity Strength Normal and Lower Extremity Strength Normal. Normal Exam - Right-Upper Extremity Strength Normal and Lower Extremity Strength Normal.  Lymphatic Head & Neck  General Head & Neck Lymphatics: Bilateral - Description - Normal. Axillary  General Axillary Region: Bilateral - Description - Normal. Tenderness - Non Tender. Femoral & Inguinal  Generalized Femoral & Inguinal Lymphatics: Bilateral - Description - Normal. Tenderness - Non Tender.    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 His course has been complicated by the development of a ventral hernia. He is scheduled to have this fixed on January 2. He was concerned that his hernia was getting worse but on exam today his abdomen is soft and the hernia reduces easily. He will keep his appointment on January 2 for surgery. He agrees to call me if anything changes in the meantime. Current Plans Instructed to keep follow-up appointment as scheduled

## 2017-02-28 NOTE — Progress Notes (Signed)
IV benadryl administered and new IV fluids hung and running. Patient currently resting. Will continue to monitor.

## 2017-02-28 NOTE — Anesthesia Postprocedure Evaluation (Signed)
Anesthesia Post Note  Patient: Bradley Short  Procedure(s) Performed: LAPAROSCOPIC VENTRAL HERNIA REPAIR WITH MESH (N/A Abdomen) INSERTION OF MESH (N/A Abdomen)     Patient location during evaluation: PACU Anesthesia Type: General Level of consciousness: awake Pain management: pain level controlled Vital Signs Assessment: post-procedure vital signs reviewed and stable Respiratory status: spontaneous breathing, nonlabored ventilation, respiratory function stable and patient connected to nasal cannula oxygen Cardiovascular status: blood pressure returned to baseline and stable Postop Assessment: no apparent nausea or vomiting Anesthetic complications: no    Last Vitals:  Vitals:   02/28/17 1152 02/28/17 1207  BP: 130/65 (!) 144/62  Pulse: 63 66  Resp: 16 18  Temp:    SpO2: 98% 100%    Last Pain:  Vitals:   02/28/17 1205  TempSrc:   PainSc: 0-No pain                 Barnet Glasgow

## 2017-03-01 ENCOUNTER — Encounter (HOSPITAL_COMMUNITY): Payer: Self-pay | Admitting: General Surgery

## 2017-03-01 LAB — BASIC METABOLIC PANEL
Anion gap: 7 (ref 5–15)
BUN: 16 mg/dL (ref 6–20)
CO2: 25 mmol/L (ref 22–32)
CREATININE: 1.59 mg/dL — AB (ref 0.61–1.24)
Calcium: 8.3 mg/dL — ABNORMAL LOW (ref 8.9–10.3)
Chloride: 100 mmol/L — ABNORMAL LOW (ref 101–111)
GFR calc Af Amer: 45 mL/min — ABNORMAL LOW (ref >60)
GFR, EST NON AFRICAN AMERICAN: 39 mL/min — AB (ref >60)
GLUCOSE: 127 mg/dL — AB (ref 65–99)
Potassium: 4.7 mmol/L (ref 3.5–5.1)
SODIUM: 132 mmol/L — AB (ref 135–145)

## 2017-03-01 LAB — CBC
HCT: 44 % (ref 39.0–52.0)
Hemoglobin: 14.3 g/dL (ref 13.0–17.0)
MCH: 30.1 pg (ref 26.0–34.0)
MCHC: 32.5 g/dL (ref 30.0–36.0)
MCV: 92.6 fL (ref 78.0–100.0)
PLATELETS: 142 10*3/uL — AB (ref 150–400)
RBC: 4.75 MIL/uL (ref 4.22–5.81)
RDW: 17.2 % — AB (ref 11.5–15.5)
WBC: 22.1 10*3/uL — AB (ref 4.0–10.5)

## 2017-03-01 MED ORDER — ACETAMINOPHEN 325 MG PO TABS
650.0000 mg | ORAL_TABLET | ORAL | Status: DC | PRN
  Administered 2017-03-01 – 2017-03-02 (×6): 650 mg via ORAL
  Filled 2017-03-01 (×6): qty 2

## 2017-03-01 MED ORDER — MENTHOL 3 MG MT LOZG
1.0000 | LOZENGE | OROMUCOSAL | Status: DC | PRN
  Administered 2017-03-01: 3 mg via ORAL
  Filled 2017-03-01: qty 9

## 2017-03-01 MED ORDER — METHYLPREDNISOLONE SODIUM SUCC 125 MG IJ SOLR
60.0000 mg | Freq: Once | INTRAMUSCULAR | Status: AC
  Administered 2017-03-01: 60 mg via INTRAVENOUS
  Filled 2017-03-01: qty 2

## 2017-03-01 NOTE — Progress Notes (Signed)
Patient started developing rash/allergic reaction last night MD was notified, has been receiving benadryl q6h since then.He received dose around 630 this morning, but now is starting to get red from head down to chest and some welts have come up. Notified MD Toth,await new orders.

## 2017-03-01 NOTE — Progress Notes (Signed)
1 Day Post-Op   Subjective/Chief Complaint: Complains of a rash along chest that started last night   Objective: Vital signs in last 24 hours: Temp:  [97.8 F (36.6 C)-98.4 F (36.9 C)] 98.1 F (36.7 C) (01/03 0951) Pulse Rate:  [65-75] 75 (01/03 0951) Resp:  [16-18] 16 (01/03 0520) BP: (98-110)/(49-63) 98/63 (01/03 0951) SpO2:  [97 %-100 %] 98 % (01/03 0951) Weight:  [91.2 kg (201 lb)] 91.2 kg (201 lb) (01/02 2138) Last BM Date: 02/28/17  Intake/Output from previous day: 01/02 0701 - 01/03 0700 In: 1871.7 [P.O.:50; I.V.:1746.7] Out: 1280 [Urine:1225; Drains:5; Blood:50] Intake/Output this shift: Total I/O In: 280 [P.O.:280] Out: -   General appearance: alert and cooperative Resp: clear to auscultation bilaterally Cardio: regular rate and rhythm GI: soft, appropriately tender. Skin: red rash along chest  Lab Results:  No results for input(s): WBC, HGB, HCT, PLT in the last 72 hours. BMET No results for input(s): NA, K, CL, CO2, GLUCOSE, BUN, CREATININE, CALCIUM in the last 72 hours. PT/INR No results for input(s): LABPROT, INR in the last 72 hours. ABG No results for input(s): PHART, HCO3 in the last 72 hours.  Invalid input(s): PCO2, PO2  Studies/Results: No results found.  Anti-infectives: Anti-infectives (From admission, onward)   Start     Dose/Rate Route Frequency Ordered Stop   02/28/17 0718  ceFAZolin (ANCEF) IVPB 2g/100 mL premix     2 g 200 mL/hr over 30 Minutes Intravenous On call to O.R. 02/28/17 [id number] 02/28/17 0850      Assessment/Plan: s/p Procedure(s): LAPAROSCOPIC VENTRAL HERNIA REPAIR WITH MESH (N/A) INSERTION OF MESH (N/A) Advance diet  Will stop muscle relaxer which he got last night at about the same time as the rash. No rash in OR during the case. Benadryl. Will add a dose of solumedrol Ambulate. Will ask pharmacy to review meds from this admission and last to look for common meds  LOS: 1 day    TOTH III,PAUL S 03/01/2017

## 2017-03-01 NOTE — Discharge Instructions (Signed)

## 2017-03-02 ENCOUNTER — Encounter: Payer: Medicare Other | Admitting: Internal Medicine

## 2017-03-02 LAB — CUP PACEART REMOTE DEVICE CHECK
Date Time Interrogation Session: 20181224143838
MDC IDC PG IMPLANT DT: 20180925

## 2017-03-02 MED ORDER — ACETAMINOPHEN 325 MG PO TABS
650.0000 mg | ORAL_TABLET | ORAL | Status: DC | PRN
  Administered 2017-03-03 (×3): 650 mg via ORAL
  Filled 2017-03-02 (×3): qty 2

## 2017-03-02 MED ORDER — PANTOPRAZOLE SODIUM 40 MG PO TBEC
40.0000 mg | DELAYED_RELEASE_TABLET | Freq: Every day | ORAL | Status: DC
  Administered 2017-03-02 – 2017-03-04 (×3): 40 mg via ORAL
  Filled 2017-03-02 (×3): qty 1

## 2017-03-02 MED ORDER — CLONAZEPAM 0.5 MG PO TABS
0.5000 mg | ORAL_TABLET | Freq: Every evening | ORAL | Status: DC | PRN
  Administered 2017-03-02: 0.5 mg via ORAL
  Filled 2017-03-02: qty 1

## 2017-03-02 NOTE — Progress Notes (Signed)
2 Days Post-Op   Subjective/Chief Complaint: Feels better. Passing lots of flatus   Objective: Vital signs in last 24 hours: Temp:  [97.8 F (36.6 C)-98.9 F (37.2 C)] 98 F (36.7 C) (01/04 0506) Pulse Rate:  [65-80] 80 (01/04 0506) Resp:  [16] 16 (01/04 0506) BP: (96-110)/(54-80) 109/80 (01/04 0506) SpO2:  [93 %-98 %] 95 % (01/04 0506) Last BM Date: 02/28/17  Intake/Output from previous day: 01/03 0701 - 01/04 0700 In: 15 [P.O.:880] Out: 216 [Urine:200; Drains:16] Intake/Output this shift: Total I/O In: 220 [P.O.:220] Out: 325 [Urine:325]  General appearance: alert and cooperative Resp: clear to auscultation bilaterally Cardio: regular rate and rhythm GI: soft, mild tenderness Skin: rash much improved  Lab Results:  Recent Labs    03/01/17 1138  WBC 22.1*  HGB 14.3  HCT 44.0  PLT 142*   BMET Recent Labs    03/01/17 1138  NA 132*  K 4.7  CL 100*  CO2 25  GLUCOSE 127*  BUN 16  CREATININE 1.59*  CALCIUM 8.3*   PT/INR No results for input(s): LABPROT, INR in the last 72 hours. ABG No results for input(s): PHART, HCO3 in the last 72 hours.  Invalid input(s): PCO2, PO2  Studies/Results: No results found.  Anti-infectives: Anti-infectives (From admission, onward)   Start     Dose/Rate Route Frequency Ordered Stop   02/28/17 0718  ceFAZolin (ANCEF) IVPB 2g/100 mL premix     2 g 200 mL/hr over 30 Minutes Intravenous On call to O.R. 02/28/17 1610 02/28/17 0850      Assessment/Plan: s/p Procedure(s): LAPAROSCOPIC VENTRAL HERNIA REPAIR WITH MESH (N/A) INSERTION OF MESH (N/A) Advance diet  Start PT. Ambulate Rash improved. Would not give any more steroid for now  LOS: 2 days    TOTH Short,Bradley Fulp S 03/02/2017

## 2017-03-03 LAB — CBC WITH DIFFERENTIAL/PLATELET
Basophils Absolute: 0 10*3/uL (ref 0.0–0.1)
Basophils Relative: 0 %
Eosinophils Absolute: 0.2 10*3/uL (ref 0.0–0.7)
Eosinophils Relative: 1 %
HEMATOCRIT: 42.7 % (ref 39.0–52.0)
HEMOGLOBIN: 14.3 g/dL (ref 13.0–17.0)
LYMPHS PCT: 9 %
Lymphs Abs: 1.3 10*3/uL (ref 0.7–4.0)
MCH: 30.4 pg (ref 26.0–34.0)
MCHC: 33.5 g/dL (ref 30.0–36.0)
MCV: 90.7 fL (ref 78.0–100.0)
MONO ABS: 0.5 10*3/uL (ref 0.1–1.0)
MONOS PCT: 4 %
NEUTROS ABS: 12.8 10*3/uL — AB (ref 1.7–7.7)
NEUTROS PCT: 86 %
Platelets: 132 10*3/uL — ABNORMAL LOW (ref 150–400)
RBC: 4.71 MIL/uL (ref 4.22–5.81)
RDW: 16.9 % — AB (ref 11.5–15.5)
WBC: 14.8 10*3/uL — ABNORMAL HIGH (ref 4.0–10.5)

## 2017-03-03 LAB — BASIC METABOLIC PANEL
Anion gap: 8 (ref 5–15)
BUN: 20 mg/dL (ref 6–20)
CALCIUM: 8.5 mg/dL — AB (ref 8.9–10.3)
CHLORIDE: 101 mmol/L (ref 101–111)
CO2: 26 mmol/L (ref 22–32)
CREATININE: 1.43 mg/dL — AB (ref 0.61–1.24)
GFR calc non Af Amer: 44 mL/min — ABNORMAL LOW (ref >60)
GFR, EST AFRICAN AMERICAN: 51 mL/min — AB (ref >60)
GLUCOSE: 124 mg/dL — AB (ref 65–99)
Potassium: 4.3 mmol/L (ref 3.5–5.1)
Sodium: 135 mmol/L (ref 135–145)

## 2017-03-03 MED ORDER — MELATONIN 3 MG PO TABS
3.0000 mg | ORAL_TABLET | Freq: Every day | ORAL | Status: AC
  Administered 2017-03-03: 3 mg via ORAL
  Filled 2017-03-03: qty 1

## 2017-03-03 MED ORDER — ZOLPIDEM TARTRATE 5 MG PO TABS
5.0000 mg | ORAL_TABLET | Freq: Every evening | ORAL | Status: DC | PRN
Start: 2017-03-03 — End: 2017-03-04

## 2017-03-03 NOTE — Progress Notes (Signed)
3 Days Post-Op  Subjective: Stable and alert.  Pain well controlled.  Tolerating diet.  Voiding well. Patient concerned about mobility.  Hopefully to start working with PT today Wife worried about anxiety.  She asked if we should put him on Xanax. Sounds like the biggest problem is sleep deprivation so we will going to try a low-dose of Ambien tonight at bedtime  Rash is improved  Although clinically stable they are very anxious and want to wait until tomorrow to go home I think that is reasonable until we see how he does with physical therapy  He had blood work this morning showing creatinine 1.43.  Potassium 4.3.  Hemoglobin 14.3.  WBC down to 14.8.  Objective: Vital signs in last 24 hours: Temp:  [97.9 F (36.6 C)-98.2 F (36.8 C)] 97.9 F (36.6 C) (01/05 0458) Pulse Rate:  [79-146] 116 (01/05 0458) Resp:  [16-18] 18 (01/05 0458) BP: (100-110)/(61-78) 100/72 (01/05 0458) SpO2:  [95 %-96 %] 96 % (01/05 0458) Last BM Date: 03/02/17  Intake/Output from previous day: 01/04 0701 - 01/05 0700 In: 760 [P.O.:760] Out: 637 [Urine:625; Drains:12] Intake/Output this shift: No intake/output data recorded.  General appearance:  Alert and cooperative.  Some anxiety but otherwise very appropriate and friendly.  No distress Resp: clear to auscultation bilaterally GI: Abdomen soft.  Active bowel sounds.  Wound is clean.  JP drain is serosanguineous, 12 cc out overnight.  Lab Results:  Results for orders placed or performed during the hospital encounter of 02/28/17 (from the past 24 hour(s))  CBC with Differential/Platelet     Status: Abnormal   Collection Time: 03/03/17  4:45 AM  Result Value Ref Range   WBC 14.8 (H) 4.0 - 10.5 K/uL   RBC 4.71 4.22 - 5.81 MIL/uL   Hemoglobin 14.3 13.0 - 17.0 g/dL   HCT 42.7 39.0 - 52.0 %   MCV 90.7 78.0 - 100.0 fL   MCH 30.4 26.0 - 34.0 pg   MCHC 33.5 30.0 - 36.0 g/dL   RDW 16.9 (H) 11.5 - 15.5 %   Platelets 132 (L) 150 - 400 K/uL   Neutrophils  Relative % 86 %   Neutro Abs 12.8 (H) 1.7 - 7.7 K/uL   Lymphocytes Relative 9 %   Lymphs Abs 1.3 0.7 - 4.0 K/uL   Monocytes Relative 4 %   Monocytes Absolute 0.5 0.1 - 1.0 K/uL   Eosinophils Relative 1 %   Eosinophils Absolute 0.2 0.0 - 0.7 K/uL   Basophils Relative 0 %   Basophils Absolute 0.0 0.0 - 0.1 K/uL  Basic metabolic panel     Status: Abnormal   Collection Time: 03/03/17  4:45 AM  Result Value Ref Range   Sodium 135 135 - 145 mmol/L   Potassium 4.3 3.5 - 5.1 mmol/L   Chloride 101 101 - 111 mmol/L   CO2 26 22 - 32 mmol/L   Glucose, Bld 124 (H) 65 - 99 mg/dL   BUN 20 6 - 20 mg/dL   Creatinine, Ser 1.43 (H) 0.61 - 1.24 mg/dL   Calcium 8.5 (L) 8.9 - 10.3 mg/dL   GFR calc non Af Amer 44 (L) >60 mL/min   GFR calc Af Amer 51 (L) >60 mL/min   Anion gap 8 5 - 15     Studies/Results: No results found.  Marland Kitchen allopurinol  100 mg Oral Daily  . pantoprazole  40 mg Oral QHS  . rivaroxaban  20 mg Oral Q supper     Assessment/Plan: s/p  Procedure(s): LAPAROSCOPIC VENTRAL HERNIA REPAIR WITH MESH INSERTION OF MESH  POD #3.  Laparoscopic ventral hernia repair with mesh.  Stable Work with PT today to see if mobility will improve to where we can safely discharge him tomorrow Continue regular diet Ambien 5 mg at bedtime for sleep    @PROBHOSP @  LOS: 3 days    Adin Hector 03/03/2017  . .prob

## 2017-03-03 NOTE — Evaluation (Addendum)
Physical Therapy Evaluation Patient Details Name: Bradley Short MRN: [id number] DOB: Jan 04, 1933 Today's Date: 03/03/2017   History of Present Illness  Pt is an 84 y.o. male who presented for a follow-up for abdominal pain. He is about 5 months s/p R colectomy for an adenoma with dysplasia.  He tolerated the surgery reasonably well.  Unfortunately he also developed a ventral hernia.  He underwent hernia repair 02/29/16.   Clinical Impression  Pt admitted with above diagnosis. Pt currently with functional limitations due to the deficits listed below (see PT Problem List). On eval, pt required min guard assist for bed mobility, transfers and ambulation 250 feet with RW.  He has one step to enter his house.  Pt will benefit from skilled PT to increase their independence and safety with mobility to allow discharge to the venue listed below.       Follow Up Recommendations Home health PT;Supervision/Assistance - 24 hour    Equipment Recommendations  None recommended by PT    Recommendations for Other Services       Precautions / Restrictions Precautions Precautions: None      Mobility  Bed Mobility Overal bed mobility: Needs Assistance Bed Mobility: Rolling;Sidelying to Sit;Sit to Sidelying Rolling: Modified independent (Device/Increase time) Sidelying to sit: Min guard;HOB elevated     Sit to sidelying: Min guard;HOB elevated General bed mobility comments: +rail, verbal cues for sequencing, increased time and effort  Transfers Overall transfer level: Needs assistance Equipment used: Rolling walker (2 wheeled) Transfers: Sit to/from Stand Sit to Stand: Min guard         General transfer comment: verbal cues for hand placement, increased time and effort  Ambulation/Gait Ambulation/Gait assistance: Min guard Ambulation Distance (Feet): 250 Feet Assistive device: Rolling walker (2 wheeled) Gait Pattern/deviations: Step-through pattern;Decreased stride length Gait velocity:  WFL Gait velocity interpretation: at or above normal speed for age/gender General Gait Details: steady gait  Stairs            Wheelchair Mobility    Modified Rankin (Stroke Patients Only)       Balance Overall balance assessment: Needs assistance Sitting-balance support: No upper extremity supported;Feet supported Sitting balance-Leahy Scale: Good     Standing balance support: Bilateral upper extremity supported;During functional activity Standing balance-Leahy Scale: Fair Standing balance comment: RW for ambulation                             Pertinent Vitals/Pain Pain Assessment: Faces Faces Pain Scale: Hurts little more Pain Location: abdomen Pain Descriptors / Indicators: Grimacing;Guarding;Sore Pain Intervention(s): Limited activity within patient's tolerance;Monitored during session    [place] expects to be discharged to:: Private residence Living Arrangements: Spouse/significant other Available Help at Discharge: Family;Available 24 hours/day Type of Home: House Home Access: Stairs to enter   [employer] of Steps: 1 Home Layout: One level Home Equipment: Walker - 2 wheels;Bedside commode      Prior Function Level of Independence: Independent               Hand Dominance        Extremity/Trunk Assessment   Upper Extremity Assessment Upper Extremity Assessment: Overall WFL for tasks assessed    Lower Extremity Assessment Lower Extremity Assessment: Overall WFL for tasks assessed    Cervical / Trunk Assessment Cervical / Trunk Assessment: Normal  Communication   Communication: No difficulties  Cognition Arousal/Alertness: Awake/alert Behavior During Therapy: WFL for tasks assessed/performed Overall Cognitive Status: Within  Functional Limits for tasks assessed                                 General Comments: Pt is very regimented and needs to do things in a specific order/way.        General Comments      Exercises     Assessment/Plan    PT Assessment Patient needs continued PT services  PT Problem List Decreased mobility;Decreased activity tolerance;Pain;Decreased balance       PT Treatment Interventions DME instruction;Therapeutic activities;Gait training;Therapeutic exercise;Patient/family education;Stair training;Balance training;Functional mobility training    PT Goals (Current goals can be found in the Care Plan section)  Acute Rehab PT Goals Patient Stated Goal: home tomorrow PT Goal Formulation: With patient/family Time For Goal Achievement: 03/10/17 Potential to Achieve Goals: Good    Frequency Min 3X/week   Barriers to discharge        Co-evaluation               AM-PAC PT "6 Clicks" Daily Activity  Outcome Measure Difficulty turning over in bed (including adjusting bedclothes, sheets and blankets)?: A Little Difficulty moving from lying on back to sitting on the side of the bed? : A Lot Difficulty sitting down on and standing up from a chair with arms (e.g., wheelchair, bedside commode, etc,.)?: A Little Help needed moving to and from a bed to chair (including a wheelchair)?: None Help needed walking in hospital room?: A Little Help needed climbing 3-5 steps with a railing? : A Little 6 Click Score: 18    End of Session Equipment Utilized During Treatment: Gait belt Activity Tolerance: Patient tolerated treatment well Patient left: in bed;with call bell/phone within reach;with family/visitor present Nurse Communication: Mobility status PT Visit Diagnosis: Difficulty in walking, not elsewhere classified (R26.2);Pain    Time: 0737-1062 PT Time Calculation (min) (ACUTE ONLY): 32 min   Charges:   PT Evaluation $PT Eval Low Complexity: 1 Low PT Treatments $Gait Training: 8-22 mins   PT G Codes:        Lorrin Goodell, PT  Office # [phone] Pager [phone]   Lorriane Shire 03/03/2017, 3:48 PM

## 2017-03-04 MED ORDER — DIPHENHYDRAMINE HCL 25 MG PO CAPS
25.0000 mg | ORAL_CAPSULE | Freq: Every evening | ORAL | Status: DC | PRN
  Administered 2017-03-04: 25 mg via ORAL
  Filled 2017-03-04: qty 1

## 2017-03-04 MED ORDER — ACETAMINOPHEN 325 MG PO TABS
650.0000 mg | ORAL_TABLET | ORAL | Status: DC | PRN
  Administered 2017-03-04 – 2017-03-05 (×3): 650 mg via ORAL
  Filled 2017-03-04 (×3): qty 2

## 2017-03-04 NOTE — Progress Notes (Signed)
Physical Therapy Treatment Patient Details Name: Bradley Short MRN: [id number] DOB: 1932-03-13 Today's Date: 03/04/2017    History of Present Illness Pt is an 84 y.o. male who presented for a follow-up for abdominal pain. He is about 5 months s/p R colectomy for an adenoma with dysplasia.  He tolerated the surgery reasonably well.  Unfortunately he also developed a ventral hernia.  He underwent hernia repair 02/29/16.     PT Comments    Continuing work on functional mobility and activity tolerance;  Very anxious with mobiltiy and walking, but able to calm a bit with redirection and focused breathing; O2 sats 97% on Room Air   Follow Up Recommendations  Home health PT;Supervision/Assistance - 24 hour     Equipment Recommendations  None recommended by PT    Recommendations for Other Services       Precautions / Restrictions Precautions Precautions: None    Mobility  Bed Mobility                  Transfers Overall transfer level: Needs assistance Equipment used: Rolling walker (2 wheeled) Transfers: Sit to/from Stand Sit to Stand: Min guard         General transfer comment: verbal cues for hand placement, increased time and effort  Ambulation/Gait Ambulation/Gait assistance: Min guard Ambulation Distance (Feet): 550 Feet(greater than) Assistive device: Rolling walker (2 wheeled) Gait Pattern/deviations: Step-through pattern;Decreased stride length     General Gait Details: steady gait; very anxious; cues for controlled breathing   Stairs            Wheelchair Mobility    Modified Rankin (Stroke Patients Only)       Balance                                            Cognition Arousal/Alertness: Awake/alert Behavior During Therapy: WFL for tasks assessed/performed Overall Cognitive Status: Within Functional Limits for tasks assessed                                 General Comments: Pt is very regimented and  needs to do things in a specific order/way. Highly anxious with mobiltiy      Exercises      General Comments        Pertinent Vitals/Pain Pain Assessment: No/denies pain Faces Pain Scale: Hurts a little bit Pain Location: abdomen Pain Descriptors / Indicators: Grimacing;Guarding;Sore Pain Intervention(s): Monitored during session    Home Living                      Prior Function            PT Goals (current goals can now be found in the care plan section) Acute Rehab PT Goals Patient Stated Goal: to walk PT Goal Formulation: With patient/family Time For Goal Achievement: 03/10/17 Potential to Achieve Goals: Good Progress towards PT goals: Progressing toward goals    Frequency    Min 3X/week      PT Plan Current plan remains appropriate    Co-evaluation              AM-PAC PT "6 Clicks" Daily Activity  Outcome Measure  Difficulty turning over in bed (including adjusting bedclothes, sheets and blankets)?: A Little Difficulty moving from lying on back to sitting on  the side of the bed? : A Lot Difficulty sitting down on and standing up from a chair with arms (e.g., wheelchair, bedside commode, etc,.)?: A Little Help needed moving to and from a bed to chair (including a wheelchair)?: None Help needed walking in hospital room?: A Little Help needed climbing 3-5 steps with a railing? : A Little 6 Click Score: 18    End of Session   Activity Tolerance: Patient tolerated treatment well Patient left: in chair;with call bell/phone within reach;with family/visitor present Nurse Communication: Mobility status PT Visit Diagnosis: Difficulty in walking, not elsewhere classified (R26.2);Pain     Time: 4158-3094 PT Time Calculation (min) (ACUTE ONLY): 32 min  Charges:  $Gait Training: 23-37 mins                    G Codes:       Roney Marion, [place] Pager [phone] Office [place] 03/04/2017,  5:59 PM

## 2017-03-04 NOTE — Progress Notes (Addendum)
4 Days Post-Op  Subjective: Slept about 3 hours last night. He and his wife think that he is just not thinking properly.  Worried about confusion and sleep deprivation  He is doing well with diet.  Having bowel movements.  Voiding without difficulty. Ambulated in hall with physical therapy yesterday PT recommends home health PT 3 times a week  Vital signs are fine.  Afebrile.  Heart rate 73.  BP 124/63.  SPO2 98% on room air. Lab work looks fine.  WBC down to 14,000.  Hemoglobin stable.  Electrolytes normal.  Creatinine 1.43 slightly elevated  Long talk with patient and wife about mental status concerns. He has not taken much narcotic or sedative over the past 12 hours.  We are going to simply withhold all narcotic and sedative and use Tylenol for pain.  Objective: Vital signs in last 24 hours: Temp:  [97.8 F (36.6 C)-98.7 F (37.1 C)] 97.8 F (36.6 C) (01/06 0604) Pulse Rate:  [69-96] 73 (01/06 0604) Resp:  [18] 18 (01/06 0604) BP: (99-124)/(63-70) 124/63 (01/06 0604) SpO2:  [98 %-100 %] 98 % (01/06 0604) Last BM Date: 03/03/17  Intake/Output from previous day: 01/05 0701 - 01/06 0700 In: 540 [P.O.:540] Out: 12 [Drains:12] Intake/Output this shift: Total I/O In: 180 [P.O.:180] Out: 5 [Drains:5]  General appearance: Alert.  Oriented to place and situation.  A little grumpy.  No acute distress Resp: clear to auscultation bilaterally GI: Abdomen soft.  Bowel sounds present.  Wounds look good. Incision/Wound: Wounds are clean.  JP drain is 12 cc.  Lab Results:  No results found for this or any previous visit (from the past 24 hour(s)).   Studies/Results: No results found.  Marland Kitchen allopurinol  100 mg Oral Daily  . pantoprazole  40 mg Oral QHS  . rivaroxaban  20 mg Oral Q supper     Assessment/Plan: s/p Procedure(s): LAPAROSCOPIC VENTRAL HERNIA REPAIR WITH MESH INSERTION OF MESH  POD #4.  Laparoscopic ventral hernia repair with mesh.  Stable Doing well from surgical  standpoint Has some mobility problems.  Continue to work with PT.  Will need HH PT  Anxiety and mental status concerns.  He does not seem encephalopathic.  At most he may have a little sleep deprivation superimposed on chronic anxiety .   after discussing this with the patient, his wife, and his nurse were simply going to withhold all narcotics and sedatives such as Klonopin(which he took on Friday but not on Saturday), Ambien(which he did not take), and narcotics and use Tylenol for pain and see how he does Added benedryl prn sleep at wife's request.  Dr. Marlou Starks to follow-up on Monday.   @PROBHOSP @  LOS: 4 days    Adin Hector 03/04/2017  . .prob

## 2017-03-05 MED ORDER — HYDROCODONE-ACETAMINOPHEN 5-325 MG PO TABS: 1.0000 | ORAL_TABLET | Freq: Four times a day (QID) | ORAL | 0 refills | Status: DC | PRN

## 2017-03-05 NOTE — Care Management Note (Signed)
Case Management Note  Patient Details  Name: Darrie Macmillan MRN: [id number] Date of Birth: 1932/07/29  Subjective/Objective:                    Action/Plan:  Confirmed face sheet information with patient and wife at bedside. Choice offered. Patient has had [facility] in past and would like [facility] again. Referral given to Neoma Laming with [facility]. Patient already has all DME. Expected Discharge Date:  03/05/17               Expected Discharge Plan:  [place]  In-House Referral:     Discharge planning Services  CM Consult  Post Acute Care Choice:  Home Health Choice offered to:  Patient, Spouse  DME Arranged:  N/A DME Agency:  NA  HH Arranged:  OT [place] Agency:  [facility]  Status of Service:     If discussed at [place] of Stay Meetings, dates discussed:    Additional Comments:  Marilu Favre, RN 03/05/2017, 10:29 AM

## 2017-03-05 NOTE — Discharge Summary (Signed)
Physician Discharge Summary  Patient ID: Bradley Short MRN: [id number] DOB/AGE: 1932/05/27 84 y.o.  Admit date: 02/28/2017 Discharge date: 03/05/2017  Admission Diagnoses:  Discharge Diagnoses:  Active Problems:   Ventral hernia without obstruction or gangrene   Discharged Condition: good  Hospital Course: the pt underwent lap ventral hernia repair with mesh. He tolerated surgery well. On pod 1 he developed a rash requiring a dose of steroid to get under control. He had some issues with somnolence and anxiety but today was feeling better and ready for discharge. Will arrange for some home PT  Consults: None  Significant Diagnostic Studies: none  Treatments: surgery: as above  Discharge Exam: Blood pressure 122/68, pulse 97, temperature 98 F (36.7 C), temperature source Oral, resp. rate 18, height 5\' 10"  (1.778 m), weight 91.2 kg (201 lb), SpO2 97 %. General appearance: alert and cooperative Resp: clear to auscultation bilaterally Cardio: regular rate and rhythm GI: soft, mild tenderness. incisions look good  Disposition: 01-Home or Self Care  Discharge Instructions    Call MD for:  difficulty breathing, headache or visual disturbances   Complete by:  As directed    Call MD for:  extreme fatigue   Complete by:  As directed    Call MD for:  hives   Complete by:  As directed    Call MD for:  persistant dizziness or light-headedness   Complete by:  As directed    Call MD for:  persistant nausea and vomiting   Complete by:  As directed    Call MD for:  redness, tenderness, or signs of infection (pain, swelling, redness, odor or green/yellow discharge around incision site)   Complete by:  As directed    Call MD for:  severe uncontrolled pain   Complete by:  As directed    Call MD for:  temperature >100.4   Complete by:  As directed    Diet - low sodium heart healthy   Complete by:  As directed    Discharge instructions   Complete by:  As directed    May shower. Diet as  tolerated. No heavy lifting   Increase activity slowly   Complete by:  As directed    No wound care   Complete by:  As directed      Allergies as of 03/05/2017      Reactions   Amiodarone    Developed severe drug rash while on Amiodarone and Diltiazem unclear which one of them was culprit   Diltiazem Hcl    REACTION: Looked like burned all over   Lisinopril    REACTION: Looked like burned all over   Metoprolol Succinate    REACTION: Looked like burned all over   Propafenone Hcl    REACTION: Looked like burned all over   Robaxin [methocarbamol]    Rash all over      Medication List    TAKE these medications   acetaminophen 325 MG tablet Commonly known as:  TYLENOL Take 325 mg by mouth every 6 (six) hours as needed for mild pain.   allopurinol 100 MG tablet Commonly known as:  ZYLOPRIM Take 100 mg by mouth daily.   clonazePAM 0.5 MG tablet Commonly known as:  KLONOPIN Take 0.25-0.5 mg by mouth at bedtime as needed for anxiety (depends on anxiety level).   colchicine 0.6 MG tablet Take 0.6 mg by mouth 2 (two) times daily as needed (Gout).   ferrous sulfate 325 (65 FE) MG tablet Take 325 mg by mouth  daily with breakfast.   HYDROcodone-acetaminophen 5-325 MG tablet Commonly known as:  NORCO/VICODIN Take 1-2 tablets by mouth every 6 (six) hours as needed for moderate pain or severe pain.   multivitamin with minerals tablet Take 1 tablet by mouth daily.   rivaroxaban 20 MG Tabs tablet Commonly known as:  XARELTO Take 1 tablet (20 mg total) by mouth daily with supper.      Follow-up Information    Autumn Messing III, MD Follow up in 2 week(s).   Specialty:  General Surgery Contact information: [address] [place] [address] [phone]        [facility] - Dme Follow up.   Why:  will provide home health PT Contact information: [place] [address] [phone]           Signed: Merrie Roof 03/05/2017, 3:46 PM

## 2017-03-05 NOTE — Care Management Important Message (Addendum)
PlImportant Message  Patient Details  Name: Bradley Short MRN: [id number] Date of Birth: 09/01/1932   Medicare Important Message Given:  Yes    Shondale Quinley 03/05/2017, 2:07 PM

## 2017-03-05 NOTE — Care Management Important Message (Signed)
Important Message  Patient Details  Name: Bradley Short MRN: [id number] Date of Birth: 1933-01-05   Medicare Important Message Given:  Yes  Please disregard le Orbie Pyo 03/05/2017, 2:07 PM

## 2017-03-05 NOTE — Progress Notes (Signed)
5 Days Post-Op   Subjective/Chief Complaint: Feels better today. Had a lot of anxiety over weekend   Objective: Vital signs in last 24 hours: Temp:  [97.8 F (36.6 C)-98 F (36.7 C)] 98 F (36.7 C) (01/06 2041) Pulse Rate:  [76-97] 97 (01/06 2041) Resp:  [18] 18 (01/06 2041) BP: (119-122)/(54-68) 122/68 (01/06 2041) SpO2:  [97 %] 97 % (01/06 2041) Last BM Date: 03/04/17  Intake/Output from previous day: 01/06 0701 - 01/07 0700 In: 960 [P.O.:960] Out: 5 [Drains:5] Intake/Output this shift: No intake/output data recorded.  General appearance: alert and cooperative Resp: clear to auscultation bilaterally Cardio: regular rate and rhythm GI: soft, apprpriately tender. having bm's. incisions look good  Lab Results:  Recent Labs    03/03/17 0445  WBC 14.8*  HGB 14.3  HCT 42.7  PLT 132*   BMET Recent Labs    03/03/17 0445  NA 135  K 4.3  CL 101  CO2 26  GLUCOSE 124*  BUN 20  CREATININE 1.43*  CALCIUM 8.5*   PT/INR No results for input(s): LABPROT, INR in the last 72 hours. ABG No results for input(s): PHART, HCO3 in the last 72 hours.  Invalid input(s): PCO2, PO2  Studies/Results: No results found.  Anti-infectives: Anti-infectives (From admission, onward)   Start     Dose/Rate Route Frequency Ordered Stop   02/28/17 0718  ceFAZolin (ANCEF) IVPB 2g/100 mL premix     2 g 200 mL/hr over 30 Minutes Intravenous On call to O.R. 02/28/17 6811 02/28/17 0850      Assessment/Plan: s/p Procedure(s): LAPAROSCOPIC VENTRAL HERNIA REPAIR WITH MESH (N/A) INSERTION OF MESH (N/A) Advance diet  Home health PT Discharge if all can be arranged for today  LOS: 5 days    TOTH III,Haynes Giannotti S 03/05/2017

## 2017-03-07 DIAGNOSIS — Z9181 History of falling: Secondary | ICD-10-CM | POA: Diagnosis not present

## 2017-03-07 DIAGNOSIS — Z48815 Encounter for surgical aftercare following surgery on the digestive system: Secondary | ICD-10-CM | POA: Diagnosis not present

## 2017-03-07 DIAGNOSIS — Z7901 Long term (current) use of anticoagulants: Secondary | ICD-10-CM | POA: Diagnosis not present

## 2017-03-07 DIAGNOSIS — M1991 Primary osteoarthritis, unspecified site: Secondary | ICD-10-CM | POA: Diagnosis not present

## 2017-03-07 DIAGNOSIS — M549 Dorsalgia, unspecified: Secondary | ICD-10-CM | POA: Diagnosis not present

## 2017-03-07 DIAGNOSIS — G40909 Epilepsy, unspecified, not intractable, without status epilepticus: Secondary | ICD-10-CM | POA: Diagnosis not present

## 2017-03-07 DIAGNOSIS — F419 Anxiety disorder, unspecified: Secondary | ICD-10-CM | POA: Diagnosis not present

## 2017-03-09 DIAGNOSIS — Z7901 Long term (current) use of anticoagulants: Secondary | ICD-10-CM | POA: Diagnosis not present

## 2017-03-09 DIAGNOSIS — M549 Dorsalgia, unspecified: Secondary | ICD-10-CM | POA: Diagnosis not present

## 2017-03-09 DIAGNOSIS — G40909 Epilepsy, unspecified, not intractable, without status epilepticus: Secondary | ICD-10-CM | POA: Diagnosis not present

## 2017-03-09 DIAGNOSIS — M1991 Primary osteoarthritis, unspecified site: Secondary | ICD-10-CM | POA: Diagnosis not present

## 2017-03-09 DIAGNOSIS — Z48815 Encounter for surgical aftercare following surgery on the digestive system: Secondary | ICD-10-CM | POA: Diagnosis not present

## 2017-03-09 DIAGNOSIS — F419 Anxiety disorder, unspecified: Secondary | ICD-10-CM | POA: Diagnosis not present

## 2017-03-12 DIAGNOSIS — M549 Dorsalgia, unspecified: Secondary | ICD-10-CM | POA: Diagnosis not present

## 2017-03-12 DIAGNOSIS — Z7901 Long term (current) use of anticoagulants: Secondary | ICD-10-CM | POA: Diagnosis not present

## 2017-03-12 DIAGNOSIS — G40909 Epilepsy, unspecified, not intractable, without status epilepticus: Secondary | ICD-10-CM | POA: Diagnosis not present

## 2017-03-12 DIAGNOSIS — F419 Anxiety disorder, unspecified: Secondary | ICD-10-CM | POA: Diagnosis not present

## 2017-03-12 DIAGNOSIS — Z48815 Encounter for surgical aftercare following surgery on the digestive system: Secondary | ICD-10-CM | POA: Diagnosis not present

## 2017-03-12 DIAGNOSIS — M1991 Primary osteoarthritis, unspecified site: Secondary | ICD-10-CM | POA: Diagnosis not present

## 2017-03-14 DIAGNOSIS — M1991 Primary osteoarthritis, unspecified site: Secondary | ICD-10-CM | POA: Diagnosis not present

## 2017-03-14 DIAGNOSIS — Z48815 Encounter for surgical aftercare following surgery on the digestive system: Secondary | ICD-10-CM | POA: Diagnosis not present

## 2017-03-14 DIAGNOSIS — G40909 Epilepsy, unspecified, not intractable, without status epilepticus: Secondary | ICD-10-CM | POA: Diagnosis not present

## 2017-03-14 DIAGNOSIS — F419 Anxiety disorder, unspecified: Secondary | ICD-10-CM | POA: Diagnosis not present

## 2017-03-14 DIAGNOSIS — Z7901 Long term (current) use of anticoagulants: Secondary | ICD-10-CM | POA: Diagnosis not present

## 2017-03-14 DIAGNOSIS — M549 Dorsalgia, unspecified: Secondary | ICD-10-CM | POA: Diagnosis not present

## 2017-03-21 ENCOUNTER — Ambulatory Visit (INDEPENDENT_AMBULATORY_CARE_PROVIDER_SITE_OTHER): Payer: Medicare Other | Admitting: *Deleted

## 2017-03-21 DIAGNOSIS — M1991 Primary osteoarthritis, unspecified site: Secondary | ICD-10-CM | POA: Diagnosis not present

## 2017-03-21 DIAGNOSIS — I480 Paroxysmal atrial fibrillation: Secondary | ICD-10-CM | POA: Diagnosis not present

## 2017-03-21 DIAGNOSIS — Z7901 Long term (current) use of anticoagulants: Secondary | ICD-10-CM | POA: Diagnosis not present

## 2017-03-21 DIAGNOSIS — F419 Anxiety disorder, unspecified: Secondary | ICD-10-CM | POA: Diagnosis not present

## 2017-03-21 DIAGNOSIS — I482 Chronic atrial fibrillation: Secondary | ICD-10-CM | POA: Diagnosis not present

## 2017-03-21 DIAGNOSIS — R1033 Periumbilical pain: Secondary | ICD-10-CM | POA: Diagnosis not present

## 2017-03-21 DIAGNOSIS — G40909 Epilepsy, unspecified, not intractable, without status epilepticus: Secondary | ICD-10-CM | POA: Diagnosis not present

## 2017-03-21 DIAGNOSIS — Z48815 Encounter for surgical aftercare following surgery on the digestive system: Secondary | ICD-10-CM | POA: Diagnosis not present

## 2017-03-21 DIAGNOSIS — Z6827 Body mass index (BMI) 27.0-27.9, adult: Secondary | ICD-10-CM | POA: Diagnosis not present

## 2017-03-21 DIAGNOSIS — M549 Dorsalgia, unspecified: Secondary | ICD-10-CM | POA: Diagnosis not present

## 2017-03-21 NOTE — Progress Notes (Signed)
Carelink Summary Report / Loop Recorder 

## 2017-03-23 DIAGNOSIS — M1991 Primary osteoarthritis, unspecified site: Secondary | ICD-10-CM | POA: Diagnosis not present

## 2017-03-23 DIAGNOSIS — Z48815 Encounter for surgical aftercare following surgery on the digestive system: Secondary | ICD-10-CM | POA: Diagnosis not present

## 2017-03-23 DIAGNOSIS — F419 Anxiety disorder, unspecified: Secondary | ICD-10-CM | POA: Diagnosis not present

## 2017-03-23 DIAGNOSIS — Z7901 Long term (current) use of anticoagulants: Secondary | ICD-10-CM | POA: Diagnosis not present

## 2017-03-23 DIAGNOSIS — G40909 Epilepsy, unspecified, not intractable, without status epilepticus: Secondary | ICD-10-CM | POA: Diagnosis not present

## 2017-03-23 DIAGNOSIS — M549 Dorsalgia, unspecified: Secondary | ICD-10-CM | POA: Diagnosis not present

## 2017-03-30 ENCOUNTER — Encounter: Payer: Medicare Other | Admitting: Internal Medicine

## 2017-03-30 LAB — CUP PACEART REMOTE DEVICE CHECK
Implantable Pulse Generator Implant Date: 20180925
MDC IDC SESS DTM: 20190123150842

## 2017-04-12 ENCOUNTER — Encounter: Payer: Self-pay | Admitting: Internal Medicine

## 2017-04-12 ENCOUNTER — Ambulatory Visit (INDEPENDENT_AMBULATORY_CARE_PROVIDER_SITE_OTHER): Payer: Medicare Other | Admitting: Internal Medicine

## 2017-04-12 VITALS — BP 124/72 | HR 67 | Ht 70.5 in | Wt 198.0 lb

## 2017-04-12 DIAGNOSIS — I480 Paroxysmal atrial fibrillation: Secondary | ICD-10-CM

## 2017-04-12 DIAGNOSIS — I4892 Unspecified atrial flutter: Secondary | ICD-10-CM

## 2017-04-12 DIAGNOSIS — I10 Essential (primary) hypertension: Secondary | ICD-10-CM

## 2017-04-12 MED ORDER — RIVAROXABAN 20 MG PO TABS: 20.0000 mg | ORAL_TABLET | Freq: Every day | ORAL | 1 refills | Status: DC

## 2017-04-12 NOTE — Progress Notes (Signed)
PCP: Manon Hilding, MD   Primary EP: Dr Teressa Senter is a 84 y.o. male who presents today for routine electrophysiology followup.  Since last being seen in our clinic, the patient reports doing reasonably well.  Recovering from recent hernia repair.  In afib/ atrial flutter but mostly unaware.  Today, he denies symptoms of palpitations, chest pain, shortness of breath,  lower extremity edema, dizziness, presyncope, or syncope.  The patient is otherwise without complaint today.   Past Medical History:  Diagnosis Date  . Arthritis   . Atrial fibrillation (HCC)    paroxysmal, s/p PVI by Dr Rayann Heman 8/12  . Atrial flutter ([place])    s/p CTI ablation  . Cancer [facility])    4th stage dysplasia mass in colon-removed 7/18  . Dysrhythmia    A. fib/a. a. flutter  . Hyperlipidemia   . Obstructive sleep apnea (adult) (pediatric)    does not use cpap   Past Surgical History:  Procedure Laterality Date  . ATRIAL ABLATION SURGERY     s/p CTI and PVI ablations by Dr Rayann Heman 8/12  . CARDIAC CATHETERIZATION     nonobstructive CAD 2000  . COLONOSCOPY WITH PROPOFOL N/A 08/24/2016   Procedure: COLONOSCOPY WITH PROPOFOL;  Surgeon: Teena Irani, MD;  Location: [facility];  Service: Endoscopy;  Laterality: N/A;  . INSERTION OF MESH N/A 02/28/2017   Procedure: INSERTION OF MESH;  Surgeon: Jovita Kussmaul, MD;  Location: [facility];  Service: General;  Laterality: N/A;  . LAPAROSCOPIC RIGHT COLECTOMY Right 08/28/2016   Procedure: LAPAROSCOPIC ASSISTED RIGHT COLECTOMY;  Surgeon: Jovita Kussmaul, MD;  Location: [facility];  Service: General;  Laterality: Right;  . LOOP RECORDER INSERTION N/A 11/21/2016   Procedure: LOOP RECORDER INSERTION;  Surgeon: [name], MD;  Location: [facility];  Service: Cardiovascular;  Laterality: N/A;  . melanoma (other)     resected from his right shoulder  . VENTRAL HERNIA REPAIR  02/28/2017  . VENTRAL HERNIA REPAIR N/A 02/28/2017   Procedure: LAPAROSCOPIC VENTRAL HERNIA  REPAIR WITH MESH;  Surgeon: Jovita Kussmaul, MD;  Location: [facility];  Service: General;  Laterality: N/A;    ROS- all systems are reviewed and negatives except as per HPI above  Current Outpatient Medications  Medication Sig Dispense Refill  . acetaminophen (TYLENOL) 325 MG tablet Take 325 mg by mouth every 6 (six) hours as needed for mild pain.    Marland Kitchen allopurinol (ZYLOPRIM) 100 MG tablet Take 100 mg by mouth daily.     . clonazePAM (KLONOPIN) 0.5 MG tablet Take 0.25-0.5 mg by mouth at bedtime as needed for anxiety (depends on anxiety level).     . colchicine 0.6 MG tablet Take 0.6 mg by mouth 2 (two) times daily as needed (Gout).     . ferrous sulfate 325 (65 FE) MG tablet Take 325 mg by mouth daily with breakfast.    . HYDROcodone-acetaminophen (NORCO/VICODIN) 5-325 MG tablet Take 1-2 tablets by mouth every 6 (six) hours as needed for moderate pain or severe pain. 15 tablet 0  . Multiple Vitamins-Minerals (MULTIVITAMIN WITH MINERALS) tablet Take 1 tablet by mouth daily.    . rivaroxaban (XARELTO) 20 MG TABS tablet Take 1 tablet (20 mg total) by mouth daily with supper. 30 tablet 11   No current facility-administered medications for this visit.     Physical Exam: Vitals:   04/12/17 1417  BP: 124/72  Pulse: 67  SpO2: 96%  Weight: 198 lb (89.8 kg)  Height: 5' 10.5" (1.791 m)    GEN- The patient is well appearing, alert and oriented x 3 today.   Head- normocephalic, atraumatic Eyes-  Sclera clear, conjunctiva pink Ears- hearing intact Oropharynx- clear Lungs- Clear to ausculation bilaterally, normal work of breathing Heart- irregular rate and rhythm, no murmurs, rubs or gallops, PMI not laterally displaced GI- soft, NT, ND, + BS Extremities- no clubbing, cyanosis, or edema   Assessment and Plan:  1. Persistent atrial fibrillation/ atrial flutter ILR is reviewed and reveals burden of 41.5%.  He is in an atrial flutter today but unaware. chads2vasc score is 3.  On xarelto No  changes at this time Return in 2 months.  Reassess after he has healed from hernia surgery.  Could consider cardioversion, AAD, or ablation if needed at that time.  2. HTN Stable No change required today  [facility] Return in 2 months  [name] MD, [facility] 04/12/2017 2:54 PM

## 2017-04-12 NOTE — Patient Instructions (Signed)
Your physician recommends that you schedule a follow-up appointment in: 2 [place]  Your physician recommends that you continue on your current medications as directed. Please refer to the Current Medication list given to you today.  Thank you for choosing [facility]!!

## 2017-04-18 ENCOUNTER — Ambulatory Visit (INDEPENDENT_AMBULATORY_CARE_PROVIDER_SITE_OTHER): Payer: Medicare Other | Admitting: Cardiovascular Disease

## 2017-04-18 ENCOUNTER — Encounter: Payer: Self-pay | Admitting: Cardiovascular Disease

## 2017-04-18 VITALS — BP 118/68 | HR 77 | Ht 70.5 in | Wt 197.2 lb

## 2017-04-18 DIAGNOSIS — I4892 Unspecified atrial flutter: Secondary | ICD-10-CM | POA: Diagnosis not present

## 2017-04-18 MED ORDER — CARVEDILOL 3.125 MG PO TABS: 3.1250 mg | ORAL_TABLET | Freq: Two times a day (BID) | ORAL | 3 refills | Status: DC

## 2017-04-18 NOTE — Progress Notes (Signed)
Chief Complaint  Patient presents with  . Atrial Fibrillation   History of Present Illness: 84 yo male with history of paroxysmal atrial fibrillation s/p ablation in 2012, sleep apnea, HLD, colon cancer who is here Short as an add on to my schedule. He is followed in our office by Dr. Rayann Heman. LV function normal by echo in 2018. Dr. Rayann Heman follows him for atrial fib/flutter. Ablation in 2012 but with recurrence of his a fib/flutter since then. ILR in place. He underwent ventral hernia repair by Dr. Marlou Starks on 02/28/17. He did well with his surgery. He was most recently seen in our office 04/12/17 by Allred and was felt to be doing well. Interrogation of the ILR showed that he is in atrial fib/flutter 41.5 % of the time but he is mostly unaware of this. He has been on no rate controlling agents. He was in Dr. Ethlyn Gallery office [place] and HR noted to be in the 130s. Dr. Marlou Starks called our office and asked [place] to see Bradley [place].   He tells me that he has been feeling weak for several days. He has noticed that his heart rate is elevated. No chest pain or dyspnea. No dizziness, near syncope or syncope. No visual changes.   Primary Care Physician: Manon Hilding, MD Primary Cardiology: Dr. Rayann Heman  Past Medical History:  Diagnosis Date  . Arthritis   . Atrial fibrillation (HCC)    paroxysmal, s/p PVI by Dr Rayann Heman 8/12  . Atrial flutter ([place])    s/p CTI ablation  . Cancer [facility])    4th stage dysplasia mass in colon-removed 7/18  . Dysrhythmia    A. fib/a. a. flutter  . Hyperlipidemia   . Obstructive sleep apnea (adult) (pediatric)    does not use cpap    Past Surgical History:  Procedure Laterality Date  . ATRIAL ABLATION SURGERY     s/p CTI and PVI ablations by Dr Rayann Heman 8/12  . CARDIAC CATHETERIZATION     nonobstructive CAD 2000  . COLONOSCOPY WITH PROPOFOL N/A 08/24/2016   Procedure: COLONOSCOPY WITH PROPOFOL;  Surgeon: Teena Irani, MD;  [facility];  Service: Endoscopy;  Laterality:  N/A;  . INSERTION OF MESH N/A 02/28/2017   Procedure: INSERTION OF MESH;  Surgeon: Jovita Kussmaul, MD;  Location: [place];  Service: General;  Laterality: N/A;  . LAPAROSCOPIC RIGHT COLECTOMY Right 08/28/2016   Procedure: LAPAROSCOPIC ASSISTED RIGHT COLECTOMY;  Surgeon: Jovita Kussmaul, MD;  [facility];  Service: General;  Laterality: Right;  . LOOP RECORDER INSERTION N/A 11/21/2016   Procedure: LOOP RECORDER INSERTION;  Surgeon: [name], MD;  Location: [place] CV LAB;  Service: Cardiovascular;  Laterality: N/A;  . melanoma (other)     resected from his right shoulder  . VENTRAL HERNIA REPAIR  02/28/2017  . VENTRAL HERNIA REPAIR N/A 02/28/2017   Procedure: LAPAROSCOPIC VENTRAL HERNIA REPAIR WITH MESH;  Surgeon: Jovita Kussmaul, MD;  Location: [place];  Service: General;  Laterality: N/A;    Current Outpatient Medications  Medication Sig Dispense Refill  . acetaminophen (TYLENOL) 325 MG tablet Take 325 mg by mouth every 6 (six) hours as needed for mild pain.    Marland Kitchen allopurinol (ZYLOPRIM) 100 MG tablet Take 100 mg by mouth daily.     . clonazePAM (KLONOPIN) 0.5 MG tablet Take 0.25-0.5 mg by mouth at bedtime as needed for anxiety (depends on anxiety level).     . colchicine 0.6 MG tablet Take 0.6 mg by  mouth 2 (two) times daily as needed (Gout).     . ferrous sulfate 325 (65 FE) MG tablet Take 325 mg by mouth daily with breakfast.    . HYDROcodone-acetaminophen (NORCO/VICODIN) 5-325 MG tablet Take 1-2 tablets by mouth every 6 (six) hours as needed for moderate pain or severe pain. 15 tablet 0  . Multiple Vitamins-Minerals (MULTIVITAMIN WITH MINERALS) tablet Take 1 tablet by mouth daily.    . rivaroxaban (XARELTO) 20 MG TABS tablet Take 1 tablet (20 mg total) by mouth daily with supper. 90 tablet 1  . carvedilol (COREG) 3.125 MG tablet Take 1 tablet (3.125 mg total) by mouth 2 (two) times daily. 60 tablet 3   No current facility-administered medications for this visit.     Allergies    Allergen Reactions  . Amiodarone     Developed severe drug rash while on Amiodarone and Diltiazem unclear which one of them was culprit  . Diltiazem Hcl     REACTION: Looked like burned all over  . Lisinopril     REACTION: Looked like burned all over  . Metoprolol Succinate     REACTION: Looked like burned all over  . Propafenone Hcl     REACTION: Looked like burned all over  . Robaxin [Methocarbamol]     Rash all over    Social History   Socioeconomic History  . Marital status: Married    Spouse name: Not on file  . Number of children: Not on file  . Years of education: Not on file  . Highest education level: Not on file  Social Needs  . Financial resource strain: Not on file  . Food insecurity - worry: Not on file  . Food insecurity - inability: Not on file  . Transportation needs - medical: Not on file  . Transportation needs - non-medical: Not on file  Occupational History  . Not on file  Tobacco Use  . Smoking status: Never Smoker  . Smokeless tobacco: Never Used  Substance and Sexual Activity  . Alcohol use: No  . Drug use: No  . Sexual activity: Not on file  Other Topics Concern  . Not on file  Social History Narrative   Lives in [place].  Retired Warden/ranger    Family History  Problem Relation Age of Onset  . Prostate cancer Father   . Seizures Other   . Cancer Other   . Diabetes Other   . Heart disease Other     Review of Systems:  As stated in the HPI and otherwise negative.   BP 118/68   Pulse 77   Ht 5' 10.5" (1.791 m)   Wt 197 lb 3.2 oz (89.4 kg)   SpO2 98%   BMI 27.90 kg/m   Physical Examination: General: Well developed, well nourished, NAD  HEENT: OP clear, mucus membranes moist  SKIN: warm, dry. No rashes. Neuro: No focal deficits  Musculoskeletal: Muscle strength 5/5 all ext  Psychiatric: Mood and affect normal  Neck: No JVD, no carotid bruits, no thyromegaly, no lymphadenopathy.  Lungs:Clear bilaterally, no wheezes, rhonci,  crackles Cardiovascular: Irreg irreg. No loud murmurs.  Abdomen:Soft. Bowel sounds present. Non-tender.  Extremities: No lower extremity edema. Pulses are 2 + in the bilateral DP/PT.  EKG:  EKG is ordered Short. The ekg ordered Short demonstrates Atrial flutter with variable block. HR 80s.   ILR interrogation: HR 130s, atrail flutter.   Recent Labs: 09/04/2016: Magnesium 2.3 09/08/2016: ALT 61 03/03/2017: BUN 20; Creatinine, Ser 1.43; Hemoglobin  14.3; Platelets 132; Potassium 4.3; Sodium 135   Lipid Panel    Component Value Date/Time   TRIG 104 09/04/2016 0808     Wt Readings from Last 3 Encounters:  04/18/17 197 lb 3.2 oz (89.4 kg)  04/12/17 198 lb (89.8 kg)  02/28/17 201 lb (91.2 kg)     Other studies Reviewed: Additional studies/ records that were reviewed Short include: . Review of the above records demonstrates:    Assessment and Plan:   1. Atrial flutter/fibrillation, persistent: His heart rate is 77 bpm on the EKG but on exam he is tachycardic. ILR interrogated and heart rate is 130s. He is feeling weak Short. I will start Coreg 3.125 mg po BID Short. Dr. Rayann Heman had mentioned in his note in December 2018 that [place] would be restarted if ventricular rates were high. He is on Xarelto. He will need close follow up with DR. Allred.    Current medicines are reviewed at length with the patient Short.  The patient does not have concerns regarding medicines.  The following changes have been made:  no change  Labs/ tests ordered Short include:   Orders Placed This Encounter  Procedures  . EKG 12-Lead     Disposition:   FU with Dr. Rayann Heman in next few weeks.    Signed, Lauree Chandler, MD 04/18/2017 1:51 PM    [facility] [address], [place], [place]  [address] Phone: [phone]; Fax: [phone]

## 2017-04-18 NOTE — Patient Instructions (Signed)
Medication Instructions:  Your physician has recommended you make the following change in your medication:  Start Coreg 3.125 mg by mouth twice daily.    Labwork: none  Testing/Procedures: none  Follow-Up: Office will call you to schedule follow up with Dr. Rayann Heman.   Any Other Special Instructions Will Be Listed Below (If Applicable).     If you need a refill on your cardiac medications before your next appointment, please call your pharmacy.

## 2017-04-20 LAB — CUP PACEART INCLINIC DEVICE CHECK
Implantable Pulse Generator Implant Date: 20180925
MDC IDC SESS DTM: 20190222133949

## 2017-04-23 ENCOUNTER — Ambulatory Visit (INDEPENDENT_AMBULATORY_CARE_PROVIDER_SITE_OTHER): Payer: Medicare Other | Admitting: *Deleted

## 2017-04-23 DIAGNOSIS — I480 Paroxysmal atrial fibrillation: Secondary | ICD-10-CM

## 2017-04-23 NOTE — Progress Notes (Signed)
Carelink Summary Report / Loop Recorder 

## 2017-05-02 ENCOUNTER — Encounter: Payer: Self-pay | Admitting: Internal Medicine

## 2017-05-02 ENCOUNTER — Ambulatory Visit (INDEPENDENT_AMBULATORY_CARE_PROVIDER_SITE_OTHER): Payer: Medicare Other | Admitting: Internal Medicine

## 2017-05-02 VITALS — BP 126/64 | HR 71 | Ht 70.5 in | Wt 200.0 lb

## 2017-05-02 DIAGNOSIS — I481 Persistent atrial fibrillation: Secondary | ICD-10-CM

## 2017-05-02 DIAGNOSIS — I4892 Unspecified atrial flutter: Secondary | ICD-10-CM | POA: Diagnosis not present

## 2017-05-02 DIAGNOSIS — I10 Essential (primary) hypertension: Secondary | ICD-10-CM | POA: Diagnosis not present

## 2017-05-02 DIAGNOSIS — I4819 Other persistent atrial fibrillation: Secondary | ICD-10-CM

## 2017-05-02 LAB — CUP PACEART INCLINIC DEVICE CHECK
Date Time Interrogation Session: 20190306172401
MDC IDC PG IMPLANT DT: 20180925

## 2017-05-02 MED ORDER — CARVEDILOL 6.25 MG PO TABS: 6.2500 mg | ORAL_TABLET | Freq: Two times a day (BID) | ORAL | 3 refills | Status: DC

## 2017-05-02 NOTE — Patient Instructions (Addendum)
Medication Instructions:  Your physician has recommended you make the following change in your medication:  1.  Increase your carvedilol 3.125 mg tablet.  Take TWO tablets (6.25 mg) by mouth twice a day. ----When this prescription runs out, your new prescription will be for 6.25 mg tablets.  Labwork: None ordered.  Testing/Procedures: None ordered.  Follow-Up: Your physician wants you to follow-up in: 2 months with Dr. Rayann Heman.      Any Other Special Instructions Will Be Listed Below (If Applicable).  If you need a refill on your cardiac medications before your next appointment, please call your pharmacy.

## 2017-05-02 NOTE — Progress Notes (Signed)
PCP: Manon Hilding, MD   Primary EP: Dr Teressa Senter is a 84 y.o. male who presents today for routine electrophysiology followup.  Since last being seen in our clinic, the patient reports doing reasonably well.  He is recovering from hernia surgery.  He did see Dr Angelena Form 04/18/17 and was found to have RVR.  He was restarted on coreg.  He has done well since.  No concerns today.  Today, he denies symptoms of palpitations, chest pain, shortness of breath,  lower extremity edema, dizziness, presyncope, or syncope.  The patient is otherwise without complaint today.   Past Medical History:  Diagnosis Date  . Arthritis   . Atrial fibrillation (HCC)    paroxysmal, s/p PVI by Dr Rayann Heman 8/12  . Atrial flutter ([place])    s/p CTI ablation  . Cancer [facility])    4th stage dysplasia mass in colon-removed 7/18  . Dysrhythmia    A. fib/a. a. flutter  . Hyperlipidemia   . Obstructive sleep apnea (adult) (pediatric)    does not use cpap   Past Surgical History:  Procedure Laterality Date  . ATRIAL ABLATION SURGERY     s/p CTI and PVI ablations by Dr Rayann Heman 8/12  . CARDIAC CATHETERIZATION     nonobstructive CAD 2000  . COLONOSCOPY WITH PROPOFOL N/A 08/24/2016   Procedure: COLONOSCOPY WITH PROPOFOL;  Surgeon: Teena Irani, MD;  [facility];  Service: Endoscopy;  Laterality: N/A;  . INSERTION OF MESH N/A 02/28/2017   Procedure: INSERTION OF MESH;  Surgeon: Jovita Kussmaul, MD;  Location: [place];  Service: General;  Laterality: N/A;  . LAPAROSCOPIC RIGHT COLECTOMY Right 08/28/2016   Procedure: LAPAROSCOPIC ASSISTED RIGHT COLECTOMY;  Surgeon: Jovita Kussmaul, MD;  [facility];  Service: General;  Laterality: Right;  . LOOP RECORDER INSERTION N/A 11/21/2016   Procedure: LOOP RECORDER INSERTION;  Surgeon: [name], MD;  Location: [place] CV LAB;  Service: Cardiovascular;  Laterality: N/A;  . melanoma (other)     resected from his right shoulder  . VENTRAL HERNIA REPAIR   02/28/2017  . VENTRAL HERNIA REPAIR N/A 02/28/2017   Procedure: LAPAROSCOPIC VENTRAL HERNIA REPAIR WITH MESH;  Surgeon: Jovita Kussmaul, MD;  Location: [place];  Service: General;  Laterality: N/A;    ROS- all systems are reviewed and negatives except as per HPI above  Current Outpatient Medications  Medication Sig Dispense Refill  . acetaminophen (TYLENOL) 325 MG tablet Take 325 mg by mouth every 6 (six) hours as needed for mild pain.    Marland Kitchen allopurinol (ZYLOPRIM) 100 MG tablet Take 100 mg by mouth daily.     . carvedilol (COREG) 3.125 MG tablet Take 1 tablet (3.125 mg total) by mouth 2 (two) times daily. 60 tablet 3  . colchicine 0.6 MG tablet Take 0.6 mg by mouth 2 (two) times daily as needed (Gout).     . ferrous sulfate 325 (65 FE) MG tablet Take 325 mg by mouth daily with breakfast.    . Multiple Vitamins-Minerals (MULTIVITAMIN WITH MINERALS) tablet Take 1 tablet by mouth daily.    . rivaroxaban (XARELTO) 20 MG TABS tablet Take 1 tablet (20 mg total) by mouth daily with supper. 90 tablet 1   No current facility-administered medications for this visit.     Physical Exam: Vitals:   05/02/17 1409  BP: 126/64  Pulse: 71  Weight: 200 lb (90.7 kg)  Height: 5' 10.5" (1.791 m)    GEN- The patient  is well appearing, alert and oriented x 3 today.   Head- normocephalic, atraumatic Eyes-  Sclera clear, conjunctiva pink Ears- hearing intact Oropharynx- clear Lungs- Clear to ausculation bilaterally, normal work of breathing Heart- irregular rate and rhythm, no murmurs, rubs or gallops, PMI not laterally displaced GI- soft, NT, ND, + BS Extremities- no clubbing, cyanosis, or edema  EKG tracing ordered today is personally reviewed and shows atrial flutter, V rate 71 bpm, nonspecific St/T changes  Assessment and Plan:  1. Persistent atrial fibrillation/ atrial flutter In atrial flutter today ILR is reviewed.  Unfortunately, the device is not detecting his atrial flutter.  V rates have  improved since coreg was started by Dr Angelena Form chads2vasc score is 3. On xarelto.   Will increase coreg to 6.25mg  BID today.  2. HTN Stable No change required today  [facility]link Return to see me in 2 months  [name] MD, [facility] 05/02/2017 2:20 PM

## 2017-05-10 DIAGNOSIS — H903 Sensorineural hearing loss, bilateral: Secondary | ICD-10-CM | POA: Diagnosis not present

## 2017-05-10 DIAGNOSIS — H9313 Tinnitus, bilateral: Secondary | ICD-10-CM | POA: Diagnosis not present

## 2017-05-14 DIAGNOSIS — H903 Sensorineural hearing loss, bilateral: Secondary | ICD-10-CM | POA: Diagnosis not present

## 2017-05-14 DIAGNOSIS — H9313 Tinnitus, bilateral: Secondary | ICD-10-CM | POA: Diagnosis not present

## 2017-05-28 ENCOUNTER — Ambulatory Visit (INDEPENDENT_AMBULATORY_CARE_PROVIDER_SITE_OTHER): Payer: Medicare Other | Admitting: *Deleted

## 2017-05-28 DIAGNOSIS — I4892 Unspecified atrial flutter: Secondary | ICD-10-CM

## 2017-05-28 LAB — CUP PACEART REMOTE DEVICE CHECK
Implantable Pulse Generator Implant Date: 20180925
MDC IDC SESS DTM: 20190225191130

## 2017-05-28 NOTE — Progress Notes (Signed)
Carelink Summary Report / Loop Recorder 

## 2017-06-01 ENCOUNTER — Encounter: Payer: Medicare Other | Admitting: Internal Medicine

## 2017-06-12 DIAGNOSIS — N183 Chronic kidney disease, stage 3 (moderate): Secondary | ICD-10-CM | POA: Diagnosis not present

## 2017-06-12 DIAGNOSIS — R7301 Impaired fasting glucose: Secondary | ICD-10-CM | POA: Diagnosis not present

## 2017-06-12 DIAGNOSIS — I482 Chronic atrial fibrillation: Secondary | ICD-10-CM | POA: Diagnosis not present

## 2017-06-12 DIAGNOSIS — M109 Gout, unspecified: Secondary | ICD-10-CM | POA: Diagnosis not present

## 2017-06-12 DIAGNOSIS — K210 Gastro-esophageal reflux disease with esophagitis: Secondary | ICD-10-CM | POA: Diagnosis not present

## 2017-06-12 DIAGNOSIS — I9589 Other hypotension: Secondary | ICD-10-CM | POA: Diagnosis not present

## 2017-06-12 DIAGNOSIS — E7800 Pure hypercholesterolemia, unspecified: Secondary | ICD-10-CM | POA: Diagnosis not present

## 2017-06-19 DIAGNOSIS — K210 Gastro-esophageal reflux disease with esophagitis: Secondary | ICD-10-CM | POA: Diagnosis not present

## 2017-06-19 DIAGNOSIS — Z0001 Encounter for general adult medical examination with abnormal findings: Secondary | ICD-10-CM | POA: Diagnosis not present

## 2017-06-19 DIAGNOSIS — N529 Male erectile dysfunction, unspecified: Secondary | ICD-10-CM | POA: Diagnosis not present

## 2017-06-19 DIAGNOSIS — F411 Generalized anxiety disorder: Secondary | ICD-10-CM | POA: Diagnosis not present

## 2017-06-19 DIAGNOSIS — E7800 Pure hypercholesterolemia, unspecified: Secondary | ICD-10-CM | POA: Diagnosis not present

## 2017-06-19 DIAGNOSIS — N183 Chronic kidney disease, stage 3 (moderate): Secondary | ICD-10-CM | POA: Diagnosis not present

## 2017-06-19 DIAGNOSIS — R7301 Impaired fasting glucose: Secondary | ICD-10-CM | POA: Diagnosis not present

## 2017-06-19 DIAGNOSIS — I482 Chronic atrial fibrillation: Secondary | ICD-10-CM | POA: Diagnosis not present

## 2017-06-19 DIAGNOSIS — M109 Gout, unspecified: Secondary | ICD-10-CM | POA: Diagnosis not present

## 2017-06-19 DIAGNOSIS — Z6827 Body mass index (BMI) 27.0-27.9, adult: Secondary | ICD-10-CM | POA: Diagnosis not present

## 2017-06-27 ENCOUNTER — Telehealth: Payer: Self-pay | Admitting: Cardiology

## 2017-06-27 NOTE — Telephone Encounter (Signed)
Spoke w/ pt wife and requested that he send a manual transmission b/c his home monitor has not updated in at least 14 days.   

## 2017-06-28 ENCOUNTER — Ambulatory Visit (INDEPENDENT_AMBULATORY_CARE_PROVIDER_SITE_OTHER): Payer: Medicare Other | Admitting: *Deleted

## 2017-06-28 DIAGNOSIS — I4892 Unspecified atrial flutter: Secondary | ICD-10-CM

## 2017-06-28 NOTE — Progress Notes (Signed)
Carelink Summary Report / Loop Recorder 

## 2017-06-29 ENCOUNTER — Encounter: Payer: Self-pay | Admitting: Internal Medicine

## 2017-06-29 ENCOUNTER — Ambulatory Visit (INDEPENDENT_AMBULATORY_CARE_PROVIDER_SITE_OTHER): Payer: Medicare Other | Admitting: Internal Medicine

## 2017-06-29 VITALS — BP 129/82 | HR 71 | Ht 70.5 in | Wt 199.6 lb

## 2017-06-29 DIAGNOSIS — I481 Persistent atrial fibrillation: Secondary | ICD-10-CM | POA: Diagnosis not present

## 2017-06-29 DIAGNOSIS — I4819 Other persistent atrial fibrillation: Secondary | ICD-10-CM

## 2017-06-29 DIAGNOSIS — I10 Essential (primary) hypertension: Secondary | ICD-10-CM

## 2017-06-29 LAB — CUP PACEART INCLINIC DEVICE CHECK
Date Time Interrogation Session: 20190503124900
MDC IDC PG IMPLANT DT: 20180925

## 2017-06-29 NOTE — Progress Notes (Signed)
PCP: Manon Hilding, MD   Primary EP: Dr Teressa Senter is a 84 y.o. male who presents today for routine electrophysiology followup.  Since last being seen in our clinic, the patient reports doing reasonably well.  He has had increasing afib.  + palpitations and fatigue.  He also reports decreased exercise tolerance.  Today, he denies symptoms of chest pain, shortness of breath,  lower extremity edema, dizziness, presyncope, or syncope.  The patient is otherwise without complaint today.   Past Medical History:  Diagnosis Date  . Arthritis   . Atrial fibrillation (HCC)    paroxysmal, s/p PVI by Dr Rayann Heman 8/12  . Atrial flutter ([place])    s/p CTI ablation  . Cancer [facility])    4th stage dysplasia mass in colon-removed 7/18  . Dysrhythmia    A. fib/a. a. flutter  . Hyperlipidemia   . Obstructive sleep apnea (adult) (pediatric)    does not use cpap   Past Surgical History:  Procedure Laterality Date  . ATRIAL ABLATION SURGERY     s/p CTI and PVI ablations by Dr Rayann Heman 8/12  . CARDIAC CATHETERIZATION     nonobstructive CAD 2000  . COLONOSCOPY WITH PROPOFOL N/A 08/24/2016   Procedure: COLONOSCOPY WITH PROPOFOL;  Surgeon: Teena Irani, MD;  [facility];  Service: Endoscopy;  Laterality: N/A;  . INSERTION OF MESH N/A 02/28/2017   Procedure: INSERTION OF MESH;  Surgeon: Jovita Kussmaul, MD;  Location: [place];  Service: General;  Laterality: N/A;  . LAPAROSCOPIC RIGHT COLECTOMY Right 08/28/2016   Procedure: LAPAROSCOPIC ASSISTED RIGHT COLECTOMY;  Surgeon: Jovita Kussmaul, MD;  [facility];  Service: General;  Laterality: Right;  . LOOP RECORDER INSERTION N/A 11/21/2016   Procedure: LOOP RECORDER INSERTION;  Surgeon: [name], MD;  Location: [place] CV LAB;  Service: Cardiovascular;  Laterality: N/A;  . melanoma (other)     resected from his right shoulder  . VENTRAL HERNIA REPAIR  02/28/2017  . VENTRAL HERNIA REPAIR N/A 02/28/2017   Procedure: LAPAROSCOPIC VENTRAL  HERNIA REPAIR WITH MESH;  Surgeon: Jovita Kussmaul, MD;  Location: [place];  Service: General;  Laterality: N/A;    ROS- all systems are reviewed and negatives except as per HPI above  Current Outpatient Medications  Medication Sig Dispense Refill  . acetaminophen (TYLENOL) 325 MG tablet Take 325 mg by mouth every 6 (six) hours as needed for mild pain.    Marland Kitchen allopurinol (ZYLOPRIM) 100 MG tablet Take 100 mg by mouth daily.     . carvedilol (COREG) 6.25 MG tablet Take 1 tablet (6.25 mg total) by mouth 2 (two) times daily. 180 tablet 3  . colchicine 0.6 MG tablet Take 0.6 mg by mouth 2 (two) times daily as needed (Gout).     . ferrous sulfate 325 (65 FE) MG tablet Take 325 mg by mouth daily with breakfast.    . Multiple Vitamins-Minerals (MULTIVITAMIN WITH MINERALS) tablet Take 1 tablet by mouth daily.    . rivaroxaban (XARELTO) 20 MG TABS tablet Take 1 tablet (20 mg total) by mouth daily with supper. 90 tablet 1   No current facility-administered medications for this visit.     Physical Exam: Vitals:   06/29/17 0849  BP: 129/82  Pulse: 71  SpO2: 99%  Weight: 199 lb 9.6 oz (90.5 kg)  Height: 5' 10.5" (1.791 m)    GEN- The patient is well appearing, alert and oriented x 3 today.   Head- normocephalic, atraumatic  Eyes-  Sclera clear, conjunctiva pink Ears- hearing intact Oropharynx- clear Lungs- Clear to ausculation bilaterally, normal work of breathing Heart- Regular rate and rhythm, no murmurs, rubs or gallops, PMI not laterally displaced GI- soft, NT, ND, + BS Extremities- no clubbing, cyanosis, or edema   Assessment and Plan:  1. Persistent afib/ atrial flutter ILR interrogation today reveals atrial arrhythia burden is 25 %,  V rates are controlled Last visit ekg suggests typical atrial flutter also, despite prior CTI ablation. chads2vasc score is 3.  Continue xarelto He has not previously tolerated amiodarone, rhythmol, or diltiazem.   Therapeutic strategies for afib  including medicine and ablation were discussed in detail with the patient today. He is very reluctant to consider any new AAD options such as multaq or tikosyn.  Risk, benefits, and alternatives to repeat EP study and radiofrequency ablation for afib were also discussed in detail today. These risks include but are not limited to stroke, bleeding, vascular damage, tamponade, perforation, damage to the esophagus, lungs, and other structures, pulmonary vein stenosis, worsening renal function, and death. The patient understands these risk and wishes to proceed.  We will therefore proceed with catheter ablation at the next available time. Carto, ICE, and anesthesia are requrested for this procedure. Will need Cardiac CT several days prior to ablation to exclude LAA thrombus or PV stenosis.  2. HTN Stable No change required today  Carelink  [name] MD, [facility] 06/29/2017 8:56 AM

## 2017-06-29 NOTE — Patient Instructions (Addendum)
Medication Instructions:   Your physician recommends that you continue on your current medications as directed. Please refer to the Current Medication list given to you today.  Labwork:  NONE  Testing/Procedures: Your physician has recommended that you have an ablation. Catheter ablation is a medical procedure used to treat some cardiac arrhythmias (irregular heartbeats). During catheter ablation, a long, thin, flexible tube is put into a blood vessel in your groin (upper thigh), or neck. This tube is called an ablation catheter. It is then guided to your heart through the blood vessel. Radio frequency waves destroy small areas of heart tissue where abnormal heartbeats may cause an arrhythmia to start. You will be contacted by Sonia Baller for instructions, date and time.   Follow-Up: Your physician recommends that you schedule a follow-up appointment in: with Dr. Rayann Heman after you have your ablation.   Any Other Special Instructions Will Be Listed Below (If Applicable).  Continue monthly checks on your recorder.  If you need a refill on your cardiac medications before your next appointment, please call your pharmacy.

## 2017-07-02 ENCOUNTER — Telehealth: Payer: Self-pay

## 2017-07-02 DIAGNOSIS — I4819 Other persistent atrial fibrillation: Secondary | ICD-10-CM

## 2017-07-02 LAB — CUP PACEART REMOTE DEVICE CHECK
Date Time Interrogation Session: 20190330181031
Implantable Pulse Generator Implant Date: 20180925

## 2017-07-02 NOTE — Telephone Encounter (Signed)
-----   Message from [name], MD sent at 06/29/2017  9:15 AM EDT ----- Please call and schedule for afib ablation. Will need Carto, ICE, anesthesia.  Cardiac CT prior to ablation.  TY

## 2017-07-02 NOTE — Telephone Encounter (Signed)
Call placed to Pt.  Spoke with wife per DPR. Pt scheduled for afib ablation on August 02, 2017 2nd case d/t distance from hospital.

## 2017-07-04 NOTE — Telephone Encounter (Signed)
Instruction letters created and mailed per Pt request.

## 2017-07-05 ENCOUNTER — Encounter: Payer: Self-pay | Admitting: Cardiology

## 2017-07-08 IMAGING — DX DG CHEST 2V
2 series · 2 of 2 positions shown · non-contrast
Comparison: 10/11/2010

CLINICAL DATA: Preop obstructive sleep apnea

EXAM:
CHEST  2 VIEW

[chest pa]
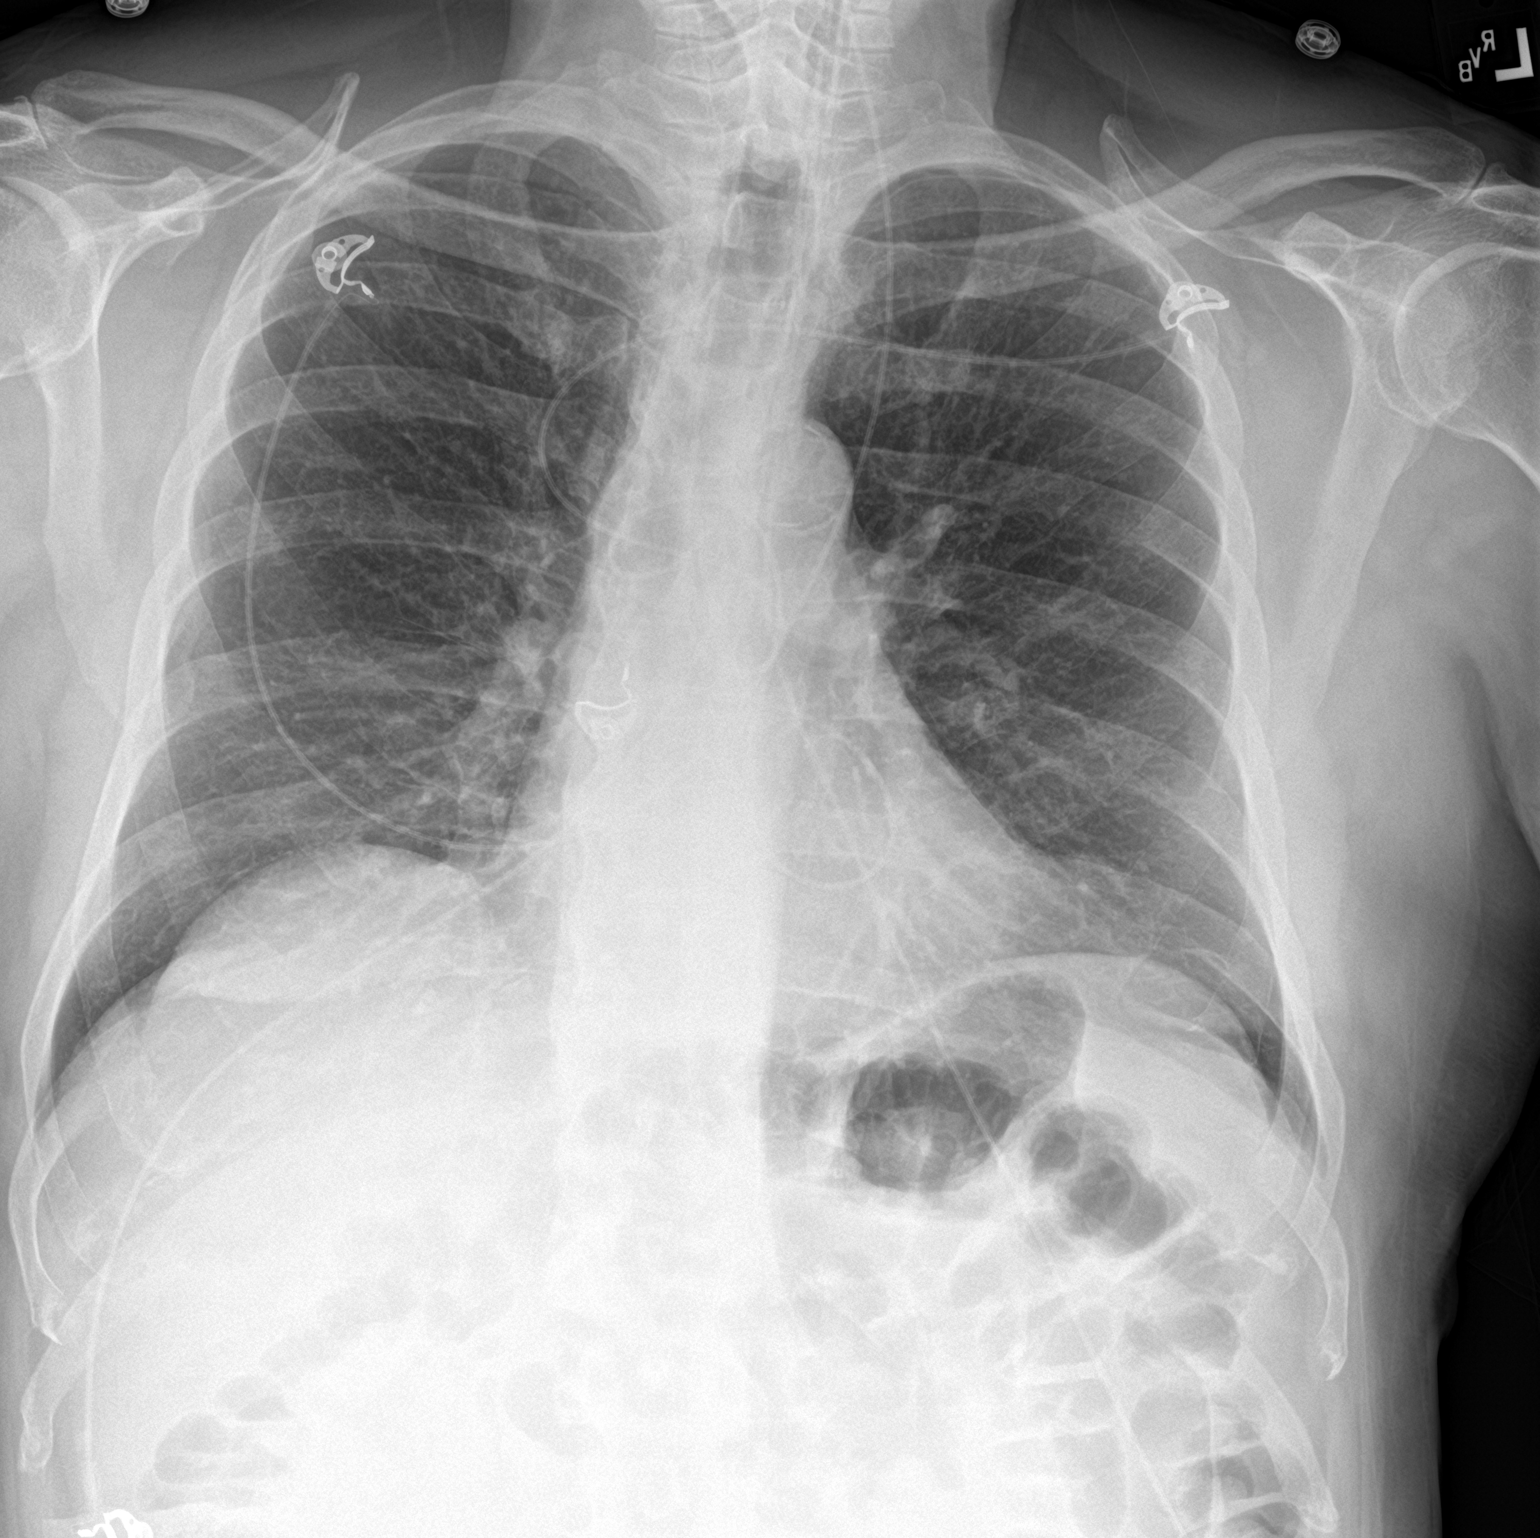

[chest lat]
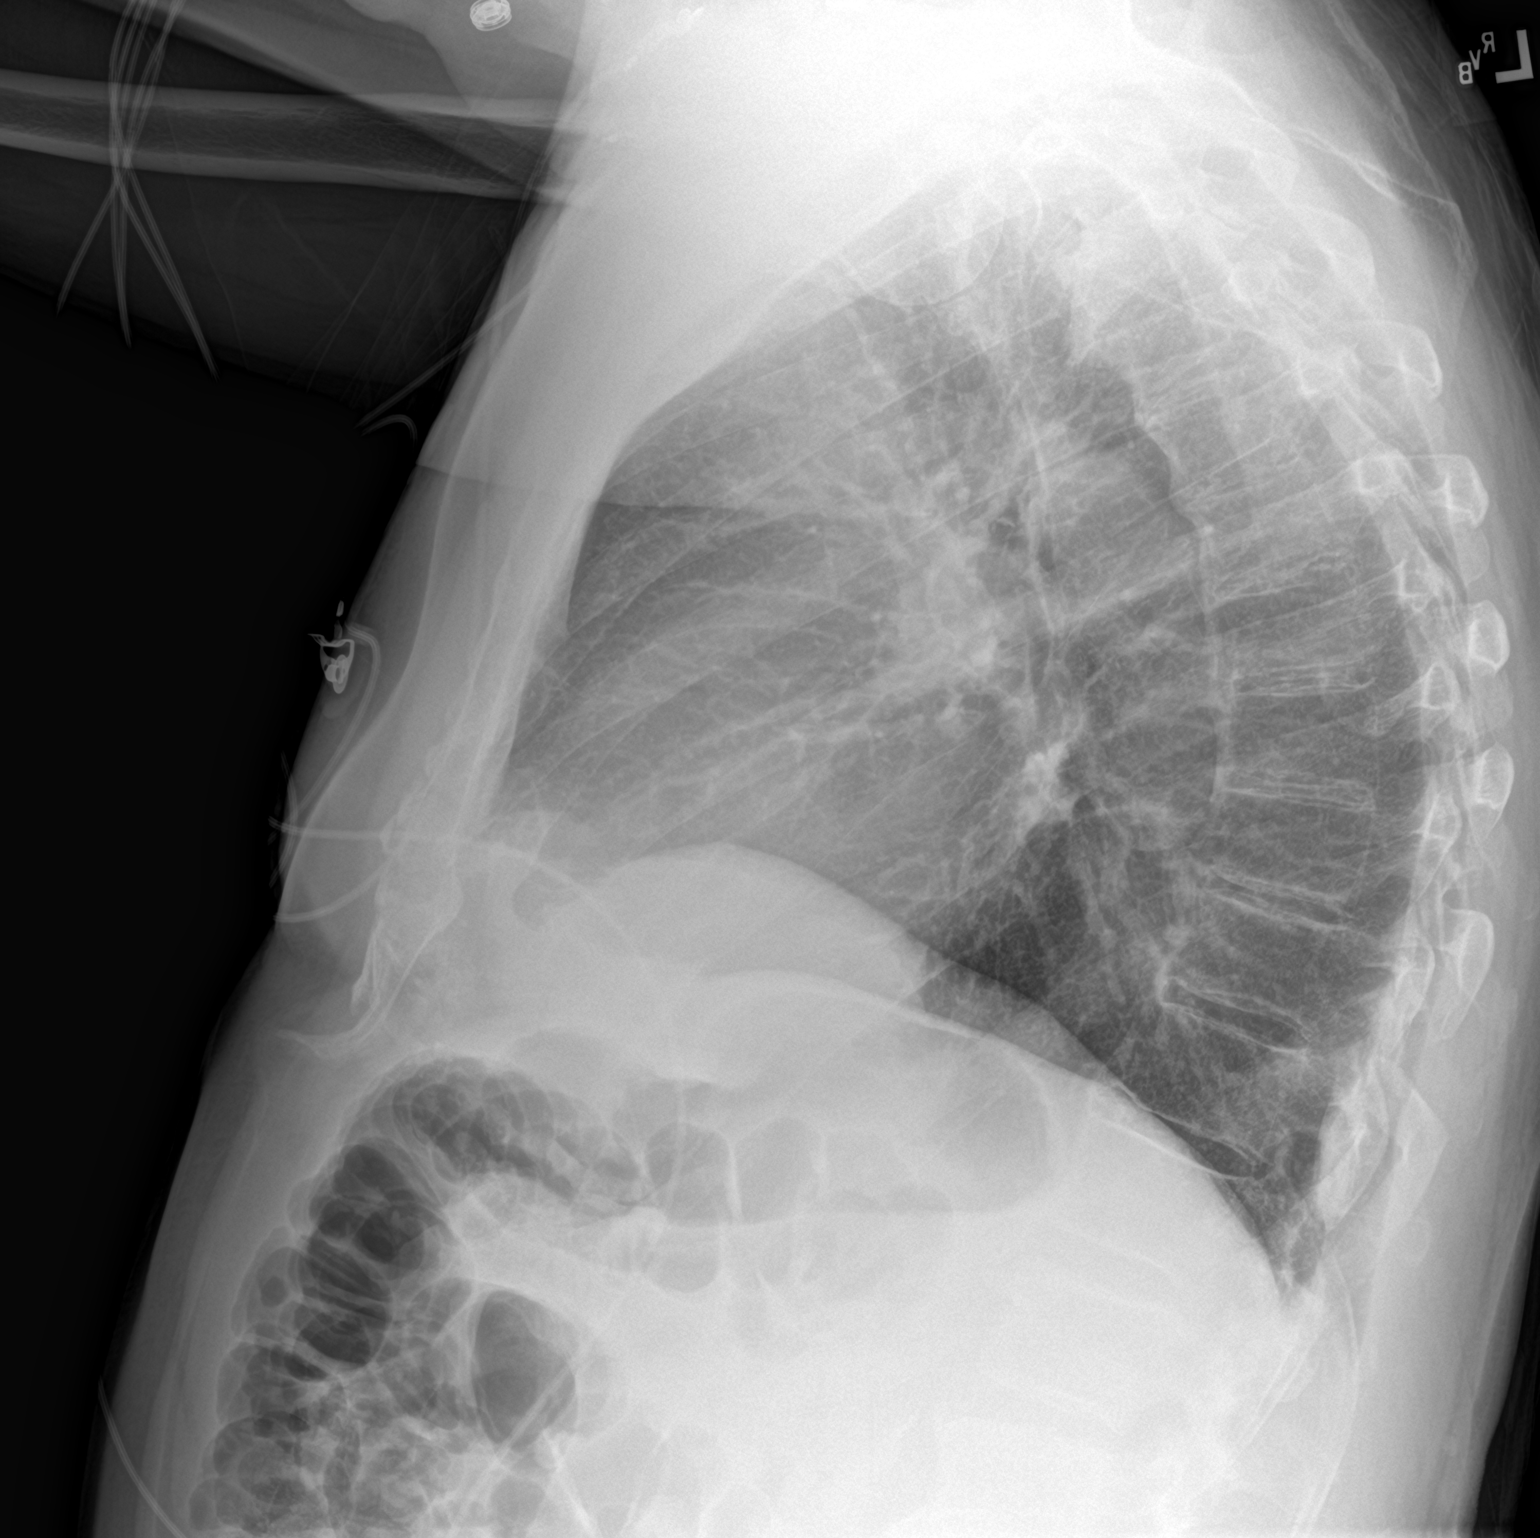

[2 of 2 positions shown; findings below may reference images not displayed]

FINDINGS: Heart and mediastinal contours are within normal limits. No focal
opacities or effusions. No acute bony abnormality.
IMPRESSION: No active cardiopulmonary disease.

## 2017-07-08 IMAGING — CT CT ABD-PELV W/O CM
2 of 4 series · 16 of 46 positions shown, 18 images · non-contrast
Comparison: 08/21/2016

CLINICAL DATA: Preop right colectomy.  GI bleed.

EXAM:
CT ABDOMEN AND PELVIS WITHOUT CONTRAST
TECHNIQUE: Multidetector CT imaging of the abdomen and pelvis was performed
following the standard protocol without IV contrast.

[Series 2: axial st · axial · 0.82mm/px · z∈[-490,-40]mm · 13 of 102 slices shown, 15 images]
[im 6/102  soft-tissue]
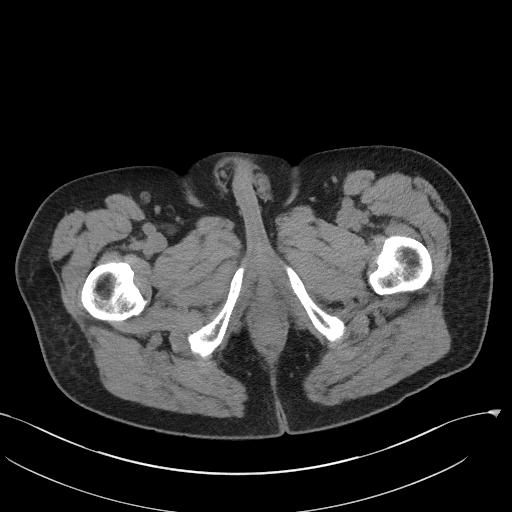
[im 6/102  bone]
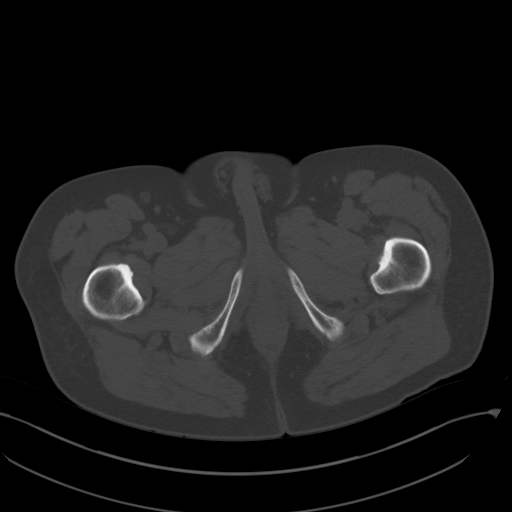
[im 16/102  soft-tissue]
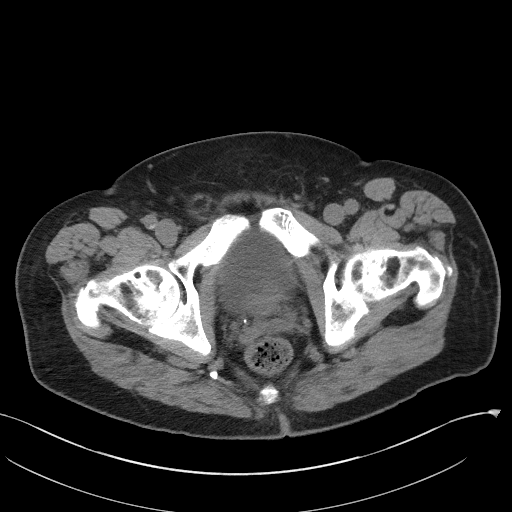
[im 22/102  soft-tissue]
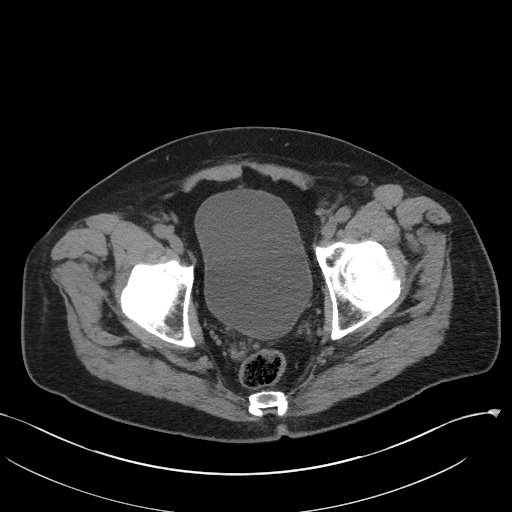
[im 27/102  soft-tissue]
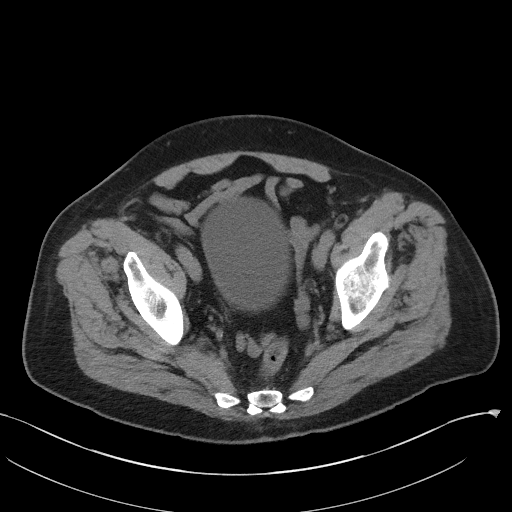
[im 38/102  soft-tissue]
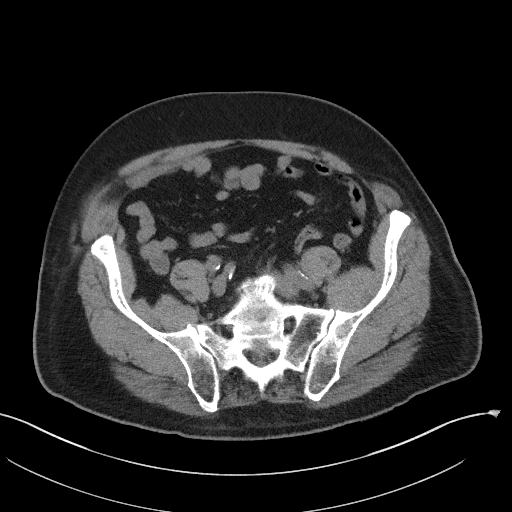
[im 43/102  soft-tissue]
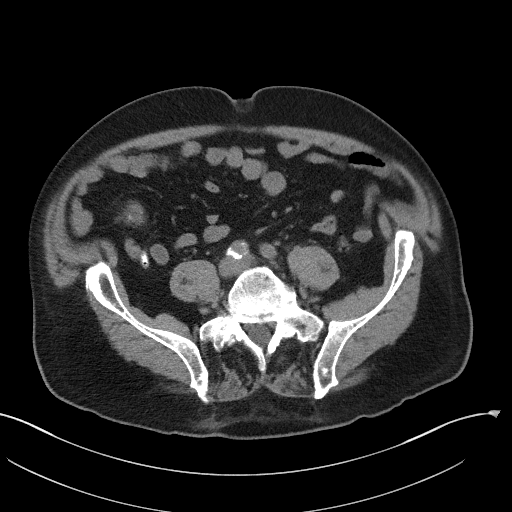
[im 54/102  soft-tissue]
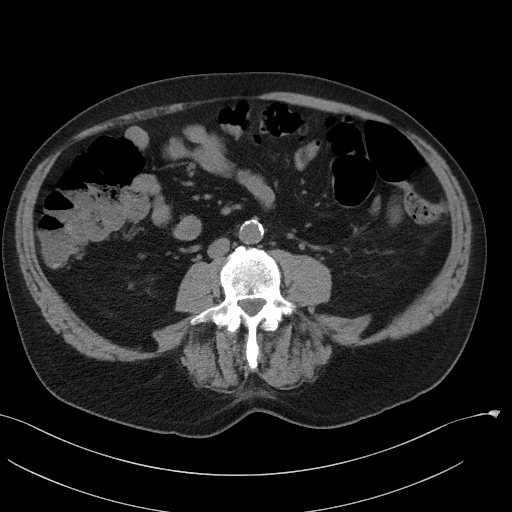
[im 59/102  soft-tissue]
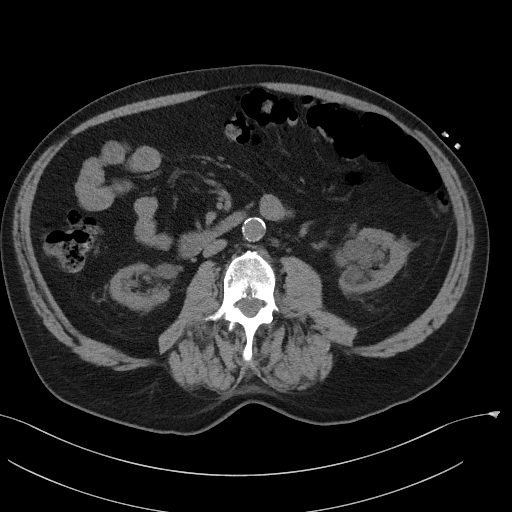
[im 64/102  soft-tissue]
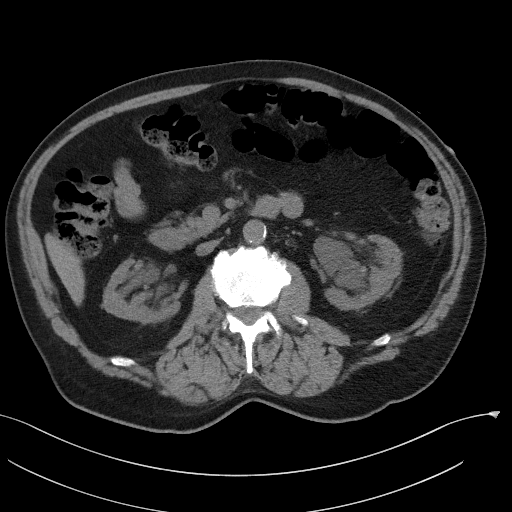
[im 64/102  bone]
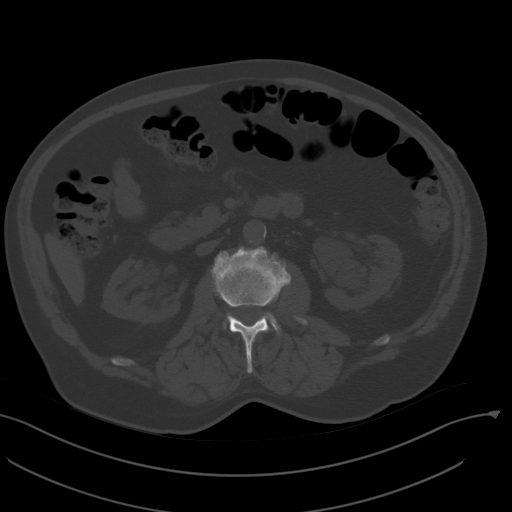
[im 75/102  soft-tissue]
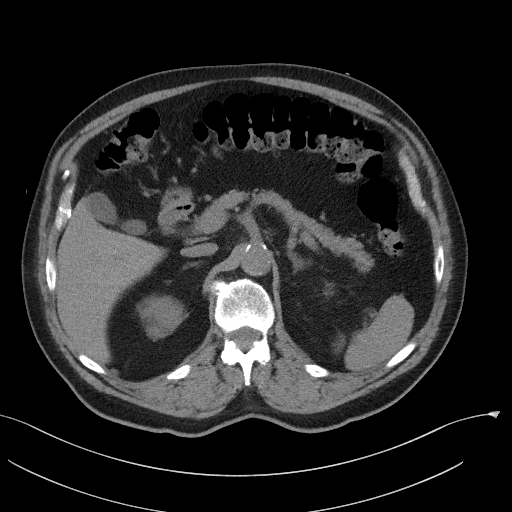
[im 80/102  soft-tissue]
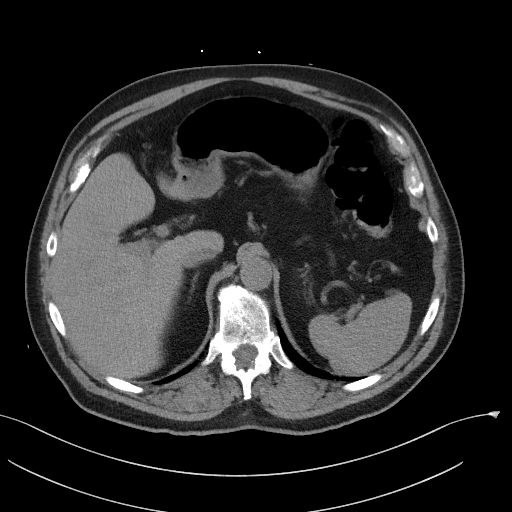
[im 86/102  soft-tissue]
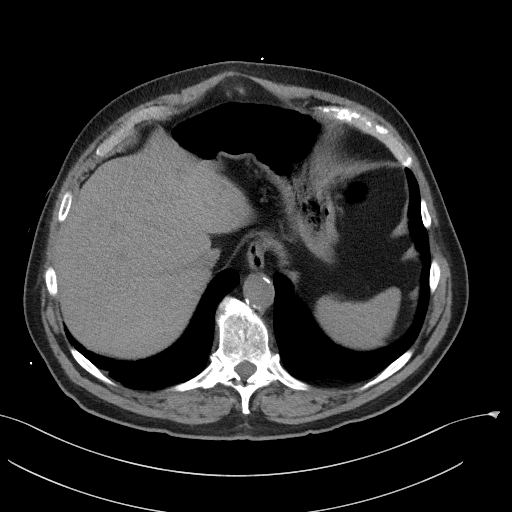
[im 96/102  soft-tissue]
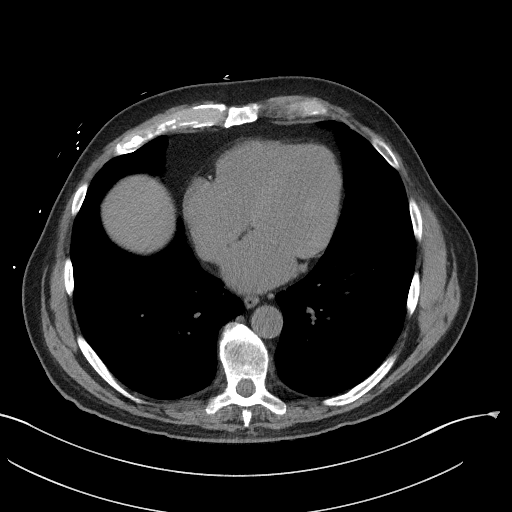

[Series 5: coronal st · coronal · 0.82mm/px · 3 of 99 slices shown]
[im 33/99  soft-tissue]
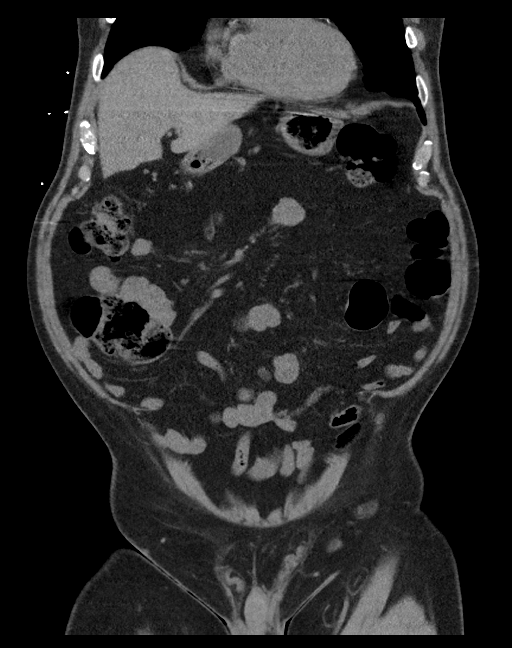
[im 44/99  soft-tissue]
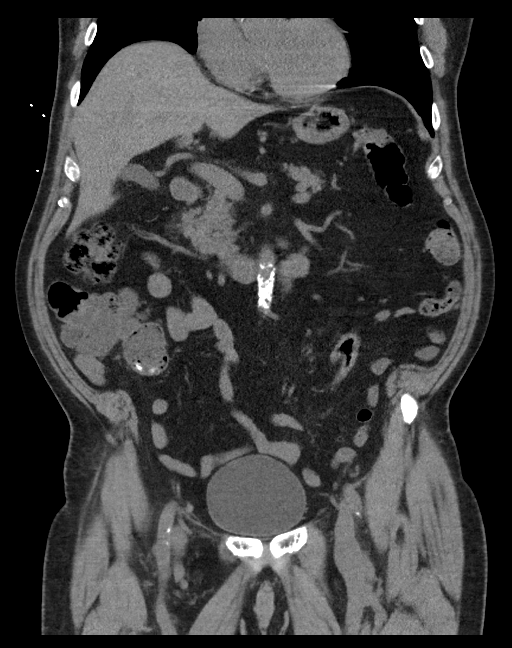
[im 55/99  soft-tissue]
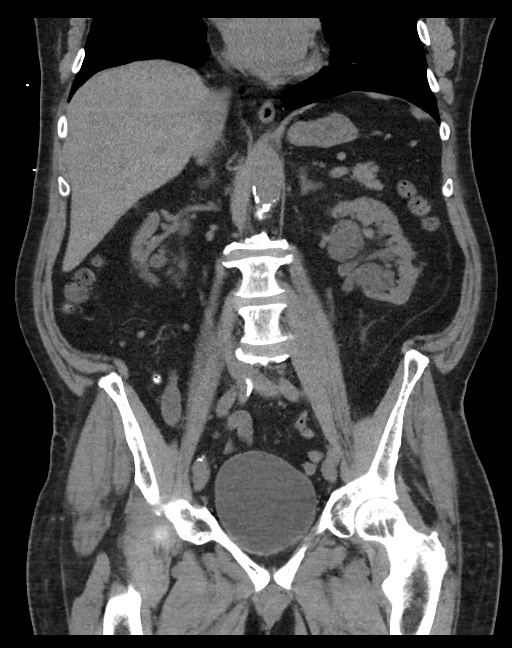

[16 of 46 positions shown; findings below may reference images not displayed]

FINDINGS: Lower chest: No acute findings.

Hepatobiliary: No focal hepatic abnormality. Gallbladder
unremarkable.

Pancreas: No focal abnormality or ductal dilatation.

Spleen: No focal abnormality.  Normal size.

Adrenals/Urinary Tract: Adrenal glands unremarkable. Bilateral renal
parapelvic cysts and cortical thinning. No hydronephrosis. Urinary
bladder unremarkable.

Stomach/Bowel: Appendix is normal. Scattered sigmoid diverticula. No
active diverticulitis. Stomach and small bowel decompressed,
unremarkable.

Vascular/Lymphatic: Aortic and iliac calcifications. No aneurysm or
adenopathy.

Reproductive: Prostate enlargement.

Other: No free fluid or free air.

Musculoskeletal: No acute bony abnormality.
IMPRESSION: Sigmoid diverticulosis.  No active diverticulitis.

Aortoiliac atherosclerosis.

Bilateral renal parapelvic cysts.

No acute findings.

## 2017-07-13 IMAGING — DX DG ABDOMEN 2V
4 series · 4 of 4 positions shown · non-contrast
Comparison: CT abdomen and pelvis 08/27/2016

CLINICAL DATA: Ileus following gastrointestinal surgery

EXAM:
ABDOMEN - 2 VIEW

[abdomen supine (1 of 2)]
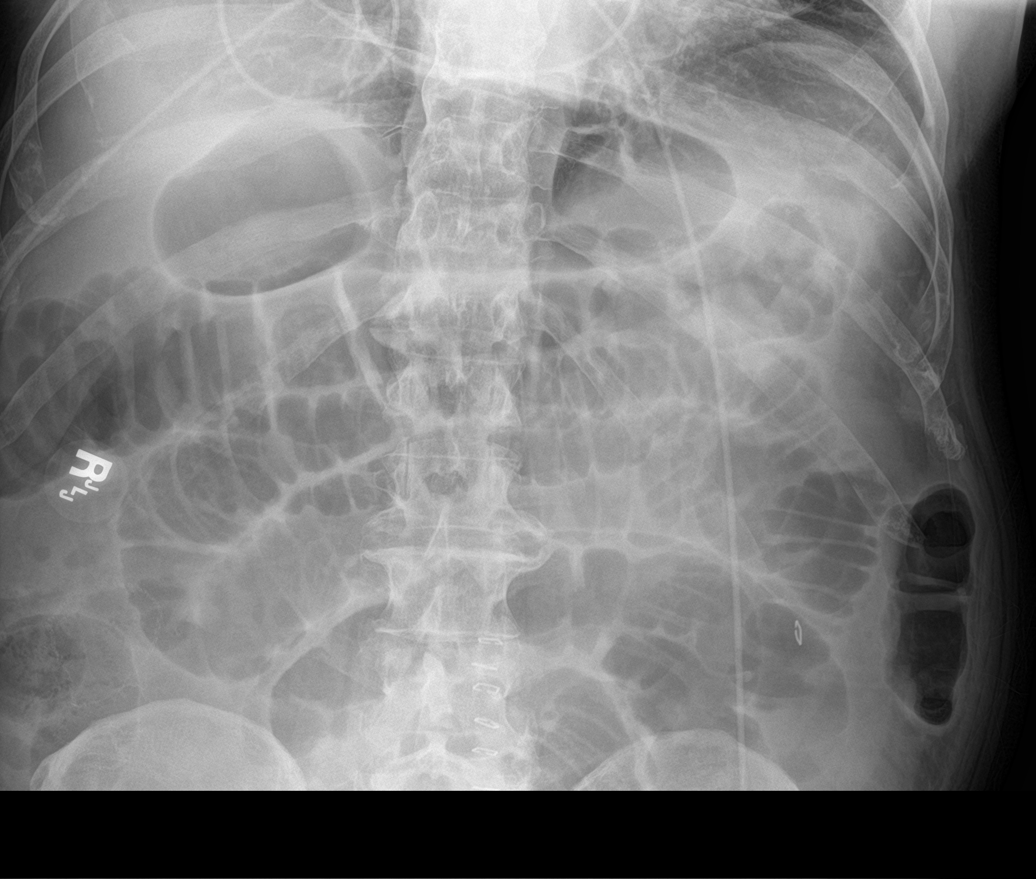

[abdomen supine (2 of 2)]
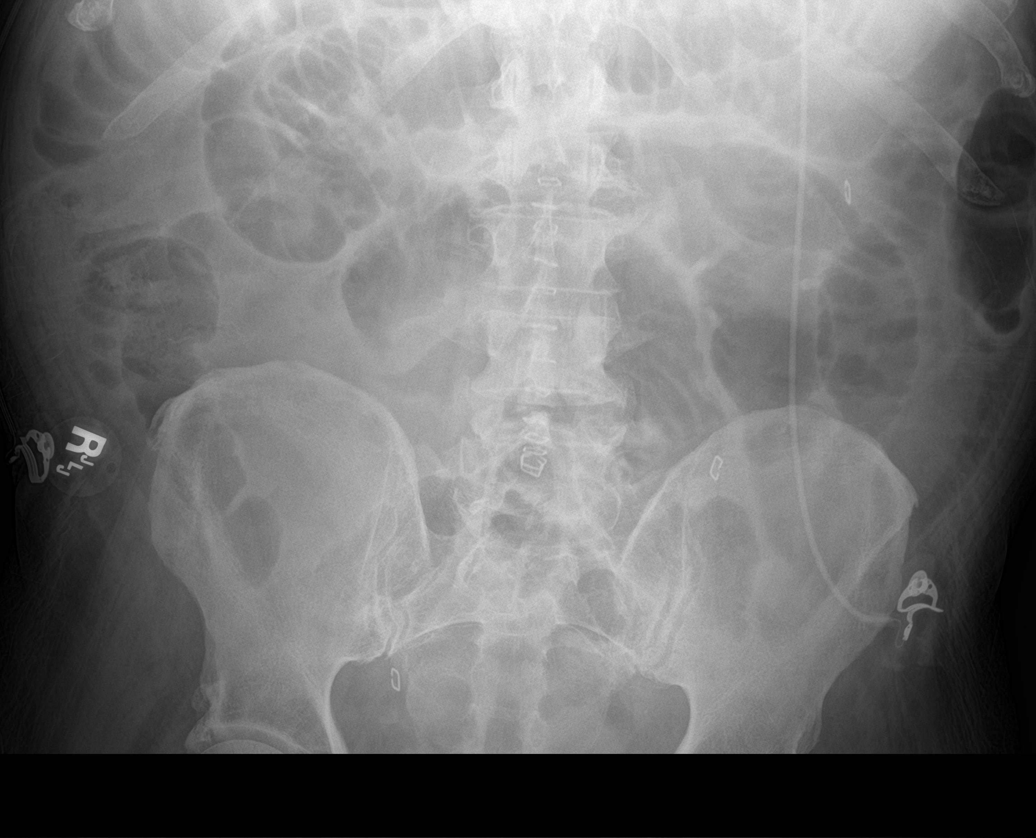

[abdomen decu (1 of 2)]
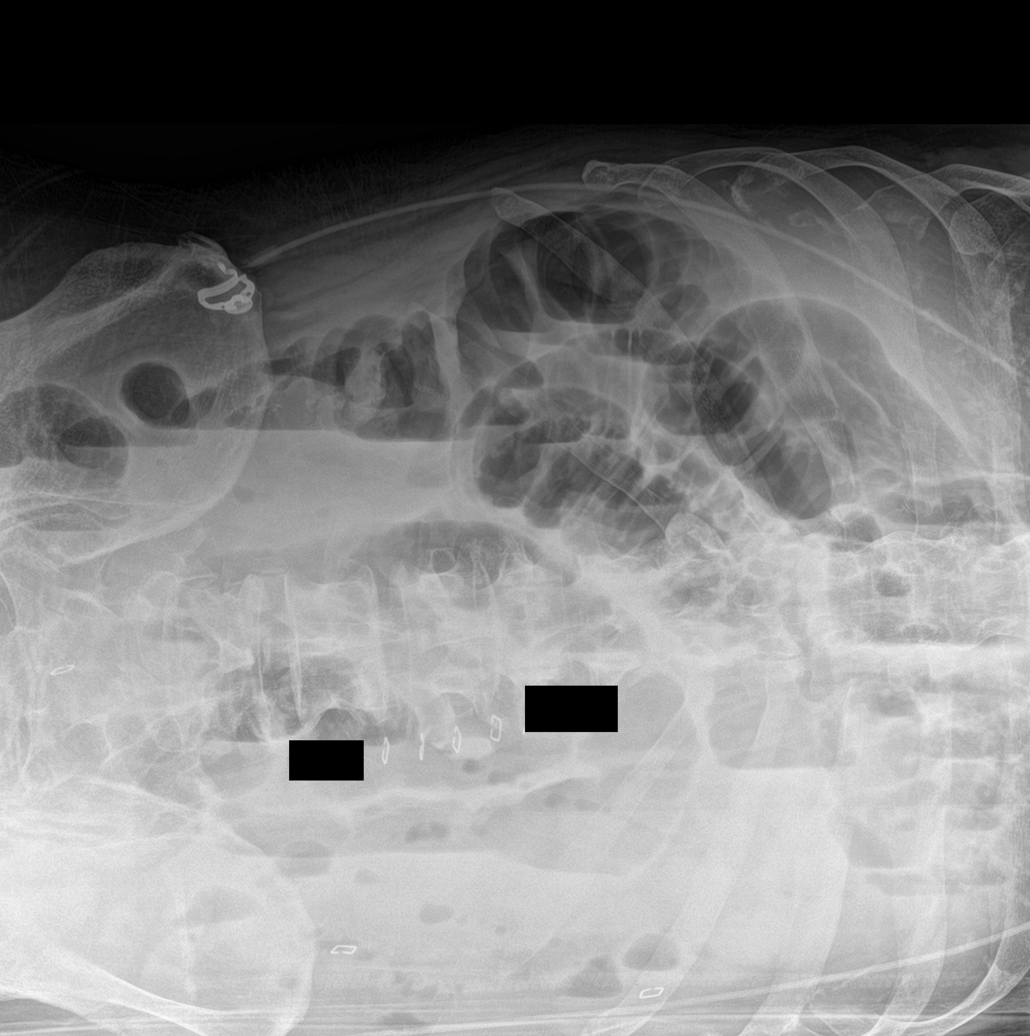

[abdomen decu (2 of 2)]
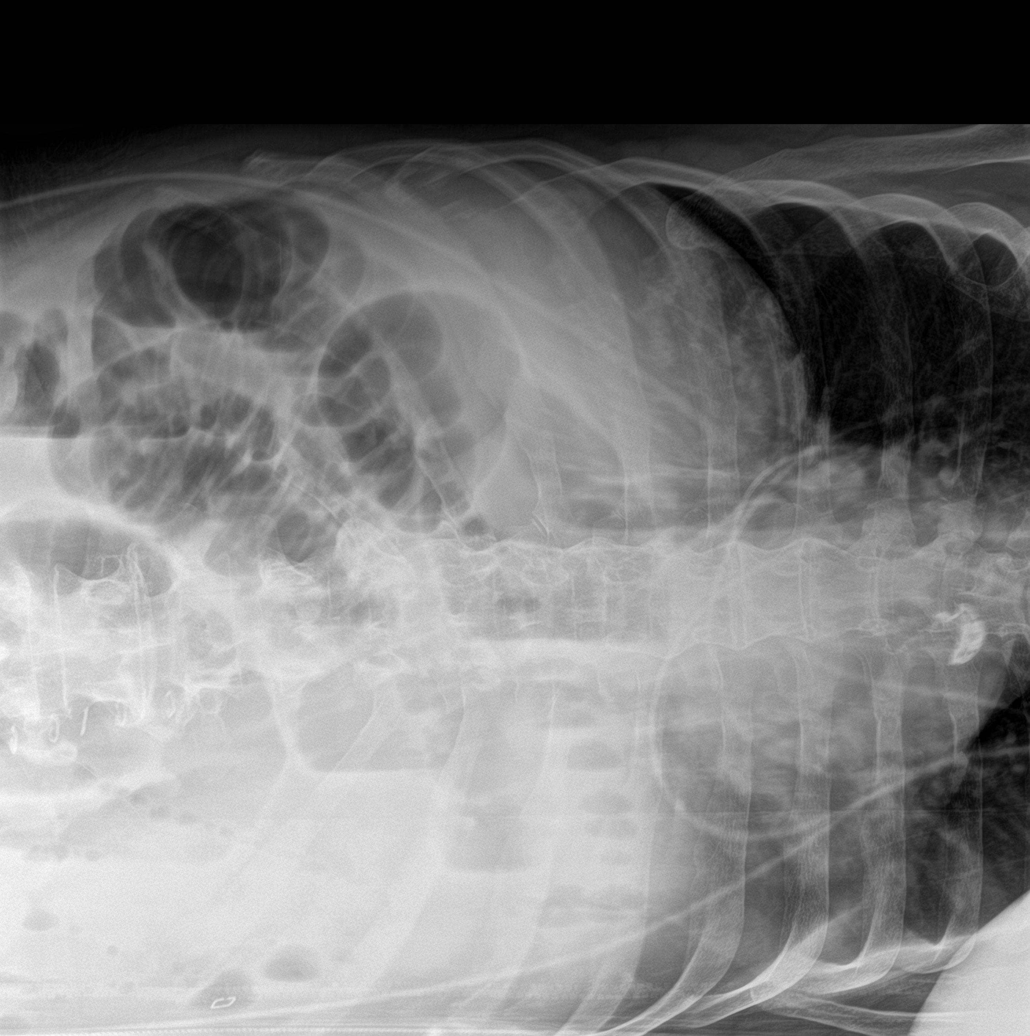

[4 of 4 positions shown; findings below may reference images not displayed]

FINDINGS: Gas within stomach.

Numerous air-filled loops of small bowel throughout abdomen.

Scattered colonic gas.

Findings are most consistent with a postoperative ileus.

No definite bowel wall thickening or free air.

Degenerative changes lumbar spine.

Diffuse osseous demineralization.

Skin clips at midline in mid abdomen.
IMPRESSION: Postoperative ileus.

## 2017-07-23 LAB — CUP PACEART REMOTE DEVICE CHECK
Implantable Pulse Generator Implant Date: 20180925
MDC IDC SESS DTM: 20190502180613

## 2017-07-30 ENCOUNTER — Other Ambulatory Visit: Payer: Medicare Other | Admitting: *Deleted

## 2017-07-30 ENCOUNTER — Ambulatory Visit (HOSPITAL_COMMUNITY): Admission: RE | Admit: 2017-07-30 | Payer: Medicare Other | Source: Ambulatory Visit

## 2017-07-30 ENCOUNTER — Ambulatory Visit (HOSPITAL_COMMUNITY)
Admission: RE | Admit: 2017-07-30 | Discharge: 2017-07-30 | Disposition: A | Discharge status: Home / Self Care | Payer: Medicare Other | Source: Ambulatory Visit | Attending: Internal Medicine | Admitting: Internal Medicine

## 2017-07-30 DIAGNOSIS — I481 Persistent atrial fibrillation: Secondary | ICD-10-CM | POA: Insufficient documentation

## 2017-07-30 DIAGNOSIS — I4819 Other persistent atrial fibrillation: Secondary | ICD-10-CM

## 2017-07-30 LAB — CBC WITH DIFFERENTIAL/PLATELET
BASOS ABS: 0 10*3/uL (ref 0.0–0.2)
Basos: 0 %
EOS (ABSOLUTE): 0.2 10*3/uL (ref 0.0–0.4)
EOS: 2 %
HEMATOCRIT: 45.8 % (ref 37.5–51.0)
HEMOGLOBIN: 16 g/dL (ref 13.0–17.7)
IMMATURE GRANS (ABS): 0 10*3/uL (ref 0.0–0.1)
IMMATURE GRANULOCYTES: 0 %
LYMPHS ABS: 1.4 10*3/uL (ref 0.7–3.1)
LYMPHS: 16 %
MCH: 32.5 pg (ref 26.6–33.0)
MCHC: 34.9 g/dL (ref 31.5–35.7)
MCV: 93 fL (ref 79–97)
MONOCYTES: 7 %
Monocytes Absolute: 0.7 10*3/uL (ref 0.1–0.9)
NEUTROS PCT: 75 %
Neutrophils Absolute: 6.7 10*3/uL (ref 1.4–7.0)
Platelets: 150 10*3/uL (ref 150–450)
RBC: 4.92 x10E6/uL (ref 4.14–5.80)
RDW: 14.4 % (ref 12.3–15.4)
WBC: 8.9 10*3/uL (ref 3.4–10.8)

## 2017-07-30 LAB — BASIC METABOLIC PANEL
BUN/Creatinine Ratio: 8 — ABNORMAL LOW (ref 10–24)
BUN: 11 mg/dL (ref 8–27)
CALCIUM: 8.8 mg/dL (ref 8.6–10.2)
CO2: 23 mmol/L (ref 20–29)
CREATININE: 1.38 mg/dL — AB (ref 0.76–1.27)
Chloride: 105 mmol/L (ref 96–106)
GFR calc Af Amer: 54 mL/min/{1.73_m2} — ABNORMAL LOW (ref >59)
GFR, EST NON AFRICAN AMERICAN: 47 mL/min/{1.73_m2} — AB (ref >59)
GLUCOSE: 114 mg/dL — AB (ref 65–99)
Potassium: 4.1 mmol/L (ref 3.5–5.2)
SODIUM: 140 mmol/L (ref 134–144)

## 2017-07-30 LAB — POCT I-STAT CREATININE: CREATININE: 1.2 mg/dL (ref 0.61–1.24)

## 2017-07-30 MED ORDER — IOPAMIDOL (ISOVUE-370) INJECTION 76%
INTRAVENOUS | Status: AC
  Administered 2017-07-30: 100 mL
  Filled 2017-07-30: qty 100

## 2017-07-31 ENCOUNTER — Ambulatory Visit (INDEPENDENT_AMBULATORY_CARE_PROVIDER_SITE_OTHER): Payer: Medicare Other | Admitting: *Deleted

## 2017-07-31 DIAGNOSIS — I481 Persistent atrial fibrillation: Secondary | ICD-10-CM

## 2017-07-31 DIAGNOSIS — I4819 Other persistent atrial fibrillation: Secondary | ICD-10-CM

## 2017-07-31 NOTE — Progress Notes (Signed)
Carelink Summary Report / Loop Recorder 

## 2017-08-02 ENCOUNTER — Encounter (HOSPITAL_COMMUNITY): Payer: Self-pay | Admitting: General Practice

## 2017-08-02 ENCOUNTER — Ambulatory Visit (HOSPITAL_COMMUNITY): Payer: Medicare Other | Admitting: Anesthesiology

## 2017-08-02 ENCOUNTER — Encounter (HOSPITAL_COMMUNITY)
Admission: RE | Disposition: A | Discharge status: Home / Self Care | Payer: Self-pay | Source: Ambulatory Visit | Attending: Internal Medicine

## 2017-08-02 ENCOUNTER — Ambulatory Visit (HOSPITAL_COMMUNITY)
Admission: RE | Admit: 2017-08-02 | Discharge: 2017-08-03 | Disposition: A | Discharge status: Home / Self Care | Payer: Medicare Other | Source: Ambulatory Visit | Attending: Internal Medicine | Admitting: Internal Medicine

## 2017-08-02 DIAGNOSIS — I4891 Unspecified atrial fibrillation: Secondary | ICD-10-CM | POA: Diagnosis not present

## 2017-08-02 DIAGNOSIS — M1990 Unspecified osteoarthritis, unspecified site: Secondary | ICD-10-CM | POA: Insufficient documentation

## 2017-08-02 DIAGNOSIS — Z7901 Long term (current) use of anticoagulants: Secondary | ICD-10-CM | POA: Diagnosis not present

## 2017-08-02 DIAGNOSIS — G4733 Obstructive sleep apnea (adult) (pediatric): Secondary | ICD-10-CM | POA: Diagnosis not present

## 2017-08-02 DIAGNOSIS — E785 Hyperlipidemia, unspecified: Secondary | ICD-10-CM | POA: Diagnosis not present

## 2017-08-02 DIAGNOSIS — I481 Persistent atrial fibrillation: Secondary | ICD-10-CM | POA: Diagnosis not present

## 2017-08-02 DIAGNOSIS — I4819 Other persistent atrial fibrillation: Secondary | ICD-10-CM | POA: Diagnosis present

## 2017-08-02 DIAGNOSIS — D649 Anemia, unspecified: Secondary | ICD-10-CM | POA: Diagnosis not present

## 2017-08-02 DIAGNOSIS — I4892 Unspecified atrial flutter: Secondary | ICD-10-CM | POA: Insufficient documentation

## 2017-08-02 DIAGNOSIS — I10 Essential (primary) hypertension: Secondary | ICD-10-CM | POA: Insufficient documentation

## 2017-08-02 DIAGNOSIS — G4730 Sleep apnea, unspecified: Secondary | ICD-10-CM | POA: Diagnosis not present

## 2017-08-02 HISTORY — PX: ATRIAL FIBRILLATION ABLATION: EP1191

## 2017-08-02 LAB — POCT ACTIVATED CLOTTING TIME
ACTIVATED CLOTTING TIME: 318 s
Activated Clotting Time: 285 seconds
Activated Clotting Time: 367 seconds
Activated Clotting Time: 180 seconds

## 2017-08-02 SURGERY — ATRIAL FIBRILLATION ABLATION
Anesthesia: General

## 2017-08-02 MED ORDER — DEXAMETHASONE SODIUM PHOSPHATE 10 MG/ML IJ SOLN
INTRAMUSCULAR | Status: DC | PRN
  Administered 2017-08-02: 10 mg via INTRAVENOUS

## 2017-08-02 MED ORDER — HEPARIN (PORCINE) IN NACL 2-0.9 UNITS/ML
INTRAMUSCULAR | Status: AC | PRN
  Administered 2017-08-02: 500 mL

## 2017-08-02 MED ORDER — FENTANYL CITRATE (PF) 100 MCG/2ML IJ SOLN
INTRAMUSCULAR | Status: DC | PRN
  Administered 2017-08-02: 75 ug via INTRAVENOUS
  Administered 2017-08-02: 25 ug via INTRAVENOUS

## 2017-08-02 MED ORDER — HEPARIN SODIUM (PORCINE) 1000 UNIT/ML IJ SOLN
INTRAMUSCULAR | Status: DC | PRN
  Administered 2017-08-02 (×2): 1000 [IU] via INTRAVENOUS
  Administered 2017-08-02: 12000 [IU] via INTRAVENOUS

## 2017-08-02 MED ORDER — PROPOFOL 10 MG/ML IV BOLUS
INTRAVENOUS | Status: DC | PRN
  Administered 2017-08-02: 125 mg via INTRAVENOUS
  Administered 2017-08-02: 30 mg via INTRAVENOUS

## 2017-08-02 MED ORDER — PROTAMINE SULFATE 10 MG/ML IV SOLN
INTRAVENOUS | Status: DC | PRN
  Administered 2017-08-02: 10 mg via INTRAVENOUS
  Administered 2017-08-02: 20 mg via INTRAVENOUS
  Administered 2017-08-02: 10 mg via INTRAVENOUS

## 2017-08-02 MED ORDER — HEPARIN (PORCINE) IN NACL 1000-0.9 UT/500ML-% IV SOLN
INTRAVENOUS | Status: AC
  Filled 2017-08-02: qty 500

## 2017-08-02 MED ORDER — IOPAMIDOL (ISOVUE-370) INJECTION 76%
INTRAVENOUS | Status: AC
  Filled 2017-08-02: qty 50

## 2017-08-02 MED ORDER — MIDAZOLAM HCL 5 MG/5ML IJ SOLN
INTRAMUSCULAR | Status: DC | PRN
  Administered 2017-08-02: 0.5 mg via INTRAVENOUS

## 2017-08-02 MED ORDER — ONDANSETRON HCL 4 MG/2ML IJ SOLN
INTRAMUSCULAR | Status: DC | PRN
  Administered 2017-08-02: 4 mg via INTRAVENOUS

## 2017-08-02 MED ORDER — LIDOCAINE HCL (PF) 1 % IJ SOLN: INTRAMUSCULAR | Status: DC | PRN

## 2017-08-02 MED ORDER — BUPIVACAINE HCL (PF) 0.25 % IJ SOLN
INTRAMUSCULAR | Status: DC | PRN
  Administered 2017-08-02: 30 mL

## 2017-08-02 MED ORDER — ONDANSETRON HCL 4 MG/2ML IJ SOLN: 4.0000 mg | Freq: Four times a day (QID) | INTRAMUSCULAR | Status: DC | PRN

## 2017-08-02 MED ORDER — SUGAMMADEX SODIUM 500 MG/5ML IV SOLN
INTRAVENOUS | Status: DC | PRN
  Administered 2017-08-02: 300 mg via INTRAVENOUS

## 2017-08-02 MED ORDER — SODIUM CHLORIDE 0.9% FLUSH: 3.0000 mL | INTRAVENOUS | Status: DC | PRN

## 2017-08-02 MED ORDER — HYDROCODONE-ACETAMINOPHEN 5-325 MG PO TABS: 1.0000 | ORAL_TABLET | ORAL | Status: DC | PRN

## 2017-08-02 MED ORDER — SODIUM CHLORIDE 0.9 % IV SOLN: 250.0000 mL | INTRAVENOUS | Status: DC | PRN

## 2017-08-02 MED ORDER — FERROUS SULFATE 325 (65 FE) MG PO TABS
325.0000 mg | ORAL_TABLET | Freq: Every day | ORAL | Status: DC
  Administered 2017-08-03: 325 mg via ORAL
  Filled 2017-08-02: qty 1

## 2017-08-02 MED ORDER — PHENYLEPHRINE 40 MCG/ML (10ML) SYRINGE FOR IV PUSH (FOR BLOOD PRESSURE SUPPORT)
PREFILLED_SYRINGE | INTRAVENOUS | Status: DC | PRN
  Administered 2017-08-02 (×2): 40 ug via INTRAVENOUS
  Administered 2017-08-02: 80 ug via INTRAVENOUS

## 2017-08-02 MED ORDER — PHENYLEPHRINE HCL 10 MG/ML IJ SOLN
INTRAVENOUS | Status: DC | PRN
  Administered 2017-08-02: 20 ug/min via INTRAVENOUS

## 2017-08-02 MED ORDER — SODIUM CHLORIDE 0.9 % IV SOLN
INTRAVENOUS | Status: DC
  Administered 2017-08-02 (×2): via INTRAVENOUS

## 2017-08-02 MED ORDER — CARVEDILOL 6.25 MG PO TABS
6.2500 mg | ORAL_TABLET | Freq: Two times a day (BID) | ORAL | Status: DC
  Administered 2017-08-03: 6.25 mg via ORAL
  Filled 2017-08-02: qty 1

## 2017-08-02 MED ORDER — HEPARIN SODIUM (PORCINE) 1000 UNIT/ML IJ SOLN
INTRAMUSCULAR | Status: AC
  Filled 2017-08-02: qty 1

## 2017-08-02 MED ORDER — RIVAROXABAN 20 MG PO TABS
20.0000 mg | ORAL_TABLET | Freq: Every day | ORAL | Status: DC
  Administered 2017-08-02: 20 mg via ORAL
  Filled 2017-08-02: qty 1

## 2017-08-02 MED ORDER — BUPIVACAINE HCL (PF) 0.25 % IJ SOLN
INTRAMUSCULAR | Status: AC
  Filled 2017-08-02: qty 30

## 2017-08-02 MED ORDER — IOPAMIDOL (ISOVUE-370) INJECTION 76%
INTRAVENOUS | Status: DC | PRN
  Administered 2017-08-02: 3 mL

## 2017-08-02 MED ORDER — HEPARIN SODIUM (PORCINE) 1000 UNIT/ML IJ SOLN
INTRAMUSCULAR | Status: DC | PRN
  Administered 2017-08-02 (×2): 4000 [IU] via INTRAVENOUS

## 2017-08-02 MED ORDER — SODIUM CHLORIDE 0.9% FLUSH
3.0000 mL | Freq: Two times a day (BID) | INTRAVENOUS | Status: DC
  Administered 2017-08-02 – 2017-08-03 (×2): 3 mL via INTRAVENOUS

## 2017-08-02 MED ORDER — ACETAMINOPHEN 325 MG PO TABS
650.0000 mg | ORAL_TABLET | ORAL | Status: DC | PRN
  Administered 2017-08-02: 650 mg via ORAL
  Filled 2017-08-02: qty 2

## 2017-08-02 MED ORDER — ROCURONIUM BROMIDE 100 MG/10ML IV SOLN
INTRAVENOUS | Status: DC | PRN
  Administered 2017-08-02: 20 mg via INTRAVENOUS
  Administered 2017-08-02: 70 mg via INTRAVENOUS

## 2017-08-02 MED ORDER — LIDOCAINE HCL (CARDIAC) PF 100 MG/5ML IV SOSY
PREFILLED_SYRINGE | INTRAVENOUS | Status: DC | PRN
  Administered 2017-08-02: 50 mg via INTRATRACHEAL

## 2017-08-02 SURGICAL SUPPLY — 17 items
BLANKET WARM UNDERBOD FULL ACC (MISCELLANEOUS) ×3 IMPLANT
CATH MAPPNG PENTARAY F 2-6-2MM (CATHETERS) ×1 IMPLANT
CATH NAVISTAR SMARTTOUCH DF (ABLATOR) ×3 IMPLANT
CATH SOUNDSTAR 3D IMAGING (CATHETERS) ×3 IMPLANT
CATH WEBSTER BI DIR CS D-F CRV (CATHETERS) ×3 IMPLANT
COVER SWIFTLINK CONNECTOR (BAG) ×3 IMPLANT
NEEDLE BAYLIS TRANSSEPTAL 71CM (NEEDLE) ×3 IMPLANT
PACK EP LATEX FREE (CUSTOM PROCEDURE TRAY) ×2
PACK EP LF (CUSTOM PROCEDURE TRAY) ×1 IMPLANT
PAD DEFIB LIFELINK (PAD) ×3 IMPLANT
PATCH CARTO3 (PAD) ×3 IMPLANT
PENTARAY F 2-6-2MM (CATHETERS) ×3
SHEATH AVANTI 11CM 7FR (SHEATH) ×6 IMPLANT
SHEATH AVANTI 11F 11CM (SHEATH) ×3 IMPLANT
SHEATH PINNACLE 9F 10CM (SHEATH) ×3 IMPLANT
SHEATH SWARTZ TS SL2 63CM 8.5F (SHEATH) ×3 IMPLANT
TUBING SMART ABLATE COOLFLOW (TUBING) ×3 IMPLANT

## 2017-08-02 NOTE — H&P (Addendum)
PCP: Manon Hilding, MD   Primary EP: Dr Teressa Senter is a 84 y.o. male who presents today for routine electrophysiology followup.  Since last being seen in our clinic, the patient reports doing reasonably well.  He has had increasing afib.  + palpitations and fatigue.  He also reports decreased exercise tolerance.  Today, he denies symptoms of chest pain, shortness of breath,  lower extremity edema, dizziness, presyncope, or syncope.  The patient is otherwise without complaint today.       Past Medical History:  Diagnosis Date  . Arthritis   . Atrial fibrillation (HCC)    paroxysmal, s/p PVI by Dr Rayann Heman 8/12  . Atrial flutter ([place])    s/p CTI ablation  . Cancer [facility])    4th stage dysplasia mass in colon-removed 7/18  . Dysrhythmia    A. fib/a. a. flutter  . Hyperlipidemia   . Obstructive sleep apnea (adult) (pediatric)    does not use cpap        Past Surgical History:  Procedure Laterality Date  . ATRIAL ABLATION SURGERY     s/p CTI and PVI ablations by Dr Rayann Heman 8/12  . CARDIAC CATHETERIZATION     nonobstructive CAD 2000  . COLONOSCOPY WITH PROPOFOL N/A 08/24/2016   Procedure: COLONOSCOPY WITH PROPOFOL;  Surgeon: Teena Irani, MD;  Location: [facility];  Service: Endoscopy;  Laterality: N/A;  . INSERTION OF MESH N/A 02/28/2017   Procedure: INSERTION OF MESH;  Surgeon: Jovita Kussmaul, MD;  Location: [place];  Service: General;  Laterality: N/A;  . LAPAROSCOPIC RIGHT COLECTOMY Right 08/28/2016   Procedure: LAPAROSCOPIC ASSISTED RIGHT COLECTOMY;  Surgeon: Jovita Kussmaul, MD;  Location: [facility];  Service: General;  Laterality: Right;  . LOOP RECORDER INSERTION N/A 11/21/2016   Procedure: LOOP RECORDER INSERTION;  Surgeon: [name], MD;  Location: [place] CV LAB;  Service: Cardiovascular;  Laterality: N/A;  . melanoma (other)     resected from his right shoulder  . VENTRAL HERNIA REPAIR  02/28/2017  . VENTRAL HERNIA REPAIR N/A 02/28/2017    Procedure: LAPAROSCOPIC VENTRAL HERNIA REPAIR WITH MESH;  Surgeon: Jovita Kussmaul, MD;  Location: [place];  Service: General;  Laterality: N/A;    ROS- all systems are reviewed and negatives except as per HPI above        Current Outpatient Medications  Medication Sig Dispense Refill  . acetaminophen (TYLENOL) 325 MG tablet Take 325 mg by mouth every 6 (six) hours as needed for mild pain.    Marland Kitchen allopurinol (ZYLOPRIM) 100 MG tablet Take 100 mg by mouth daily.     . carvedilol (COREG) 6.25 MG tablet Take 1 tablet (6.25 mg total) by mouth 2 (two) times daily. 180 tablet 3  . colchicine 0.6 MG tablet Take 0.6 mg by mouth 2 (two) times daily as needed (Gout).     . ferrous sulfate 325 (65 FE) MG tablet Take 325 mg by mouth daily with breakfast.    . Multiple Vitamins-Minerals (MULTIVITAMIN WITH MINERALS) tablet Take 1 tablet by mouth daily.    . rivaroxaban (XARELTO) 20 MG TABS tablet Take 1 tablet (20 mg total) by mouth daily with supper. 90 tablet 1   No current facility-administered medications for this visit.     Physical Exam:    BP (!) 116/99   Pulse 74   Temp 98 F (36.7 C) (Oral)   Resp 18   Ht 5\' 10"  (1.778 m)   Wt 200 lb (  90.7 kg)   SpO2 97%   BMI 28.70 kg/m   GEN- The patient is elderly appearing, alert and oriented x 3 today.   Head- normocephalic, atraumatic Eyes-  Sclera clear, conjunctiva pink Ears- hearing intact Oropharynx- clear Lungs- Clear to ausculation bilaterally, normal work of breathing Heart- Irregular rate and rhythm  GI- soft, NT, ND, + BS Extremities- no clubbing, cyanosis, or edema   Assessment and Plan:  1. Persistent afib/ atrial flutter He has not previously tolerated amiodarone, rhythmol, or diltiazem.   CHADS2VASC is 3 - he reports compliance with Xarelto without missed doses for the last 3 weeks.  Therapeutic strategies for afib including medicine and ablation were discussed in detail with the patient today. He is very  reluctant to consider any new AAD options such as multaq or tikosyn.  Risk, benefits, and alternatives to repeat EP study and radiofrequency ablation for afib were also discussed in detail today. These risks include but are not limited to stroke, bleeding, vascular damage, tamponade, perforation, damage to the esophagus, lungs, and other structures, pulmonary vein stenosis, worsening renal function, and death. The patient understands these risk and wishes to proceed.  Cardiac CT scan reviewed  2. HTN Stable No change required today  Chanetta Marshall, NP 08/02/2017 9:24 AM   The patient has symptomatic, recurrent persistent atrial fibrillation. he has failed medical therapy with multiple AADs previously.  He did very well s/p PVI/CTI ablation in 2012. Chads2vasc score is 3.  he is anticoagulated with xarelto . Therapeutic strategies for afib including medicine and ablation were discussed in detail with the patient today. Risk, benefits, and alternatives to EP study and radiofrequency ablation for afib were also discussed in detail today. These risks include but are not limited to stroke, bleeding, vascular damage, tamponade, perforation, damage to the esophagus, lungs, and other structures, pulmonary vein stenosis, worsening renal function, and death. The patient understands these risk and wishes to proceed.  We will therefore proceed with catheter ablation at this time.  [name] MD, [facility] 08/02/2017 10:59 AM

## 2017-08-02 NOTE — Progress Notes (Signed)
Site area: Right groin a 7, 9, and 11 french venous sheath was removed  Site Prior to Removal:  Level 0  Pressure Applied For 20 MINUTES    Bedrest Beginning at 1530p  Manual:   Yes.    Patient Status During Pull:  stable  Post Pull Groin Site:  Level 0  Post Pull Instructions Given:  Yes.    Post Pull Pulses Present:  Yes.    Dressing Applied:  Yes.    Comments:  VS remain stable

## 2017-08-02 NOTE — Progress Notes (Signed)
Left lower lip has cold sore lesion, scabbed over.

## 2017-08-02 NOTE — Transfer of Care (Signed)
Immediate Anesthesia Transfer of Care Note  Patient: Bradley Short  Procedure(s) Performed: ATRIAL FIBRILLATION ABLATION (N/A )  Patient Location: Cath Lab  Anesthesia Type:General  Level of Consciousness: awake, alert  and oriented  Airway & Oxygen Therapy: Patient Spontanous Breathing and Patient connected to nasal cannula oxygen  Post-op Assessment: Report given to RN, Post -op Vital signs reviewed and stable and Patient moving all extremities X 4  Post vital signs: Reviewed and stable  Last Vitals:  Vitals Value Taken Time  BP 100/49 08/02/2017  2:02 PM  Temp 36.4 C 08/02/2017  1:57 PM  Pulse 69 08/02/2017  2:02 PM  Resp 14 08/02/2017  2:02 PM  SpO2 100 % 08/02/2017  2:02 PM  Vitals shown include unvalidated device data.  Last Pain:  Vitals:   08/02/17 1357  TempSrc:   PainSc: 0-No pain         Complications: No apparent anesthesia complications

## 2017-08-02 NOTE — Anesthesia Procedure Notes (Signed)
Procedure Name: Intubation Date/Time: 08/02/2017 11:18 AM Performed by: Mariea Clonts, CRNA Pre-anesthesia Checklist: Patient identified, Emergency Drugs available, Suction available and Patient being monitored Patient Re-evaluated:Patient Re-evaluated prior to induction Oxygen Delivery Method: Circle System Utilized Preoxygenation: Pre-oxygenation with 100% oxygen Induction Type: IV induction Ventilation: Mask ventilation without difficulty Laryngoscope Size: Miller and 2 Grade View: Grade I Tube type: Oral Number of attempts: 1 Airway Equipment and Method: Stylet and Oral airway Placement Confirmation: ETT inserted through vocal cords under direct vision,  positive ETCO2 and breath sounds checked- equal and bilateral Tube secured with: Tape Dental Injury: Teeth and Oropharynx as per pre-operative assessment

## 2017-08-02 NOTE — Anesthesia Preprocedure Evaluation (Addendum)
Anesthesia Evaluation  Patient identified by MRN, date of birth, ID band Patient awake    Reviewed: Allergy & Precautions, H&P , NPO status , Patient's Chart, lab work & pertinent test results  History of Anesthesia Complications (+) PONV  Airway Mallampati: I  TM Distance: <3 FB Neck ROM: Full    Dental  (+) Teeth Intact   Pulmonary sleep apnea ,  Does not use CPAP   Pulmonary exam normal        Cardiovascular Exercise Tolerance: Good hypertension, (-) CAD + dysrhythmias Atrial Fibrillation  Rhythm:Regular Rate:Normal  Pt had an ablation in past. Pt on Xarelto.  Echo 6/18  Study Conclusions Procedure narrative: Transthoracic echocardiography. Image   quality was poor. The study was technically difficult, as a   result of poor sound wave transmission. - Left ventricle: The cavity size was normal. Wall thickness was   increased in a pattern of mild LVH. Systolic function was normal.   The estimated ejection fraction was in the range of 60% to 65%.   Although no diagnostic regional wall motion abnormality was   identified, this possibility cannot be completely excluded on the   basis of this study. Features are consistent with a pseudonormal   left ventricular filling pattern, with concomitant abnormal   relaxation and increased filling pressure (grade 2 diastolic   dysfunction).   Neuro/Psych    GI/Hepatic   Endo/Other    Renal/GU      Musculoskeletal  (+) Arthritis ,   Abdominal   Peds  Hematology  (+) anemia ,   Anesthesia Other Findings   Reproductive/Obstetrics                            Anesthesia Physical  Anesthesia Plan  ASA: III  Anesthesia Plan: General   Post-op Pain Management:    Induction: Intravenous  PONV Risk Score and Plan: 3 and Ondansetron, Dexamethasone and Treatment may vary due to age or medical condition  Airway Management Planned: Oral  ETT  Additional Equipment:   Intra-op Plan:   Post-operative Plan: Extubation in OR  Informed Consent: I have reviewed the patients History and Physical, chart, labs and discussed the procedure including the risks, benefits and alternatives for the proposed anesthesia with the patient or authorized representative who has indicated his/her understanding and acceptance.     Plan Discussed with: CRNA, Anesthesiologist and Surgeon  Anesthesia Plan Comments: (  )        Anesthesia Quick Evaluation

## 2017-08-02 NOTE — Anesthesia Postprocedure Evaluation (Signed)
Anesthesia Post Note  Patient: Bradley Short  Procedure(s) Performed: ATRIAL FIBRILLATION ABLATION (N/A )     Patient location during evaluation: PACU Anesthesia Type: General Level of consciousness: awake and alert Pain management: pain level controlled Vital Signs Assessment: post-procedure vital signs reviewed and stable Respiratory status: spontaneous breathing, nonlabored ventilation, respiratory function stable and patient connected to nasal cannula oxygen Cardiovascular status: blood pressure returned to baseline and stable Postop Assessment: no apparent nausea or vomiting Anesthetic complications: no    Last Vitals:  Vitals:   08/02/17 1557 08/02/17 1947  BP: 110/74 109/74  Pulse: 77 88  Resp:  17  Temp: 36.6 C 36.7 C  SpO2: 100% 98%    Last Pain:  Vitals:   08/02/17 1947  TempSrc: Oral  PainSc:                  Tahlia Deamer

## 2017-08-03 ENCOUNTER — Encounter (HOSPITAL_COMMUNITY): Payer: Self-pay | Admitting: Internal Medicine

## 2017-08-03 DIAGNOSIS — E785 Hyperlipidemia, unspecified: Secondary | ICD-10-CM | POA: Diagnosis not present

## 2017-08-03 DIAGNOSIS — Z7901 Long term (current) use of anticoagulants: Secondary | ICD-10-CM | POA: Diagnosis not present

## 2017-08-03 DIAGNOSIS — M1990 Unspecified osteoarthritis, unspecified site: Secondary | ICD-10-CM | POA: Diagnosis not present

## 2017-08-03 DIAGNOSIS — I10 Essential (primary) hypertension: Secondary | ICD-10-CM | POA: Diagnosis not present

## 2017-08-03 DIAGNOSIS — I4892 Unspecified atrial flutter: Secondary | ICD-10-CM | POA: Diagnosis not present

## 2017-08-03 DIAGNOSIS — I481 Persistent atrial fibrillation: Secondary | ICD-10-CM | POA: Diagnosis not present

## 2017-08-03 DIAGNOSIS — G4733 Obstructive sleep apnea (adult) (pediatric): Secondary | ICD-10-CM | POA: Diagnosis not present

## 2017-08-03 MED ORDER — PANTOPRAZOLE SODIUM 40 MG PO TBEC: 40.0000 mg | DELAYED_RELEASE_TABLET | Freq: Every day | ORAL | 0 refills | Status: DC

## 2017-08-03 NOTE — Discharge Instructions (Signed)
No driving for 4 days. No lifting over 5 lbs for 1 week. No sexual activity for 1 week. You may return to work in 1 week. Keep procedure site clean & dry. If you notice increased pain, swelling, bleeding or pus, call/return!  You may shower, but no soaking baths/hot tubs/pools for 1 week.  ° ° °You have an appointment set up with the Atrial Fibrillation Clinic.  Multiple studies have shown that being followed by a dedicated atrial fibrillation clinic in addition to the standard care you receive from your other physicians improves health. We believe that enrollment in the atrial fibrillation clinic will allow us to better care for you.  ° °The phone number to the Atrial Fibrillation Clinic is [phone]. The clinic is staffed Monday through Friday from 8:30am to 5pm. ° °Parking Directions: The clinic is located in the Heart and Vascular Building connected to [facility]. °1)From [address] turn on to [address] and go to the 3rd entrance  (Heart and Vascular entrance) on the right. °2)Look to the right for Heart &Vascular Parking Garage. °3)A code for the entrance is required please call the clinic to receive this.   °4)Take the elevators to the 1st floor. Registration is in the room with the glass walls at the end of the hallway. ° °If you have any trouble parking or locating the clinic, please don’t hesitate to call [phone]. ° ° °

## 2017-08-03 NOTE — Discharge Summary (Addendum)
ELECTROPHYSIOLOGY PROCEDURE DISCHARGE SUMMARY    Patient ID: Bradley Short,  MRN: [id number], DOB/AGE: Mar 17, 1932 84 y.o.  Admit date: 08/02/2017 Discharge date: 08/03/2017  Primary Care Physician: Manon Hilding, MD Electrophysiologist: [name], MD  Primary Discharge Diagnosis:  Persistent atrial fibrillation status post ablation this admission  Secondary Discharge Diagnosis:  1.  Hyperlipidemia 2. OSA  Procedures This Admission:  1.  Electrophysiology study and radiofrequency catheter ablation on 08/02/17 by Dr [name].  This study demonstrated atrial fibrillation upon presentation; intracardiac echo reveals a moderate sized left atrium with four separate pulmonary veins without evidence of pulmonary vein stenosis; return of electrical activity within only the left superior pulmonary vein at baseline.  The left inferior, right superior, and right inferior pulmonary veins were quiescent from the prior ablation; as the prior ablation was quite antral, I elected to perform additional ablation around all 4 PVs today using a WACA approach; additional left atrial ablation was performed with a standard box lesion created along the posterior wall of the left atrium; atrial fibrillation successfully cardioverted to sinus rhythm; no early apparent complications..    Brief HPI: Bradley Short is a 84 y.o. male with a history of persistent atrial fibrillation.  Risks, benefits, and alternatives to repeat catheter ablation of atrial fibrillation were reviewed with the patient who wished to proceed.  The patient underwent cardiac CT prior to the procedure which demonstrated no LAA thrombus.    Hospital Course:  The patient was admitted and underwent EPS/RFCA of atrial fibrillation with details as outlined above.  They were monitored on telemetry overnight which demonstrated SR with runs of AF.  Groin was without complication on the day of discharge.  The patient was examined and considered to  be stable for discharge.  Wound care and restrictions were reviewed with the patient.  The patient will be seen back by Roderic Palau, NP in 4 weeks and Dr Rayann Heman in 12 weeks for post ablation follow up.   This patients CHA2DS2-VASc Score and unadjusted Ischemic Stroke Rate (% per year) is equal to 2.2 % stroke rate/year from a score of 2 Above score calculated as 1 point each if present [CHF, HTN, DM, Vascular=MI/PAD/Aortic Plaque, Age if 65-74, or Male] Above score calculated as 2 points each if present [Age > 75, or Stroke/TIA/TE]   Physical Exam: Vitals:   08/02/17 1530 08/02/17 1557 08/02/17 1947 08/03/17 0625  BP: (!) 111/42 110/74 109/74 (!) 121/58  Pulse: (!) 50 77 88 (!) 57  Resp: 19  17 20   Temp:  97.9 F (36.6 C) 98.1 F (36.7 C) 98 F (36.7 C)  TempSrc:  Oral Oral Oral  SpO2: 100% 100% 98% 100%  Weight:      Height:        GEN- The patient is well appearing, alert and oriented x 3 today.   HEENT: normocephalic, atraumatic; sclera clear, conjunctiva pink; hearing intact; oropharynx clear; neck supple  Lungs- Clear to ausculation bilaterally, normal work of breathing.  No wheezes, rales, rhonchi Heart- Regular rate and rhythm, no murmurs, rubs or gallops  GI- soft, non-tender, non-distended, bowel sounds present  Extremities- no clubbing, cyanosis, or edema; DP/PT/radial pulses 2+ bilaterally, groin without hematoma/bruit MS- no significant deformity or atrophy Skin- warm and dry, no rash or lesion Psych- euthymic mood, full affect Neuro- strength and sensation are intact   Labs:   Lab Results  Component Value Date   WBC 8.9 07/30/2017   HGB 16.0 07/30/2017   HCT  45.8 07/30/2017   MCV 93 07/30/2017   PLT 150 07/30/2017    Recent Labs  Lab 07/30/17 0821 07/30/17 0910  NA 140  --   K 4.1  --   CL 105  --   CO2 23  --   BUN 11  --   CREATININE 1.38* 1.20  CALCIUM 8.8  --   GLUCOSE 114*  --      Discharge Medications:  Allergies as of 08/03/2017       Reactions   Amiodarone    Developed severe drug rash while on Amiodarone and Diltiazem unclear which one of them was culprit   Diltiazem Hcl    REACTION: Looked like burned all over   Lisinopril    REACTION: Looked like burned all over   Metoprolol Succinate    REACTION: Looked like burned all over   Propafenone Hcl    REACTION: Looked like burned all over   Robaxin [methocarbamol]    Rash all over      Medication List    TAKE these medications   acetaminophen 325 MG tablet Commonly known as:  TYLENOL Take 325 mg by mouth every 6 (six) hours as needed for mild pain.   allopurinol 100 MG tablet Commonly known as:  ZYLOPRIM Take 100 mg by mouth daily.   carvedilol 6.25 MG tablet Commonly known as:  COREG Take 1 tablet (6.25 mg total) by mouth 2 (two) times daily.   colchicine 0.6 MG tablet Take 0.6 mg by mouth 2 (two) times daily as needed (Gout).   ferrous sulfate 325 (65 FE) MG tablet Take 325 mg by mouth daily with breakfast.   multivitamin with minerals tablet Take 1 tablet by mouth daily.   pantoprazole 40 MG tablet Commonly known as:  PROTONIX Take 1 tablet (40 mg total) by mouth daily.   rivaroxaban 20 MG Tabs tablet Commonly known as:  XARELTO Take 1 tablet (20 mg total) by mouth daily with supper.       Disposition:  Discharge Instructions    Diet - low sodium heart healthy   Complete by:  As directed    Increase activity slowly   Complete by:  As directed      Follow-up Information    [facility] Follow up on 09/03/2017.   Specialty:  Cardiology Why:  at [facility] information: [address] [id number] Danice Goltz [place] [address] [phone]       [name], MD Follow up on 11/05/2017.   Specialty:  Cardiology Why:  at 9:30AM Contact information: [place] [address] [phone]           Duration of Discharge Encounter: Greater than 30 minutes including  physician time.  Signed, Chanetta Marshall, NP 08/03/2017 6:51 AM   [name] MD, [facility]

## 2017-08-03 NOTE — Progress Notes (Signed)
Discharge instructions (including medications) discussed with and copy provided to patient/caregiver 

## 2017-08-31 ENCOUNTER — Ambulatory Visit (INDEPENDENT_AMBULATORY_CARE_PROVIDER_SITE_OTHER): Payer: Medicare Other | Admitting: *Deleted

## 2017-08-31 DIAGNOSIS — I480 Paroxysmal atrial fibrillation: Secondary | ICD-10-CM

## 2017-09-03 ENCOUNTER — Encounter (HOSPITAL_COMMUNITY): Payer: Self-pay | Admitting: Nurse Practitioner

## 2017-09-03 ENCOUNTER — Ambulatory Visit (HOSPITAL_COMMUNITY)
Admission: RE | Admit: 2017-09-03 | Discharge: 2017-09-03 | Disposition: A | Discharge status: Home / Self Care | Payer: Medicare Other | Source: Ambulatory Visit | Attending: Nurse Practitioner | Admitting: Nurse Practitioner

## 2017-09-03 VITALS — BP 142/74 | HR 57 | Ht 70.0 in | Wt 195.0 lb

## 2017-09-03 DIAGNOSIS — I4819 Other persistent atrial fibrillation: Secondary | ICD-10-CM

## 2017-09-03 DIAGNOSIS — Z7901 Long term (current) use of anticoagulants: Secondary | ICD-10-CM | POA: Insufficient documentation

## 2017-09-03 DIAGNOSIS — I481 Persistent atrial fibrillation: Secondary | ICD-10-CM | POA: Diagnosis not present

## 2017-09-03 DIAGNOSIS — G4733 Obstructive sleep apnea (adult) (pediatric): Secondary | ICD-10-CM | POA: Insufficient documentation

## 2017-09-03 DIAGNOSIS — Z9889 Other specified postprocedural states: Secondary | ICD-10-CM | POA: Insufficient documentation

## 2017-09-03 DIAGNOSIS — Z888 Allergy status to other drugs, medicaments and biological substances status: Secondary | ICD-10-CM | POA: Diagnosis not present

## 2017-09-03 DIAGNOSIS — E785 Hyperlipidemia, unspecified: Secondary | ICD-10-CM | POA: Insufficient documentation

## 2017-09-03 DIAGNOSIS — Z8042 Family history of malignant neoplasm of prostate: Secondary | ICD-10-CM | POA: Diagnosis not present

## 2017-09-03 DIAGNOSIS — Z9049 Acquired absence of other specified parts of digestive tract: Secondary | ICD-10-CM | POA: Diagnosis not present

## 2017-09-03 DIAGNOSIS — Z79899 Other long term (current) drug therapy: Secondary | ICD-10-CM | POA: Diagnosis not present

## 2017-09-03 NOTE — Progress Notes (Signed)
Primary Care Physician: Bradley Hilding, MD Referring Physician: Dr. Myrtie Cruise Bradley Short is a 84 y.o. male with a h/o atrial flutter ablation and persistent  afib that underwent afib ablation 6/6. Loop recorder showed persistent afib until he went into SR June 30th. He is in SR today. He could never tell if he was in afib or in SR. He reports no swallowing issues or groin issues today.    Today, he denies symptoms of palpitations, chest pain, shortness of breath, orthopnea, PND, lower extremity edema, dizziness, presyncope, syncope, or neurologic sequela. The patient is tolerating medications without difficulties and is otherwise without complaint today.   Past Medical History:  Diagnosis Date  . Arthritis   . Atrial fibrillation (HCC)    paroxysmal, s/p PVI by Dr Bradley Short 8/12  . Atrial flutter ([place])    s/p CTI ablation  . Cancer [facility])    4th stage dysplasia mass in colon-removed 7/18  . Dysrhythmia    A. fib/a. a. flutter  . Hyperlipidemia   . Obstructive sleep apnea (adult) (pediatric)    does not use cpap   Past Surgical History:  Procedure Laterality Date  . ATRIAL ABLATION SURGERY     s/p CTI and PVI ablations by Dr Bradley Short 8/12  . ATRIAL FIBRILLATION ABLATION N/A 08/02/2017   Procedure: ATRIAL FIBRILLATION ABLATION;  Surgeon: Bradley Grayer, MD;  Location: [place] [facility];  Service: Cardiovascular;  Laterality: N/A;  . CARDIAC CATHETERIZATION     nonobstructive CAD 2000  . COLONOSCOPY WITH PROPOFOL N/A 08/24/2016   Procedure: COLONOSCOPY WITH PROPOFOL;  Surgeon: Bradley Irani, MD;  [facility];  Service: Endoscopy;  Laterality: N/A;  . INSERTION OF MESH N/A 02/28/2017   Procedure: INSERTION OF MESH;  Surgeon: Bradley Kussmaul, MD;  Location: [place];  Service: General;  Laterality: N/A;  . LAPAROSCOPIC RIGHT COLECTOMY Right 08/28/2016   Procedure: LAPAROSCOPIC ASSISTED RIGHT COLECTOMY;  Surgeon: Bradley Kussmaul, MD;  [facility];  Service: General;  Laterality:  Right;  . LOOP RECORDER INSERTION N/A 11/21/2016   Procedure: LOOP RECORDER INSERTION;  Surgeon: Bradley Grayer, MD;  Location: [place] [facility];  Service: Cardiovascular;  Laterality: N/A;  . MELANOMA EXCISION Right    shoulder  . VENTRAL HERNIA REPAIR  02/28/2017  . VENTRAL HERNIA REPAIR N/A 02/28/2017   Procedure: LAPAROSCOPIC VENTRAL HERNIA REPAIR WITH MESH;  Surgeon: Bradley Kussmaul, MD;  Location: [place];  Service: General;  Laterality: N/A;    Current Outpatient Medications  Medication Sig Dispense Refill  . acetaminophen (TYLENOL) 325 MG tablet Take 325 mg by mouth every 6 (six) hours as needed for mild pain.    Marland Kitchen allopurinol (ZYLOPRIM) 100 MG tablet Take 100 mg by mouth daily.     . carvedilol (COREG) 6.25 MG tablet Take 1 tablet (6.25 mg total) by mouth 2 (two) times daily. 180 tablet 3  . colchicine 0.6 MG tablet Take 0.6 mg by mouth 2 (two) times daily as needed (Gout).     . ferrous sulfate 325 (65 FE) MG tablet Take 325 mg by mouth daily with breakfast.    . Multiple Vitamins-Minerals (MULTIVITAMIN WITH MINERALS) tablet Take 1 tablet by mouth daily.    . pantoprazole (PROTONIX) 40 MG tablet Take 1 tablet (40 mg total) by mouth daily. 45 tablet 0  . rivaroxaban (XARELTO) 20 MG TABS tablet Take 1 tablet (20 mg total) by mouth daily with supper. 90 tablet 1   No current facility-administered medications  for this encounter.     Allergies  Allergen Reactions  . Amiodarone     Developed severe drug rash while on Amiodarone and Diltiazem unclear which one of them was culprit  . Diltiazem Hcl     REACTION: Looked like burned all over  . Lisinopril     REACTION: Looked like burned all over  . Metoprolol Succinate     REACTION: Looked like burned all over  . Propafenone Hcl     REACTION: Looked like burned all over  . Robaxin [Methocarbamol]     Rash all over    Social History   Socioeconomic History  . Marital status: Married    Spouse name: Not on file  . Number of  children: Not on file  . Years of education: Not on file  . Highest education level: Not on file  Occupational History  . Not on file  Social Needs  . Financial resource strain: Not on file  . Food insecurity:    Worry: Not on file    Inability: Not on file  . Transportation needs:    Medical: Not on file    Non-medical: Not on file  Tobacco Use  . Smoking status: Never Smoker  . Smokeless tobacco: Never Used  Substance and Sexual Activity  . Alcohol use: No  . Drug use: No  . Sexual activity: Not on file  Lifestyle  . Physical activity:    Days per week: Not on file    Minutes per session: Not on file  . Stress: Not on file  Relationships  . Social connections:    Talks on phone: Not on file    Gets together: Not on file    Attends religious service: Not on file    Active member of club or organization: Not on file    Attends meetings of clubs or organizations: Not on file    Relationship status: Not on file  . Intimate partner violence:    Fear of current or ex partner: Not on file    Emotionally abused: Not on file    Physically abused: Not on file    Forced sexual activity: Not on file  Other Topics Concern  . Not on file  Social History Narrative   Lives in [place].  Retired Warden/ranger    Family History  Problem Relation Age of Onset  . Prostate cancer Father   . Seizures Other   . Cancer Other   . Diabetes Other   . Heart disease Other     ROS- All systems are reviewed and negative except as per the HPI above  Physical Exam: Vitals:   09/03/17 1129  BP: (!) 142/74  Pulse: (!) 57  Weight: 195 lb (88.5 kg)  Height: 5\' 10"  (1.778 m)   Wt Readings from Last 3 Encounters:  09/03/17 195 lb (88.5 kg)  08/03/17 197 lb 1.6 oz (89.4 kg)  06/29/17 199 lb 9.6 oz (90.5 kg)    Labs: Lab Results  Component Value Date   NA 140 07/30/2017   K 4.1 07/30/2017   CL 105 07/30/2017   CO2 23 07/30/2017   GLUCOSE 114 (H) 07/30/2017   BUN 11 07/30/2017    CREATININE 1.20 07/30/2017   CALCIUM 8.8 07/30/2017   PHOS 2.8 09/04/2016   MG 2.3 09/04/2016   Lab Results  Component Value Date   INR 1.23 02/23/2017   Lab Results  Component Value Date   TRIG 104 09/04/2016  GEN- The patient is well appearing, alert and oriented x 3 today.   Head- normocephalic, atraumatic Eyes-  Sclera clear, conjunctiva pink Ears- hearing intact Oropharynx- clear Neck- supple, no JVP Lymph- no cervical lymphadenopathy Lungs- Clear to ausculation bilaterally, normal work of breathing Heart- Regular rate and rhythm, no murmurs, rubs or gallops, PMI not laterally displaced GI- soft, NT, ND, + BS Extremities- no clubbing, cyanosis, or edema MS- no significant deformity or atrophy Skin- no rash or lesion Psych- euthymic mood, full affect Neuro- strength and sensation are intact  EKG-sinus brady at 57 bpm, pr int 258 ms, qrs int 80 ms, qtc 391 ms Epic records reviewed    Assessment and Plan: 1. Afib S/p ablation 6/6 In SR today Continue xarelto at 20 mg tabs, reminded not to miss any anticoagulation in the 3 months following procedure  Continue coreg 6.25 mg bid  F/u with Dr. Rayann Short 9/9  afib clinic as needed   Butch Penny C. Merilynn Haydu, [facility] [address] [place], [place] [address] [phone]

## 2017-09-03 NOTE — Progress Notes (Signed)
Carelink Summary Report / Loop Recorder 

## 2017-09-06 LAB — CUP PACEART REMOTE DEVICE CHECK
Date Time Interrogation Session: 20190604183604
Implantable Pulse Generator Implant Date: 20180925

## 2017-10-05 ENCOUNTER — Ambulatory Visit (INDEPENDENT_AMBULATORY_CARE_PROVIDER_SITE_OTHER): Payer: Medicare Other | Admitting: *Deleted

## 2017-10-05 DIAGNOSIS — I480 Paroxysmal atrial fibrillation: Secondary | ICD-10-CM

## 2017-10-05 LAB — CUP PACEART REMOTE DEVICE CHECK
Date Time Interrogation Session: 20190707193842
Implantable Pulse Generator Implant Date: 20180925

## 2017-10-05 NOTE — Progress Notes (Signed)
Carelink Summary Report / Loop Recorder 

## 2017-11-05 ENCOUNTER — Ambulatory Visit (INDEPENDENT_AMBULATORY_CARE_PROVIDER_SITE_OTHER): Payer: Medicare Other | Admitting: Internal Medicine

## 2017-11-05 ENCOUNTER — Encounter: Payer: Self-pay | Admitting: Internal Medicine

## 2017-11-05 VITALS — BP 132/80 | HR 58 | Ht 70.0 in | Wt 197.0 lb

## 2017-11-05 DIAGNOSIS — I4892 Unspecified atrial flutter: Secondary | ICD-10-CM

## 2017-11-05 DIAGNOSIS — I10 Essential (primary) hypertension: Secondary | ICD-10-CM

## 2017-11-05 DIAGNOSIS — I4819 Other persistent atrial fibrillation: Secondary | ICD-10-CM

## 2017-11-05 DIAGNOSIS — I481 Persistent atrial fibrillation: Secondary | ICD-10-CM | POA: Diagnosis not present

## 2017-11-05 MED ORDER — CARVEDILOL 6.25 MG PO TABS: 6.2500 mg | ORAL_TABLET | Freq: Two times a day (BID) | ORAL | 3 refills | Status: DC

## 2017-11-05 NOTE — Progress Notes (Signed)
PCP: Manon Hilding, MD  Bradley Short is a 84 y.o. male who presents today for routine electrophysiology followup.  Since his recent afib ablation, the patient reports doing very well.  he denies procedure related complications and is pleased with the results of the procedure.  Today, he denies symptoms of palpitations, chest pain, shortness of breath,  lower extremity edema, dizziness, presyncope, or syncope.  The patient is otherwise without complaint today.   Past Medical History:  Diagnosis Date  . Arthritis   . Atrial fibrillation (HCC)    paroxysmal, s/p PVI by Dr Rayann Heman 8/12  . Atrial flutter ([place])    s/p CTI ablation  . Cancer [facility])    4th stage dysplasia mass in colon-removed 7/18  . Dysrhythmia    A. fib/a. a. flutter  . Hyperlipidemia   . Obstructive sleep apnea (adult) (pediatric)    does not use cpap   Past Surgical History:  Procedure Laterality Date  . ATRIAL ABLATION SURGERY     s/p CTI and PVI ablations by Dr Rayann Heman 8/12  . ATRIAL FIBRILLATION ABLATION N/A 08/02/2017   Procedure: ATRIAL FIBRILLATION ABLATION;  Surgeon: [name], MD;  Location: [place] [facility];  Service: Cardiovascular;  Laterality: N/A;  . CARDIAC CATHETERIZATION     nonobstructive CAD 2000  . COLONOSCOPY WITH PROPOFOL N/A 08/24/2016   Procedure: COLONOSCOPY WITH PROPOFOL;  Surgeon: Teena Irani, MD;  [facility];  Service: Endoscopy;  Laterality: N/A;  . INSERTION OF MESH N/A 02/28/2017   Procedure: INSERTION OF MESH;  Surgeon: Jovita Kussmaul, MD;  Location: [place];  Service: General;  Laterality: N/A;  . LAPAROSCOPIC RIGHT COLECTOMY Right 08/28/2016   Procedure: LAPAROSCOPIC ASSISTED RIGHT COLECTOMY;  Surgeon: Jovita Kussmaul, MD;  [facility];  Service: General;  Laterality: Right;  . LOOP RECORDER INSERTION N/A 11/21/2016   Procedure: LOOP RECORDER INSERTION;  Surgeon: [name], MD;  Location: [place] [facility];  Service: Cardiovascular;  Laterality: N/A;  . MELANOMA  EXCISION Right    shoulder  . VENTRAL HERNIA REPAIR  02/28/2017  . VENTRAL HERNIA REPAIR N/A 02/28/2017   Procedure: LAPAROSCOPIC VENTRAL HERNIA REPAIR WITH MESH;  Surgeon: Jovita Kussmaul, MD;  Location: [place];  Service: General;  Laterality: N/A;    ROS- all systems are personally reviewed and negatives except as per HPI above  Current Outpatient Medications  Medication Sig Dispense Refill  . acetaminophen (TYLENOL) 325 MG tablet Take 325 mg by mouth every 6 (six) hours as needed for mild pain.    Marland Kitchen allopurinol (ZYLOPRIM) 100 MG tablet Take 100 mg by mouth daily.     . carvedilol (COREG) 6.25 MG tablet Take 1 tablet (6.25 mg total) by mouth 2 (two) times daily. 180 tablet 3  . colchicine 0.6 MG tablet Take 0.6 mg by mouth 2 (two) times daily as needed (Gout).     . ferrous sulfate 325 (65 FE) MG tablet Take 325 mg by mouth daily with breakfast.    . Multiple Vitamins-Minerals (MULTIVITAMIN WITH MINERALS) tablet Take 1 tablet by mouth daily.    . rivaroxaban (XARELTO) 20 MG TABS tablet Take 1 tablet (20 mg total) by mouth daily with supper. 90 tablet 1   No current facility-administered medications for this visit.     Physical Exam: Vitals:   11/05/17 0955  BP: 132/80  Pulse: (!) 58  SpO2: 98%  Weight: 197 lb (89.4 kg)  Height: 5\' 10"  (1.778 m)    GEN-  The patient is well appearing, alert and oriented x 3 today.   Head- normocephalic, atraumatic Eyes-  Sclera clear, conjunctiva pink Ears- hearing intact Oropharynx- clear Lungs- Clear to ausculation bilaterally, normal work of breathing Heart- Regular rate and rhythm, no murmurs, rubs or gallops, PMI not laterally displaced GI- soft, NT, ND, + BS Extremities- no clubbing, cyanosis, or edema  EKG tracing ordered today is personally reviewed and shows sinus rhythm 58 bpm, PR 232 msec,   Assessment and Plan:  1. Persistent atrial fibrillation/ atrial flutter Doing well s/p ablation chads2vasc score is 3.  He is on  xarelto AF burden by ILR is 39% but was mostliy ERAF early post ablation.  Very pleased that he has since been maintaining sinus rhythm  2. HTN Stable No change required today  [facility]link  Return to see me in 3 months  [name] MD, [facility] 11/05/2017 10:05 AM

## 2017-11-05 NOTE — Patient Instructions (Addendum)
Medication Instructions:  Your physician recommends that you continue on your current medications as directed. Please refer to the Current Medication list given to you today.  Labwork: None ordered.  Testing/Procedures: None ordered.  Follow-Up: Your physician wants you to follow-up in: 3 months with Dr. Allred.      Any Other Special Instructions Will Be Listed Below (If Applicable).  If you need a refill on your cardiac medications before your next appointment, please call your pharmacy.   

## 2017-11-07 ENCOUNTER — Ambulatory Visit (INDEPENDENT_AMBULATORY_CARE_PROVIDER_SITE_OTHER): Payer: Medicare Other | Admitting: *Deleted

## 2017-11-07 DIAGNOSIS — I480 Paroxysmal atrial fibrillation: Secondary | ICD-10-CM | POA: Diagnosis not present

## 2017-11-08 NOTE — Progress Notes (Signed)
Carelink Summary Report / Loop Recorder 

## 2017-11-14 LAB — CUP PACEART REMOTE DEVICE CHECK
Date Time Interrogation Session: 20190809201130
MDC IDC PG IMPLANT DT: 20180925

## 2017-11-22 DIAGNOSIS — H5203 Hypermetropia, bilateral: Secondary | ICD-10-CM | POA: Diagnosis not present

## 2017-11-22 DIAGNOSIS — H35373 Puckering of macula, bilateral: Secondary | ICD-10-CM | POA: Diagnosis not present

## 2017-11-22 DIAGNOSIS — Z961 Presence of intraocular lens: Secondary | ICD-10-CM | POA: Diagnosis not present

## 2017-11-23 LAB — CUP PACEART REMOTE DEVICE CHECK
Date Time Interrogation Session: 20190911203859
MDC IDC PG IMPLANT DT: 20180925

## 2017-12-10 ENCOUNTER — Ambulatory Visit (INDEPENDENT_AMBULATORY_CARE_PROVIDER_SITE_OTHER): Payer: Medicare Other | Admitting: *Deleted

## 2017-12-10 DIAGNOSIS — I480 Paroxysmal atrial fibrillation: Secondary | ICD-10-CM

## 2017-12-10 DIAGNOSIS — Z23 Encounter for immunization: Secondary | ICD-10-CM | POA: Diagnosis not present

## 2017-12-11 NOTE — Progress Notes (Signed)
Carelink Summary Report / Loop Recorder 

## 2017-12-13 DIAGNOSIS — F411 Generalized anxiety disorder: Secondary | ICD-10-CM | POA: Diagnosis not present

## 2017-12-13 DIAGNOSIS — K210 Gastro-esophageal reflux disease with esophagitis: Secondary | ICD-10-CM | POA: Diagnosis not present

## 2017-12-13 DIAGNOSIS — E7800 Pure hypercholesterolemia, unspecified: Secondary | ICD-10-CM | POA: Diagnosis not present

## 2017-12-13 DIAGNOSIS — N183 Chronic kidney disease, stage 3 (moderate): Secondary | ICD-10-CM | POA: Diagnosis not present

## 2017-12-13 DIAGNOSIS — M109 Gout, unspecified: Secondary | ICD-10-CM | POA: Diagnosis not present

## 2017-12-13 DIAGNOSIS — R7301 Impaired fasting glucose: Secondary | ICD-10-CM | POA: Diagnosis not present

## 2017-12-18 DIAGNOSIS — K210 Gastro-esophageal reflux disease with esophagitis: Secondary | ICD-10-CM | POA: Diagnosis not present

## 2017-12-18 DIAGNOSIS — N529 Male erectile dysfunction, unspecified: Secondary | ICD-10-CM | POA: Diagnosis not present

## 2017-12-18 DIAGNOSIS — I482 Chronic atrial fibrillation, unspecified: Secondary | ICD-10-CM | POA: Diagnosis not present

## 2017-12-18 DIAGNOSIS — Z6827 Body mass index (BMI) 27.0-27.9, adult: Secondary | ICD-10-CM | POA: Diagnosis not present

## 2017-12-18 DIAGNOSIS — N183 Chronic kidney disease, stage 3 (moderate): Secondary | ICD-10-CM | POA: Diagnosis not present

## 2017-12-18 DIAGNOSIS — R7301 Impaired fasting glucose: Secondary | ICD-10-CM | POA: Diagnosis not present

## 2017-12-18 DIAGNOSIS — M109 Gout, unspecified: Secondary | ICD-10-CM | POA: Diagnosis not present

## 2017-12-18 DIAGNOSIS — D696 Thrombocytopenia, unspecified: Secondary | ICD-10-CM | POA: Diagnosis not present

## 2017-12-24 LAB — CUP PACEART REMOTE DEVICE CHECK
Date Time Interrogation Session: 20191014203802
Implantable Pulse Generator Implant Date: 20180925

## 2018-01-14 ENCOUNTER — Ambulatory Visit (INDEPENDENT_AMBULATORY_CARE_PROVIDER_SITE_OTHER): Payer: Medicare Other

## 2018-01-14 DIAGNOSIS — I480 Paroxysmal atrial fibrillation: Secondary | ICD-10-CM

## 2018-01-14 NOTE — Progress Notes (Signed)
Carelink Summary Report / Loop Recorder 

## 2018-02-07 ENCOUNTER — Ambulatory Visit: Payer: Medicare Other | Admitting: Internal Medicine

## 2018-02-13 ENCOUNTER — Ambulatory Visit (INDEPENDENT_AMBULATORY_CARE_PROVIDER_SITE_OTHER): Payer: Medicare Other | Admitting: Internal Medicine

## 2018-02-13 ENCOUNTER — Encounter: Payer: Self-pay | Admitting: Internal Medicine

## 2018-02-13 VITALS — BP 130/72 | HR 58 | Ht 70.0 in | Wt 201.0 lb

## 2018-02-13 DIAGNOSIS — I4819 Other persistent atrial fibrillation: Secondary | ICD-10-CM

## 2018-02-13 DIAGNOSIS — I10 Essential (primary) hypertension: Secondary | ICD-10-CM | POA: Diagnosis not present

## 2018-02-13 NOTE — Addendum Note (Signed)
Addended by: Rose Phi on: 02/13/2018 05:32 PM   Modules accepted: Orders

## 2018-02-13 NOTE — Patient Instructions (Addendum)
Medication Instructions:  Your physician recommends that you continue on your current medications as directed. Please refer to the Current Medication list given to you today.  *If you need a refill on your cardiac medications before your next appointment, please call your pharmacy*  Labwork: None ordered  Testing/Procedures: None ordered  Follow-Up: Remote monitoring is used to monitor your loop recorder monthly. You are scheduled for a device check from home on 02/14/2018  Your physician wants you to follow-up in: 6 months with [place], [place] in the AFib clinic.  You will receive a reminder letter in the mail two months in advance. If you don't receive a letter, please call our office to schedule the follow-up appointment.  Thank you for choosing [facility]!!

## 2018-02-13 NOTE — Progress Notes (Signed)
PCP: Manon Hilding, MD   Primary EP: Dr Teressa Senter is a 84 y.o. male who presents today for routine electrophysiology followup.  Since last being seen in our clinic, the patient reports doing very well.  Today, he denies symptoms of palpitations, chest pain, shortness of breath,  lower extremity edema, dizziness, presyncope, or syncope.  The patient is otherwise without complaint today.   Past Medical History:  Diagnosis Date  . Arthritis   . Atrial fibrillation (HCC)    paroxysmal, s/p PVI by Dr Rayann Heman 8/12  . Atrial flutter ([place])    s/p CTI ablation  . Cancer [facility])    4th stage dysplasia mass in colon-removed 7/18  . Dysrhythmia    A. fib/a. a. flutter  . Hyperlipidemia   . Obstructive sleep apnea (adult) (pediatric)    does not use cpap   Past Surgical History:  Procedure Laterality Date  . ATRIAL ABLATION SURGERY     s/p CTI and PVI ablations by Dr Rayann Heman 8/12  . ATRIAL FIBRILLATION ABLATION N/A 08/02/2017   Procedure: ATRIAL FIBRILLATION ABLATION;  Surgeon: [name], MD;  Location: [place] [facility];  Service: Cardiovascular;  Laterality: N/A;  . CARDIAC CATHETERIZATION     nonobstructive CAD 2000  . COLONOSCOPY WITH PROPOFOL N/A 08/24/2016   Procedure: COLONOSCOPY WITH PROPOFOL;  Surgeon: Teena Irani, MD;  [facility];  Service: Endoscopy;  Laterality: N/A;  . INSERTION OF MESH N/A 02/28/2017   Procedure: INSERTION OF MESH;  Surgeon: Jovita Kussmaul, MD;  Location: [place];  Service: General;  Laterality: N/A;  . LAPAROSCOPIC RIGHT COLECTOMY Right 08/28/2016   Procedure: LAPAROSCOPIC ASSISTED RIGHT COLECTOMY;  Surgeon: Jovita Kussmaul, MD;  [facility];  Service: General;  Laterality: Right;  . LOOP RECORDER INSERTION N/A 11/21/2016   Procedure: LOOP RECORDER INSERTION;  Surgeon: [name], MD;  Location: [place] [facility];  Service: Cardiovascular;  Laterality: N/A;  . MELANOMA EXCISION Right    shoulder  . VENTRAL HERNIA REPAIR   02/28/2017  . VENTRAL HERNIA REPAIR N/A 02/28/2017   Procedure: LAPAROSCOPIC VENTRAL HERNIA REPAIR WITH MESH;  Surgeon: Jovita Kussmaul, MD;  Location: [place];  Service: General;  Laterality: N/A;    ROS- all systems are reviewed and negatives except as per HPI above  Current Outpatient Medications  Medication Sig Dispense Refill  . acetaminophen (TYLENOL) 325 MG tablet Take 325 mg by mouth every 6 (six) hours as needed for mild pain.    Marland Kitchen allopurinol (ZYLOPRIM) 100 MG tablet Take 100 mg by mouth daily.     . carvedilol (COREG) 6.25 MG tablet Take 1 tablet (6.25 mg total) by mouth 2 (two) times daily. 180 tablet 3  . colchicine 0.6 MG tablet Take 0.6 mg by mouth 2 (two) times daily as needed (Gout).     . Multiple Vitamins-Minerals (MULTIVITAMIN WITH MINERALS) tablet Take 1 tablet by mouth daily.    . rivaroxaban (XARELTO) 20 MG TABS tablet Take 1 tablet (20 mg total) by mouth daily with supper. 90 tablet 1   No current facility-administered medications for this visit.     Physical Exam: Vitals:   02/13/18 1001  BP: 130/72  Pulse: (!) 58  SpO2: 97%  Weight: 201 lb (91.2 kg)  Height: 5\' 10"  (1.778 m)    GEN- The patient is well appearing, alert and oriented x 3 today.   Head- normocephalic, atraumatic Eyes-  Sclera clear, conjunctiva pink Ears- hearing intact Oropharynx- clear  Lungs- Clear to ausculation bilaterally, normal work of breathing Heart- Regular rate and rhythm, no murmurs, rubs or gallops, PMI not laterally displaced GI- soft, NT, ND, + BS Extremities- no clubbing, cyanosis, or edema  Wt Readings from Last 3 Encounters:  02/13/18 201 lb (91.2 kg)  11/05/17 197 lb (89.4 kg)  09/03/17 195 lb (88.5 kg)    EKG tracing ordered today is personally reviewed and shows sinus rhythm, PR 250 msec, otherwise normal ekg  Assessment and Plan:  1. Persistent afib/ atrial flutter Doing very well post ablation chads2vasc score is 3.  He is on xarelto AF burden by ILR is 0.1  % (39% last visit)  2. HTN Stable No change required today  Carelink Return to see Audry Pili in the AF clinic in 6 months  [name] MD, [facility] 02/13/2018 10:16 AM

## 2018-02-14 ENCOUNTER — Ambulatory Visit (INDEPENDENT_AMBULATORY_CARE_PROVIDER_SITE_OTHER): Payer: Medicare Other

## 2018-02-14 DIAGNOSIS — I4819 Other persistent atrial fibrillation: Secondary | ICD-10-CM

## 2018-02-14 DIAGNOSIS — R002 Palpitations: Secondary | ICD-10-CM

## 2018-02-15 NOTE — Progress Notes (Signed)
Carelink Summary Report / Loop Recorder 

## 2018-02-28 ENCOUNTER — Other Ambulatory Visit: Payer: Self-pay | Admitting: Internal Medicine

## 2018-03-01 NOTE — Telephone Encounter (Signed)
Pt last saw Dr Rayann Heman 02/13/18, last labs 07/30/17 Creat 1.38, age 85, weight 91.2kg, CrCl 52.32, based on CrCl pt is on appropriate dosage of Xarelto 20mg  QD.  Will refill rx.

## 2018-03-03 LAB — CUP PACEART REMOTE DEVICE CHECK
Date Time Interrogation Session: 20191116214110
Implantable Pulse Generator Implant Date: 20180925

## 2018-03-17 LAB — CUP PACEART REMOTE DEVICE CHECK
Date Time Interrogation Session: 20191219231021
Implantable Pulse Generator Implant Date: 20180925

## 2018-03-19 ENCOUNTER — Ambulatory Visit (INDEPENDENT_AMBULATORY_CARE_PROVIDER_SITE_OTHER): Payer: Medicare Other

## 2018-03-19 DIAGNOSIS — R002 Palpitations: Secondary | ICD-10-CM | POA: Diagnosis not present

## 2018-03-19 DIAGNOSIS — I480 Paroxysmal atrial fibrillation: Secondary | ICD-10-CM

## 2018-03-20 LAB — CUP PACEART REMOTE DEVICE CHECK
Date Time Interrogation Session: 20200121233843
MDC IDC PG IMPLANT DT: 20180925

## 2018-03-20 NOTE — Progress Notes (Signed)
Carelink Summary Report / Loop Recorder 

## 2018-04-17 LAB — CUP PACEART INCLINIC DEVICE CHECK
Date Time Interrogation Session: 20191218161834
Implantable Pulse Generator Implant Date: 20180925

## 2018-04-22 ENCOUNTER — Ambulatory Visit (INDEPENDENT_AMBULATORY_CARE_PROVIDER_SITE_OTHER): Payer: Medicare Other | Admitting: *Deleted

## 2018-04-22 DIAGNOSIS — R002 Palpitations: Secondary | ICD-10-CM | POA: Diagnosis not present

## 2018-04-22 DIAGNOSIS — I480 Paroxysmal atrial fibrillation: Secondary | ICD-10-CM

## 2018-04-23 LAB — CUP PACEART REMOTE DEVICE CHECK
Date Time Interrogation Session: 20200223233808
Implantable Pulse Generator Implant Date: 20180925

## 2018-04-30 NOTE — Progress Notes (Signed)
Carelink Summary Report / Loop Recorder 

## 2018-05-24 ENCOUNTER — Ambulatory Visit (INDEPENDENT_AMBULATORY_CARE_PROVIDER_SITE_OTHER): Payer: Medicare Other | Admitting: *Deleted

## 2018-05-24 ENCOUNTER — Other Ambulatory Visit: Payer: Self-pay

## 2018-05-24 DIAGNOSIS — I480 Paroxysmal atrial fibrillation: Secondary | ICD-10-CM | POA: Diagnosis not present

## 2018-05-25 LAB — CUP PACEART REMOTE DEVICE CHECK
Date Time Interrogation Session: 20200328000935
Implantable Pulse Generator Implant Date: 20180925

## 2018-05-27 NOTE — Progress Notes (Signed)
Carelink Summary Report / Loop Recorder 

## 2018-06-05 DIAGNOSIS — M109 Gout, unspecified: Secondary | ICD-10-CM | POA: Diagnosis not present

## 2018-06-05 DIAGNOSIS — D696 Thrombocytopenia, unspecified: Secondary | ICD-10-CM | POA: Diagnosis not present

## 2018-06-05 DIAGNOSIS — R7301 Impaired fasting glucose: Secondary | ICD-10-CM | POA: Diagnosis not present

## 2018-06-05 DIAGNOSIS — N183 Chronic kidney disease, stage 3 (moderate): Secondary | ICD-10-CM | POA: Diagnosis not present

## 2018-06-05 DIAGNOSIS — E7800 Pure hypercholesterolemia, unspecified: Secondary | ICD-10-CM | POA: Diagnosis not present

## 2018-06-05 DIAGNOSIS — K210 Gastro-esophageal reflux disease with esophagitis: Secondary | ICD-10-CM | POA: Diagnosis not present

## 2018-06-12 DIAGNOSIS — M109 Gout, unspecified: Secondary | ICD-10-CM | POA: Diagnosis not present

## 2018-06-12 DIAGNOSIS — N183 Chronic kidney disease, stage 3 (moderate): Secondary | ICD-10-CM | POA: Diagnosis not present

## 2018-06-12 DIAGNOSIS — I4891 Unspecified atrial fibrillation: Secondary | ICD-10-CM | POA: Diagnosis not present

## 2018-06-12 DIAGNOSIS — Z6827 Body mass index (BMI) 27.0-27.9, adult: Secondary | ICD-10-CM | POA: Diagnosis not present

## 2018-06-12 DIAGNOSIS — D696 Thrombocytopenia, unspecified: Secondary | ICD-10-CM | POA: Diagnosis not present

## 2018-06-12 DIAGNOSIS — R7301 Impaired fasting glucose: Secondary | ICD-10-CM | POA: Diagnosis not present

## 2018-06-12 DIAGNOSIS — E7800 Pure hypercholesterolemia, unspecified: Secondary | ICD-10-CM | POA: Diagnosis not present

## 2018-06-12 DIAGNOSIS — K210 Gastro-esophageal reflux disease with esophagitis: Secondary | ICD-10-CM | POA: Diagnosis not present

## 2018-06-26 ENCOUNTER — Ambulatory Visit (INDEPENDENT_AMBULATORY_CARE_PROVIDER_SITE_OTHER): Payer: Medicare Other | Admitting: *Deleted

## 2018-06-26 ENCOUNTER — Other Ambulatory Visit: Payer: Self-pay

## 2018-06-26 DIAGNOSIS — R002 Palpitations: Secondary | ICD-10-CM

## 2018-06-26 DIAGNOSIS — I480 Paroxysmal atrial fibrillation: Secondary | ICD-10-CM

## 2018-06-27 LAB — CUP PACEART REMOTE DEVICE CHECK
Date Time Interrogation Session: 20200430003709
Implantable Pulse Generator Implant Date: 20180925

## 2018-07-05 NOTE — Progress Notes (Signed)
Carelink Summary Report / Loop Recorder 

## 2018-07-29 ENCOUNTER — Ambulatory Visit (INDEPENDENT_AMBULATORY_CARE_PROVIDER_SITE_OTHER): Payer: Medicare Other | Admitting: *Deleted

## 2018-07-29 DIAGNOSIS — I480 Paroxysmal atrial fibrillation: Secondary | ICD-10-CM | POA: Diagnosis not present

## 2018-07-30 LAB — CUP PACEART REMOTE DEVICE CHECK
Date Time Interrogation Session: 20200602004111
Implantable Pulse Generator Implant Date: 20180925

## 2018-08-05 NOTE — Progress Notes (Signed)
Carelink Summary Report / Loop Recorder 

## 2018-08-28 ENCOUNTER — Other Ambulatory Visit: Payer: Self-pay

## 2018-08-28 ENCOUNTER — Encounter (HOSPITAL_COMMUNITY): Payer: Self-pay | Admitting: Physician Assistant

## 2018-08-28 ENCOUNTER — Ambulatory Visit (HOSPITAL_COMMUNITY)
Admission: RE | Admit: 2018-08-28 | Discharge: 2018-08-28 | Disposition: A | Discharge status: Home / Self Care | Payer: Medicare Other | Source: Ambulatory Visit | Attending: Physician Assistant | Admitting: Physician Assistant

## 2018-08-28 ENCOUNTER — Ambulatory Visit (HOSPITAL_COMMUNITY): Payer: Medicare Other | Admitting: Physician Assistant

## 2018-08-28 VITALS — BP 144/82 | HR 104 | Ht 70.0 in | Wt 199.0 lb

## 2018-08-28 DIAGNOSIS — Z8042 Family history of malignant neoplasm of prostate: Secondary | ICD-10-CM | POA: Insufficient documentation

## 2018-08-28 DIAGNOSIS — I4819 Other persistent atrial fibrillation: Secondary | ICD-10-CM | POA: Insufficient documentation

## 2018-08-28 DIAGNOSIS — I4892 Unspecified atrial flutter: Secondary | ICD-10-CM | POA: Insufficient documentation

## 2018-08-28 DIAGNOSIS — G4733 Obstructive sleep apnea (adult) (pediatric): Secondary | ICD-10-CM | POA: Diagnosis not present

## 2018-08-28 DIAGNOSIS — E785 Hyperlipidemia, unspecified: Secondary | ICD-10-CM | POA: Diagnosis not present

## 2018-08-28 DIAGNOSIS — I10 Essential (primary) hypertension: Secondary | ICD-10-CM | POA: Insufficient documentation

## 2018-08-28 DIAGNOSIS — Z79899 Other long term (current) drug therapy: Secondary | ICD-10-CM | POA: Insufficient documentation

## 2018-08-28 DIAGNOSIS — I480 Paroxysmal atrial fibrillation: Secondary | ICD-10-CM

## 2018-08-28 DIAGNOSIS — Z7901 Long term (current) use of anticoagulants: Secondary | ICD-10-CM | POA: Insufficient documentation

## 2018-08-28 DIAGNOSIS — F419 Anxiety disorder, unspecified: Secondary | ICD-10-CM | POA: Insufficient documentation

## 2018-08-28 DIAGNOSIS — M1990 Unspecified osteoarthritis, unspecified site: Secondary | ICD-10-CM | POA: Diagnosis not present

## 2018-08-28 DIAGNOSIS — Z888 Allergy status to other drugs, medicaments and biological substances status: Secondary | ICD-10-CM | POA: Diagnosis not present

## 2018-08-28 NOTE — Progress Notes (Signed)
Primary Care Physician: Manon Hilding, MD Primary Cardiologist: none Primary Electrophysiologist: Dr Rayann Heman Referring Physician: Dr Teressa Senter is a 85 y.o. male with a history of persistent atrial fibrillation who presents for follow up in the [facility]. Patient reports that he has felt well with no symptoms of heart racing or palpitations. He does admit that he has been VERY anxious lately about COVID-19 and other life stressors. He appears to be in coarse afib vs atypical flutter today.  Today, he denies symptoms of palpitations, chest pain, shortness of breath, orthopnea, PND, lower extremity edema, dizziness, presyncope, syncope, snoring, daytime somnolence, bleeding, or neurologic sequela. The patient is tolerating medications without difficulties and is otherwise without complaint today.    Atrial Fibrillation Risk Factors:  he does have symptoms or diagnosis of sleep apnea. he is not compliant with CPAP therapy.   he has a BMI of Body mass index is 28.55 kg/m.Marland Kitchen Filed Weights   08/28/18 1345  Weight: 90.3 kg    Family History  Problem Relation Age of Onset  . Prostate cancer Father   . Seizures Other   . Cancer Other   . Diabetes Other   . Heart disease Other      Atrial Fibrillation Management history:  Previous antiarrhythmic drugs: amiodarone, propafenone Previous cardioversions: none Previous ablations: 2012, 08/02/17 CHADS2VASC score: 3 Anticoagulation history: Xarelto   Past Medical History:  Diagnosis Date  . Arthritis   . Atrial fibrillation (HCC)    paroxysmal, s/p PVI by Dr Rayann Heman 8/12  . Atrial flutter ([place])    s/p CTI ablation  . Cancer [facility])    4th stage dysplasia mass in colon-removed 7/18  . Dysrhythmia    A. fib/a. a. flutter  . Hyperlipidemia   . Obstructive sleep apnea (adult) (pediatric)    does not use cpap   Past Surgical History:  Procedure Laterality Date  . ATRIAL ABLATION SURGERY      s/p CTI and PVI ablations by Dr Rayann Heman 8/12  . ATRIAL FIBRILLATION ABLATION N/A 08/02/2017   Procedure: ATRIAL FIBRILLATION ABLATION;  Surgeon: [name], MD;  Location: [place] [facility];  Service: Cardiovascular;  Laterality: N/A;  . CARDIAC CATHETERIZATION     nonobstructive CAD 2000  . COLONOSCOPY WITH PROPOFOL N/A 08/24/2016   Procedure: COLONOSCOPY WITH PROPOFOL;  Surgeon: Teena Irani, MD;  [facility];  Service: Endoscopy;  Laterality: N/A;  . INSERTION OF MESH N/A 02/28/2017   Procedure: INSERTION OF MESH;  Surgeon: Jovita Kussmaul, MD;  Location: [place];  Service: General;  Laterality: N/A;  . LAPAROSCOPIC RIGHT COLECTOMY Right 08/28/2016   Procedure: LAPAROSCOPIC ASSISTED RIGHT COLECTOMY;  Surgeon: Jovita Kussmaul, MD;  [facility];  Service: General;  Laterality: Right;  . LOOP RECORDER INSERTION N/A 11/21/2016   Procedure: LOOP RECORDER INSERTION;  Surgeon: [name], MD;  Location: [place] [facility];  Service: Cardiovascular;  Laterality: N/A;  . MELANOMA EXCISION Right    shoulder  . VENTRAL HERNIA REPAIR  02/28/2017  . VENTRAL HERNIA REPAIR N/A 02/28/2017   Procedure: LAPAROSCOPIC VENTRAL HERNIA REPAIR WITH MESH;  Surgeon: Jovita Kussmaul, MD;  Location: [place];  Service: General;  Laterality: N/A;    Current Outpatient Medications  Medication Sig Dispense Refill  . acetaminophen (TYLENOL) 325 MG tablet Take 325 mg by mouth every 6 (six) hours as needed for mild pain.    Marland Kitchen allopurinol (ZYLOPRIM) 100 MG tablet Take 100  mg by mouth daily.     . carvedilol (COREG) 6.25 MG tablet Take 1 tablet (6.25 mg total) by mouth 2 (two) times daily. 180 tablet 3  . Multiple Vitamins-Minerals (MULTIVITAMIN WITH MINERALS) tablet Take 1 tablet by mouth daily.    Alveda Reasons 20 MG TABS tablet TAKE 1 TABLET BY MOUTH ONCE DAILY WITH  SUPPER 30 tablet 6  . colchicine 0.6 MG tablet Take 0.6 mg by mouth 2 (two) times daily as needed (Gout).      No current facility-administered  medications for this encounter.     Allergies  Allergen Reactions  . Amiodarone     Developed severe drug rash while on Amiodarone and Diltiazem unclear which one of them was culprit  . Diltiazem Hcl     REACTION: Looked like burned all over  . Lisinopril     REACTION: Looked like burned all over  . Metoprolol Succinate     REACTION: Looked like burned all over  . Propafenone Hcl     REACTION: Looked like burned all over  . Robaxin [Methocarbamol]     Rash all over    Social History   Socioeconomic History  . Marital status: Married    Spouse name: Not on file  . Number of children: Not on file  . Years of education: Not on file  . Highest education level: Not on file  Occupational History  . Not on file  Social Needs  . Financial resource strain: Not on file  . Food insecurity    Worry: Not on file    Inability: Not on file  . Transportation needs    Medical: Not on file    Non-medical: Not on file  Tobacco Use  . Smoking status: Never Smoker  . Smokeless tobacco: Never Used  Substance and Sexual Activity  . Alcohol use: No  . Drug use: No  . Sexual activity: Not on file  Lifestyle  . Physical activity    Days per week: Not on file    Minutes per session: Not on file  . Stress: Not on file  Relationships  . Social Herbalist on phone: Not on file    Gets together: Not on file    Attends religious service: Not on file    Active member of club or organization: Not on file    Attends meetings of clubs or organizations: Not on file    Relationship status: Not on file  . Intimate partner violence    Fear of current or ex partner: Not on file    Emotionally abused: Not on file    Physically abused: Not on file    Forced sexual activity: Not on file  Other Topics Concern  . Not on file  Social History Narrative   Lives in [place].  Retired Warden/ranger     ROS- All systems are reviewed and negative except as per the HPI above.  Physical Exam:  Vitals:   08/28/18 1345  BP: (!) 144/82  Pulse: (!) 104  Weight: 90.3 kg  Height: 5\' 10"  (1.778 m)    GEN- The patient is anxious appearing elderly male, alert and oriented x 3 today.   Head- normocephalic, atraumatic Eyes-  Sclera clear, conjunctiva pink Ears- hearing intact Oropharynx- clear Neck- supple  Lungs- Clear to ausculation bilaterally, normal work of breathing Heart- irregular rate and rhythm, no murmurs, rubs or gallops  GI- soft, NT, ND, + BS Extremities- no clubbing, cyanosis, or  edema MS- no significant deformity or atrophy Skin- no rash or lesion Psych- euthymic mood, full affect Neuro- strength and sensation are intact  Wt Readings from Last 3 Encounters:  08/28/18 90.3 kg  02/13/18 91.2 kg  11/05/17 89.4 kg    EKG today demonstrates coarse afib vs atypical atrial flutter HR 104, LAD, QRS 76, QTc 418  Echo 08/23/16 demonstrated  - Procedure narrative: Transthoracic echocardiography. Image   quality was poor. The study was technically difficult, as a   result of poor sound wave transmission. - Left ventricle: The cavity size was normal. Wall thickness was   increased in a pattern of mild LVH. Systolic function was normal.   The estimated ejection fraction was in the range of 60% to 65%.   Although no diagnostic regional wall motion abnormality was   identified, this possibility cannot be completely excluded on the   basis of this study. Features are consistent with a pseudonormal   left ventricular filling pattern, with concomitant abnormal   relaxation and increased filling pressure (grade 2 diastolic   dysfunction). - Aortic valve: There was no stenosis. - Mitral valve: Mildly calcified annulus. There was trivial   regurgitation. - Left atrium: The atrium was mildly to moderately dilated. - Right ventricle: The cavity size was normal. Systolic function   was normal. - Pulmonary arteries: No complete TR doppler jet so unable to   estimate PA  systolic pressure. - Inferior vena cava: The vessel was normal in size. The   respirophasic diameter changes were in the normal range (>= 50%),   consistent with normal central venous pressure.  Impressions:  - Normal LV size with mild LV hypertrophy. EF 60-65%. Moderate   diastolic dysfunction. Normal RV size and systolic function.   Moderate LAE. No significant valvular abnormalities.  Epic records are reviewed at length today  Assessment and Plan:  1. Persistent atrial fibrillation/atrial flutter The patient has paroxysmal/ persistent atrial fibrillation. No new episodes of afib noted on most recent remote device check however, he appears to be out of rhythm currently, asymptomatic. Overall AF burden about 2%. Given patient is asymptomatic, will not make any changes today. On [facility] he was in SR this AM. Continue Xarelto 20 mg daily  This patients CHA2DS2-VASc Score and unadjusted Ischemic Stroke Rate (% per year) is equal to 3.2 % stroke rate/year from a score of 3  Above score calculated as 1 point each if present [CHF, HTN, DM, Vascular=MI/PAD/Aortic Plaque, Age if 65-74, or Male] Above score calculated as 2 points each if present [Age > 75, or Stroke/TIA/TE]   2. Obstructive sleep apnea The importance of adequate treatment of sleep apnea was discussed today in order to improve our ability to maintain sinus rhythm long term. Encouraged compliance with CPAP.   3. HTN Stable, no changes today.  4. Anxiety Patient admits to significant anxiety which is impacting his quality of life. Encouraged him to reach out to his PCP, reassurance given.   Follow up in AF clinic in 6 months. [facility].   [facility] [address] [place],  [address] [phone] 08/28/2018 4:50 PM

## 2018-09-02 ENCOUNTER — Ambulatory Visit (INDEPENDENT_AMBULATORY_CARE_PROVIDER_SITE_OTHER): Payer: Medicare Other | Admitting: *Deleted

## 2018-09-02 DIAGNOSIS — I480 Paroxysmal atrial fibrillation: Secondary | ICD-10-CM | POA: Diagnosis not present

## 2018-09-02 LAB — CUP PACEART REMOTE DEVICE CHECK
Date Time Interrogation Session: 20200705014135
Implantable Pulse Generator Implant Date: 20180925

## 2018-09-05 ENCOUNTER — Telehealth: Payer: Self-pay | Admitting: Internal Medicine

## 2018-09-05 ENCOUNTER — Other Ambulatory Visit: Payer: Self-pay

## 2018-09-05 MED ORDER — RIVAROXABAN 20 MG PO TABS: ORAL_TABLET | ORAL | 1 refills | Status: DC

## 2018-09-05 NOTE — Addendum Note (Signed)
Addended by: Johny Shock B on: 09/05/2018 04:24 PM   Modules accepted: Orders

## 2018-09-05 NOTE — Telephone Encounter (Signed)
°*  STAT* If patient is at the pharmacy, call can be transferred to refill team.   1. Which medications need to be refilled? (please list name of each medication and dose if known) Xarelto 20 mg  2. Which pharmacy/location (including street and city if local pharmacy) is medication to be sent to? [facility]  3. Do they need a 30 day or 90 day supply? [place]

## 2018-09-05 NOTE — Telephone Encounter (Signed)
Prescription refill request for Xarelto received.   Last office visit: 08-28-2018 Bradley Short Weight: 201 lbs (02-13-2018) Age: 85 y.o. Scr: 1.39 (06-05-2018) CrCl: 51 ml/min  Prescription refill sent.

## 2018-09-09 NOTE — Progress Notes (Signed)
Carelink Summary Report / Loop Recorder 

## 2018-10-03 ENCOUNTER — Ambulatory Visit (INDEPENDENT_AMBULATORY_CARE_PROVIDER_SITE_OTHER): Payer: Medicare Other | Admitting: *Deleted

## 2018-10-03 DIAGNOSIS — I480 Paroxysmal atrial fibrillation: Secondary | ICD-10-CM

## 2018-10-04 LAB — CUP PACEART REMOTE DEVICE CHECK
Date Time Interrogation Session: 20200807021031
Implantable Pulse Generator Implant Date: 20180925

## 2018-10-08 NOTE — Progress Notes (Signed)
Carelink Summary Report / Loop Recorder 

## 2018-10-31 ENCOUNTER — Other Ambulatory Visit: Payer: Self-pay | Admitting: Internal Medicine

## 2018-11-05 ENCOUNTER — Ambulatory Visit (INDEPENDENT_AMBULATORY_CARE_PROVIDER_SITE_OTHER): Payer: Medicare Other | Admitting: *Deleted

## 2018-11-05 DIAGNOSIS — I480 Paroxysmal atrial fibrillation: Secondary | ICD-10-CM

## 2018-11-06 ENCOUNTER — Telehealth: Payer: Self-pay | Admitting: Internal Medicine

## 2018-11-06 LAB — CUP PACEART REMOTE DEVICE CHECK
Date Time Interrogation Session: 20200909101132
Implantable Pulse Generator Implant Date: 20180925

## 2018-11-06 MED ORDER — CARVEDILOL 6.25 MG PO TABS: 6.2500 mg | ORAL_TABLET | Freq: Two times a day (BID) | ORAL | 3 refills | Status: DC

## 2018-11-06 NOTE — Telephone Encounter (Signed)
Pt seen by afib clinic 08/2018.    Refilled medication

## 2018-11-06 NOTE — Telephone Encounter (Signed)
New message   Pt c/o medication issue:  1. Name of Medication:  carvedilol (COREG) 6.25 MG tablet     2. How are you currently taking this medication (dosage and times per day)?2 times daily  3. Are you having a reaction (difficulty breathing--STAT)?n/a   4. What is your medication issue?patient needs a new prescription it has expired please send to     [place], [place] [phone] (Phone) [phone] (Fax)

## 2018-11-20 NOTE — Progress Notes (Signed)
Carelink Summary Report / Loop Recorder 

## 2018-11-26 DIAGNOSIS — Z85828 Personal history of other malignant neoplasm of skin: Secondary | ICD-10-CM | POA: Diagnosis not present

## 2018-11-26 DIAGNOSIS — D1801 Hemangioma of skin and subcutaneous tissue: Secondary | ICD-10-CM | POA: Diagnosis not present

## 2018-11-26 DIAGNOSIS — L570 Actinic keratosis: Secondary | ICD-10-CM | POA: Diagnosis not present

## 2018-11-26 DIAGNOSIS — D485 Neoplasm of uncertain behavior of skin: Secondary | ICD-10-CM | POA: Diagnosis not present

## 2018-11-26 DIAGNOSIS — D0422 Carcinoma in situ of skin of left ear and external auricular canal: Secondary | ICD-10-CM | POA: Diagnosis not present

## 2018-12-05 DIAGNOSIS — L988 Other specified disorders of the skin and subcutaneous tissue: Secondary | ICD-10-CM | POA: Diagnosis not present

## 2018-12-05 DIAGNOSIS — C44229 Squamous cell carcinoma of skin of left ear and external auricular canal: Secondary | ICD-10-CM | POA: Diagnosis not present

## 2018-12-06 DIAGNOSIS — N183 Chronic kidney disease, stage 3 unspecified: Secondary | ICD-10-CM | POA: Diagnosis not present

## 2018-12-06 DIAGNOSIS — K2100 Gastro-esophageal reflux disease with esophagitis, without bleeding: Secondary | ICD-10-CM | POA: Diagnosis not present

## 2018-12-09 ENCOUNTER — Ambulatory Visit (INDEPENDENT_AMBULATORY_CARE_PROVIDER_SITE_OTHER): Payer: Medicare Other | Admitting: *Deleted

## 2018-12-09 DIAGNOSIS — I480 Paroxysmal atrial fibrillation: Secondary | ICD-10-CM | POA: Diagnosis not present

## 2018-12-10 LAB — CUP PACEART REMOTE DEVICE CHECK
Date Time Interrogation Session: 20201012213555
Implantable Pulse Generator Implant Date: 20180925

## 2018-12-11 DIAGNOSIS — I4891 Unspecified atrial fibrillation: Secondary | ICD-10-CM | POA: Diagnosis not present

## 2018-12-11 DIAGNOSIS — N183 Chronic kidney disease, stage 3 unspecified: Secondary | ICD-10-CM | POA: Diagnosis not present

## 2018-12-11 DIAGNOSIS — D696 Thrombocytopenia, unspecified: Secondary | ICD-10-CM | POA: Diagnosis not present

## 2018-12-11 DIAGNOSIS — Z23 Encounter for immunization: Secondary | ICD-10-CM | POA: Diagnosis not present

## 2018-12-11 DIAGNOSIS — Z6827 Body mass index (BMI) 27.0-27.9, adult: Secondary | ICD-10-CM | POA: Diagnosis not present

## 2018-12-11 DIAGNOSIS — M109 Gout, unspecified: Secondary | ICD-10-CM | POA: Diagnosis not present

## 2018-12-11 DIAGNOSIS — R7301 Impaired fasting glucose: Secondary | ICD-10-CM | POA: Diagnosis not present

## 2018-12-11 DIAGNOSIS — E7800 Pure hypercholesterolemia, unspecified: Secondary | ICD-10-CM | POA: Diagnosis not present

## 2018-12-18 NOTE — Progress Notes (Signed)
Carelink Summary Report / Loop Recorder 

## 2018-12-19 DIAGNOSIS — Z4802 Encounter for removal of sutures: Secondary | ICD-10-CM | POA: Diagnosis not present

## 2019-01-07 DIAGNOSIS — H524 Presbyopia: Secondary | ICD-10-CM | POA: Diagnosis not present

## 2019-01-07 DIAGNOSIS — H35373 Puckering of macula, bilateral: Secondary | ICD-10-CM | POA: Diagnosis not present

## 2019-01-11 LAB — CUP PACEART REMOTE DEVICE CHECK
Date Time Interrogation Session: 20201114194636
Implantable Pulse Generator Implant Date: 20180925

## 2019-01-13 ENCOUNTER — Ambulatory Visit (INDEPENDENT_AMBULATORY_CARE_PROVIDER_SITE_OTHER): Payer: Medicare Other | Admitting: *Deleted

## 2019-01-13 DIAGNOSIS — I4819 Other persistent atrial fibrillation: Secondary | ICD-10-CM | POA: Diagnosis not present

## 2019-01-13 DIAGNOSIS — R55 Syncope and collapse: Secondary | ICD-10-CM

## 2019-02-07 NOTE — Progress Notes (Signed)
Carelink Summary Report / Loop Recorder 

## 2019-02-13 ENCOUNTER — Ambulatory Visit (INDEPENDENT_AMBULATORY_CARE_PROVIDER_SITE_OTHER): Payer: Medicare Other | Admitting: *Deleted

## 2019-02-13 DIAGNOSIS — I4819 Other persistent atrial fibrillation: Secondary | ICD-10-CM

## 2019-02-13 LAB — CUP PACEART REMOTE DEVICE CHECK
Date Time Interrogation Session: 20201217164306
Implantable Pulse Generator Implant Date: 20180925

## 2019-03-04 ENCOUNTER — Telehealth: Payer: Self-pay

## 2019-03-04 ENCOUNTER — Ambulatory Visit (HOSPITAL_COMMUNITY)
Admission: RE | Admit: 2019-03-04 | Discharge: 2019-03-04 | Disposition: A | Discharge status: Home / Self Care | Payer: Medicare Other | Source: Ambulatory Visit | Attending: Physician Assistant | Admitting: Physician Assistant

## 2019-03-04 ENCOUNTER — Other Ambulatory Visit: Payer: Self-pay

## 2019-03-04 VITALS — BP 136/88 | HR 122 | Ht 70.0 in | Wt 202.8 lb

## 2019-03-04 DIAGNOSIS — R000 Tachycardia, unspecified: Secondary | ICD-10-CM | POA: Insufficient documentation

## 2019-03-04 DIAGNOSIS — Z8042 Family history of malignant neoplasm of prostate: Secondary | ICD-10-CM | POA: Diagnosis not present

## 2019-03-04 DIAGNOSIS — M109 Gout, unspecified: Secondary | ICD-10-CM | POA: Insufficient documentation

## 2019-03-04 DIAGNOSIS — I484 Atypical atrial flutter: Secondary | ICD-10-CM | POA: Diagnosis not present

## 2019-03-04 DIAGNOSIS — Z888 Allergy status to other drugs, medicaments and biological substances status: Secondary | ICD-10-CM | POA: Insufficient documentation

## 2019-03-04 DIAGNOSIS — I10 Essential (primary) hypertension: Secondary | ICD-10-CM | POA: Diagnosis not present

## 2019-03-04 DIAGNOSIS — I4819 Other persistent atrial fibrillation: Secondary | ICD-10-CM | POA: Diagnosis not present

## 2019-03-04 DIAGNOSIS — D6869 Other thrombophilia: Secondary | ICD-10-CM | POA: Diagnosis not present

## 2019-03-04 DIAGNOSIS — M1990 Unspecified osteoarthritis, unspecified site: Secondary | ICD-10-CM | POA: Diagnosis not present

## 2019-03-04 DIAGNOSIS — G4733 Obstructive sleep apnea (adult) (pediatric): Secondary | ICD-10-CM | POA: Diagnosis not present

## 2019-03-04 DIAGNOSIS — Z833 Family history of diabetes mellitus: Secondary | ICD-10-CM | POA: Insufficient documentation

## 2019-03-04 DIAGNOSIS — Z7901 Long term (current) use of anticoagulants: Secondary | ICD-10-CM | POA: Diagnosis not present

## 2019-03-04 DIAGNOSIS — Z85038 Personal history of other malignant neoplasm of large intestine: Secondary | ICD-10-CM | POA: Diagnosis not present

## 2019-03-04 DIAGNOSIS — Z809 Family history of malignant neoplasm, unspecified: Secondary | ICD-10-CM | POA: Diagnosis not present

## 2019-03-04 DIAGNOSIS — I4892 Unspecified atrial flutter: Secondary | ICD-10-CM | POA: Insufficient documentation

## 2019-03-04 DIAGNOSIS — Z79899 Other long term (current) drug therapy: Secondary | ICD-10-CM | POA: Insufficient documentation

## 2019-03-04 DIAGNOSIS — Z8249 Family history of ischemic heart disease and other diseases of the circulatory system: Secondary | ICD-10-CM | POA: Diagnosis not present

## 2019-03-04 MED ORDER — CARVEDILOL 6.25 MG PO TABS: 12.5000 mg | ORAL_TABLET | Freq: Two times a day (BID) | ORAL | 3 refills | Status: DC

## 2019-03-04 MED ORDER — RIVAROXABAN 20 MG PO TABS: ORAL_TABLET | ORAL | 1 refills | Status: DC

## 2019-03-04 NOTE — Patient Instructions (Signed)
Increase coreg to 12.5mg twice a day °

## 2019-03-04 NOTE — Progress Notes (Addendum)
Primary Care Physician: Manon Hilding, MD Primary Cardiologist: none Primary Electrophysiologist: Dr Rayann Heman Referring Physician: Dr Teressa Senter is a 86 y.o. male with a history of persistent atrial fibrillation who presents for follow up in the [facility]. Patient reports that he has felt well with no symptoms of heart racing or palpitations. He does admit that he has been VERY anxious lately about COVID-19 and other life stressors. He is on Xarelto for a CHADS2VASC score of 3.  On follow up today, patient reports that he feels well overall but does have some heart racing at times. No CP, SOB, or dizziness. AF burden on ILR up from 2% to 4% with afib occurring most days since 12/2018.  Today, he denies symptoms of chest pain, shortness of breath, orthopnea, PND, lower extremity edema, dizziness, presyncope, syncope, snoring, daytime somnolence, bleeding, or neurologic sequela. The patient is tolerating medications without difficulties and is otherwise without complaint today.    Atrial Fibrillation Risk Factors:  he does have symptoms or diagnosis of sleep apnea. he is not compliant with CPAP therapy.   he has a BMI of Body mass index is 29.1 kg/m.Marland Kitchen Filed Weights   03/04/19 1013  Weight: 92 kg    Family History  Problem Relation Age of Onset  . Prostate cancer Father   . Seizures Other   . Cancer Other   . Diabetes Other   . Heart disease Other      Atrial Fibrillation Management history:  Previous antiarrhythmic drugs: amiodarone, propafenone Previous cardioversions: none Previous ablations: 2012, 08/02/17 CHADS2VASC score: 3 Anticoagulation history: Xarelto   Past Medical History:  Diagnosis Date  . Arthritis   . Atrial fibrillation (HCC)    paroxysmal, s/p PVI by Dr Rayann Heman 8/12  . Atrial flutter ([place])    s/p CTI ablation  . Cancer [facility])    4th stage dysplasia mass in colon-removed 7/18  . Dysrhythmia    A. fib/a.  a. flutter  . Hyperlipidemia   . Obstructive sleep apnea (adult) (pediatric)    does not use cpap   Past Surgical History:  Procedure Laterality Date  . ATRIAL ABLATION SURGERY     s/p CTI and PVI ablations by Dr Rayann Heman 8/12  . ATRIAL FIBRILLATION ABLATION N/A 08/02/2017   Procedure: ATRIAL FIBRILLATION ABLATION;  Surgeon: [name], MD;  Location: [place] [facility];  Service: Cardiovascular;  Laterality: N/A;  . CARDIAC CATHETERIZATION     nonobstructive CAD 2000  . COLONOSCOPY WITH PROPOFOL N/A 08/24/2016   Procedure: COLONOSCOPY WITH PROPOFOL;  Surgeon: Teena Irani, MD;  [facility];  Service: Endoscopy;  Laterality: N/A;  . INSERTION OF MESH N/A 02/28/2017   Procedure: INSERTION OF MESH;  Surgeon: Jovita Kussmaul, MD;  Location: [place];  Service: General;  Laterality: N/A;  . LAPAROSCOPIC RIGHT COLECTOMY Right 08/28/2016   Procedure: LAPAROSCOPIC ASSISTED RIGHT COLECTOMY;  Surgeon: Jovita Kussmaul, MD;  [facility];  Service: General;  Laterality: Right;  . LOOP RECORDER INSERTION N/A 11/21/2016   Procedure: LOOP RECORDER INSERTION;  Surgeon: [name], MD;  Location: [place] [facility];  Service: Cardiovascular;  Laterality: N/A;  . MELANOMA EXCISION Right    shoulder  . VENTRAL HERNIA REPAIR  02/28/2017  . VENTRAL HERNIA REPAIR N/A 02/28/2017   Procedure: LAPAROSCOPIC VENTRAL HERNIA REPAIR WITH MESH;  Surgeon: Jovita Kussmaul, MD;  Location: [place];  Service: General;  Laterality: N/A;    Current  Outpatient Medications  Medication Sig Dispense Refill  . acetaminophen (TYLENOL) 325 MG tablet Take 325 mg by mouth every 6 (six) hours as needed for mild pain.    Marland Kitchen allopurinol (ZYLOPRIM) 100 MG tablet Take 100 mg by mouth daily.     . carvedilol (COREG) 6.25 MG tablet Take 2 tablets (12.5 mg total) by mouth 2 (two) times daily. 180 tablet 3  . colchicine 0.6 MG tablet Take 0.6 mg by mouth as needed (Gout).     . Multiple Vitamins-Minerals (MULTIVITAMIN WITH MINERALS)  tablet Take 1 tablet by mouth daily.    . rivaroxaban (XARELTO) 20 MG TABS tablet TAKE 1 TABLET BY MOUTH ONCE DAILY WITH  SUPPER 90 tablet 1   No current facility-administered medications for this encounter.    Allergies  Allergen Reactions  . Amiodarone     Developed severe drug rash while on Amiodarone and Diltiazem unclear which one of them was culprit  . Diltiazem Hcl     REACTION: Looked like burned all over  . Lisinopril     REACTION: Looked like burned all over  . Metoprolol Succinate     REACTION: Looked like burned all over  . Propafenone Hcl     REACTION: Looked like burned all over  . Robaxin [Methocarbamol]     Rash all over    Social History   Socioeconomic History  . Marital status: Married    Spouse name: Not on file  . Number of children: Not on file  . Years of education: Not on file  . Highest education level: Not on file  Occupational History  . Not on file  Tobacco Use  . Smoking status: Never Smoker  . Smokeless tobacco: Never Used  Substance and Sexual Activity  . Alcohol use: No  . Drug use: No  . Sexual activity: Not on file  Other Topics Concern  . 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 Difficulty of Paying Living Expenses: Not on file  Food Insecurity:   . Worried About Charity fundraiser in the Last Year: Not on file  . Ran Out of Food in the Last Year: Not on file  Transportation Needs:   . Lack of Transportation (Medical): Not on file  . Lack of Transportation (Non-Medical): Not on file  Physical Activity:   . Days of Exercise per Week: Not on file  . Minutes of Exercise per Session: Not on file  Stress:   . Feeling of Stress : Not on file  Social Connections:   . Frequency of Communication with Friends and Family: Not on file  . Frequency of Social Gatherings with Friends and Family: Not on file  . Attends Religious  Services: Not on file  . Active Member of Clubs or Organizations: Not on file  . Attends Archivist Meetings: Not on file  . Marital Status: Not on file  Intimate Partner Violence:   . Fear of Current or Ex-Partner: Not on file  . Emotionally Abused: Not on file  . Physically Abused: Not on file  . Sexually Abused: Not on file     ROS- All systems are reviewed and negative except as per the HPI above.  Physical Exam: Vitals:   03/04/19 1013  BP: 136/88  Pulse: (!) 122  Weight: 92 kg  Height: 5\' 10"  (1.778 m)  GEN- The patient is well appearing elderly male, alert and oriented x 3 today.   HEENT-head normocephalic, atraumatic, sclera clear, conjunctiva pink, hearing intact, trachea midline. Lungs- Clear to ausculation bilaterally, normal work of breathing Heart- Regular rhythm, tachycardia, no murmurs, rubs or gallops  GI- soft, NT, ND, + BS Extremities- no clubbing, cyanosis, or edema MS- no significant deformity or atrophy Skin- no rash or lesion Psych- euthymic mood, full affect Neuro- strength and sensation are intact   Wt Readings from Last 3 Encounters:  03/04/19 92 kg  08/28/18 90.3 kg  02/13/18 91.2 kg    EKG today demonstrates atypical flutter HR 122, QRS 78, QTc 433  Echo 08/23/16 demonstrated  - Procedure narrative: Transthoracic echocardiography. Image   quality was poor. The study was technically difficult, as a   result of poor sound wave transmission. - Left ventricle: The cavity size was normal. Wall thickness was   increased in a pattern of mild LVH. Systolic function was normal.   The estimated ejection fraction was in the range of 60% to 65%.   Although no diagnostic regional wall motion abnormality was   identified, this possibility cannot be completely excluded on the   basis of this study. Features are consistent with a pseudonormal   left ventricular filling pattern, with concomitant abnormal   relaxation and increased filling  pressure (grade 2 diastolic   dysfunction). - Aortic valve: There was no stenosis. - Mitral valve: Mildly calcified annulus. There was trivial   regurgitation. - Left atrium: The atrium was mildly to moderately dilated. - Right ventricle: The cavity size was normal. Systolic function   was normal. - Pulmonary arteries: No complete TR doppler jet so unable to   estimate PA systolic pressure. - Inferior vena cava: The vessel was normal in size. The   respirophasic diameter changes were in the normal range (>= 50%),   consistent with normal central venous pressure.  Impressions:  - Normal LV size with mild LV hypertrophy. EF 60-65%. Moderate   diastolic dysfunction. Normal RV size and systolic function.   Moderate LAE. No significant valvular abnormalities.  Epic records are reviewed at length today  Assessment and Plan:  1. Persistent atrial fibrillation/atrial flutter Overall AF burden increased from 2% to 4.3% with afib/flutter occurring on most days since November with rapid rates at times. We discussed therapeutic options today including dofetilide vs repeat ablation vs rate control. Patient would like to discuss his options with his wife before deciding and then talk with Dr Rayann Heman.  Will increase Coreg to 12.5 mg BID for better rate control in the meantime.  Continue Xarelto 20 mg daily  This patients CHA2DS2-VASc Score and unadjusted Ischemic Stroke Rate (% per year) is equal to 3.2 % stroke rate/year from a score of 3  Above score calculated as 1 point each if present [CHF, HTN, DM, Vascular=MI/PAD/Aortic Plaque, Age if 65-74, or Male] Above score calculated as 2 points each if present [Age > 75, or Stroke/TIA/TE]   2. Obstructive sleep apnea The importance of adequate treatment of sleep apnea was discussed today in order to improve our ability to maintain sinus rhythm long term. Encouraged compliance with CPAP therapy.  3. HTN Stable, med changes as  above.   Follow up with Dr Rayann Heman in 2-3 weeks to discuss ablation vs dofetilide.    [facility] [address] [place], [place] [address] [phone] 03/04/2019 11:40 AM

## 2019-03-05 ENCOUNTER — Telehealth: Payer: Self-pay

## 2019-03-05 ENCOUNTER — Telehealth (INDEPENDENT_AMBULATORY_CARE_PROVIDER_SITE_OTHER): Payer: Medicare Other | Admitting: Internal Medicine

## 2019-03-05 VITALS — BP 138/95 | HR 120 | Ht 70.0 in | Wt 202.0 lb

## 2019-03-05 DIAGNOSIS — D6869 Other thrombophilia: Secondary | ICD-10-CM | POA: Diagnosis not present

## 2019-03-05 DIAGNOSIS — I484 Atypical atrial flutter: Secondary | ICD-10-CM

## 2019-03-05 DIAGNOSIS — I10 Essential (primary) hypertension: Secondary | ICD-10-CM

## 2019-03-05 DIAGNOSIS — I480 Paroxysmal atrial fibrillation: Secondary | ICD-10-CM

## 2019-03-05 NOTE — Telephone Encounter (Signed)
Message from [name], MD sent at 03/05/2019  4:06 PM EST -----  He is going to take the vaccine prior to ablation  Afib ablation- first case of the day so that he feels comfortable going home the same day C/I/A  Cardiac CT

## 2019-03-05 NOTE — Telephone Encounter (Deleted)
-----   Message from [name], MD sent at 03/05/2019  4:06 PM EST -----  He is going to take the vaccine prior to ablation  Afib ablation- first case of the day so that he feels comfortable going home the same day C/I/A  Cardiac CT

## 2019-03-05 NOTE — Progress Notes (Signed)
Electrophysiology TeleHealth Note   Due to national recommendations of social distancing due to COVID 19, an audio/video telehealth visit is felt to be most appropriate for this patient at this time.  See MyChart message from today for the patient's consent to telehealth for [facility].   Date:  03/05/2019   ID:  Bradley Short, DOB 1932/12/13, MRN [id number]  Location: patient's home  Provider location:  [facility]  Evaluation Performed: Follow-up visit  PCP:  Manon Hilding, MD   Electrophysiologist:  Dr Rayann Heman  Chief Complaint:  palpitations  History of Present Illness:    Bradley Short is a 86 y.o. male who presents via telehealth conferencing today.  Since last being seen in our clinic, the patient reports doing very well.  He has had recurrence of his afib.  He has reduced exercise tolerance and SOB with activity.  Today, he denies symptoms of palpitations, chest pain,  lower extremity edema, dizziness, presyncope, or syncope.  The patient is otherwise without complaint today.  The patient denies symptoms of fevers, chills, cough, or new SOB worrisome for COVID 19.  Past Medical History:  Diagnosis Date  . Arthritis   . Atrial fibrillation (HCC)    paroxysmal, s/p PVI by Dr Rayann Heman 8/12  . Atrial flutter ([place])    s/p CTI ablation  . Cancer [facility])    4th stage dysplasia mass in colon-removed 7/18  . Dysrhythmia    A. fib/a. a. flutter  . Hyperlipidemia   . Obstructive sleep apnea (adult) (pediatric)    does not use cpap    Past Surgical History:  Procedure Laterality Date  . ATRIAL ABLATION SURGERY     s/p CTI and PVI ablations by Dr Rayann Heman 8/12  . ATRIAL FIBRILLATION ABLATION N/A 08/02/2017   Procedure: ATRIAL FIBRILLATION ABLATION;  Surgeon: [name], MD;  Location: [place] [facility];  Service: Cardiovascular;  Laterality: N/A;  . CARDIAC CATHETERIZATION     nonobstructive CAD 2000  . COLONOSCOPY WITH PROPOFOL N/A 08/24/2016   Procedure: COLONOSCOPY  WITH PROPOFOL;  Surgeon: Teena Irani, MD;  [facility];  Service: Endoscopy;  Laterality: N/A;  . INSERTION OF MESH N/A 02/28/2017   Procedure: INSERTION OF MESH;  Surgeon: Jovita Kussmaul, MD;  Location: [place];  Service: General;  Laterality: N/A;  . LAPAROSCOPIC RIGHT COLECTOMY Right 08/28/2016   Procedure: LAPAROSCOPIC ASSISTED RIGHT COLECTOMY;  Surgeon: Jovita Kussmaul, MD;  [facility];  Service: General;  Laterality: Right;  . LOOP RECORDER INSERTION N/A 11/21/2016   Procedure: LOOP RECORDER INSERTION;  Surgeon: [name], MD;  Location: [place] [facility];  Service: Cardiovascular;  Laterality: N/A;  . MELANOMA EXCISION Right    shoulder  . VENTRAL HERNIA REPAIR  02/28/2017  . VENTRAL HERNIA REPAIR N/A 02/28/2017   Procedure: LAPAROSCOPIC VENTRAL HERNIA REPAIR WITH MESH;  Surgeon: Jovita Kussmaul, MD;  Location: [place];  Service: General;  Laterality: N/A;    Current Outpatient Medications  Medication Sig Dispense Refill  . acetaminophen (TYLENOL) 325 MG tablet Take 325 mg by mouth every 6 (six) hours as needed for mild pain.    Marland Kitchen allopurinol (ZYLOPRIM) 100 MG tablet Take 100 mg by mouth daily.     . carvedilol (COREG) 6.25 MG tablet Take 2 tablets (12.5 mg total) by mouth 2 (two) times daily. 180 tablet 3  . colchicine 0.6 MG tablet Take 0.6 mg by mouth as needed (Gout).     . Multiple Vitamins-Minerals (MULTIVITAMIN  WITH MINERALS) tablet Take 1 tablet by mouth daily.    . rivaroxaban (XARELTO) 20 MG TABS tablet TAKE 1 TABLET BY MOUTH ONCE DAILY WITH  SUPPER 90 tablet 1   No current facility-administered medications for this visit.    Allergies:   Amiodarone, Diltiazem hcl, Lisinopril, Metoprolol succinate, Propafenone hcl, and Robaxin [methocarbamol]   Social History:  The patient  reports that he has never smoked. He has never used smokeless tobacco. He reports that he does not drink alcohol or use drugs.   Family History:  The patient's family history includes Cancer  in an other family member; Diabetes in an other family member; Heart disease in an other family member; Prostate cancer in his father; Seizures in an other family member.   ROS:  Please see the history of present illness.   All other systems are personally reviewed and negative.    Exam:    Vital Signs:  BP (!) 138/95   Pulse (!) 120   Ht 5\' 10"  (1.778 m)   Wt 202 lb (91.6 kg)   BMI 28.98 kg/m   Well sounding and appearing, alert and conversant, regular work of breathing,  good skin color Eyes- anicteric, neuro- grossly intact, skin- no apparent rash or lesions or cyanosis, mouth- oral mucosa is pink  Labs/Other Tests and Data Reviewed:    Recent Labs: No results found for requested labs within last 8760 hours.   Wt Readings from Last 3 Encounters:  03/05/19 202 lb (91.6 kg)  03/04/19 202 lb 12.8 oz (92 kg)  08/28/18 199 lb (90.3 kg)     Last device remote is reviewed from [facility] which reveals normal device function, afib noted on ILR   ASSESSMENT & PLAN:    1.  Persistent afib/ atypical atrial flutter He has had recurrent of his atrial arrhythmias and is quite symptomatic. He had a rash with medicinces previously including amiodarone, diltiazem and propafenone.  He is s/p PVI x 2.   Today, he presents to discuss rate control vs tikosyn vs third ablation. He was most recently in atypical atrial flutter  Chads2vasc score is 3.  he is anticoagulated with xarelto . Therapeutic strategies for afib including tikosyn and ablation were discussed in detail with the patient today. Risk, benefits, and alternatives to EP study and radiofrequency ablation for afib were also discussed in detail today. These risks include but are not limited to stroke, bleeding, vascular damage, tamponade, perforation, damage to the esophagus, lungs, and other structures, pulmonary vein stenosis, worsening renal function, and death. The patient understands these risk and wishes to proceed.  We will  therefore proceed with catheter ablation at the next available time.  Carto, ICE, anesthesia are requested for the procedure.  Will also obtain cardiac CT prior to the procedure to exclude LAA thrombus and further evaluate atrial anatomy.  2. HTN Stable No change required today  3. COVID screen I have encouraged him to obtain the covid 19 vaccine as he meets current criteria.  He and his wife will proceed.  He would like to have this done prior to ablation.   Patient Risk:  after full review of this patients clinical status, I feel that they are at high risk at this time.  Today, I have spent 25 minutes with the patient with telehealth technology discussing arrhythmia management .    Army Fossa, MD  03/05/2019 3:56 PM     [facility] [address] [place] [address] (  [phone] (office) [phone] (fax)

## 2019-03-15 NOTE — Progress Notes (Signed)
ILR remote 

## 2019-03-17 DIAGNOSIS — Z23 Encounter for immunization: Secondary | ICD-10-CM | POA: Diagnosis not present

## 2019-03-18 ENCOUNTER — Ambulatory Visit (INDEPENDENT_AMBULATORY_CARE_PROVIDER_SITE_OTHER): Payer: Medicare Other | Admitting: *Deleted

## 2019-03-18 DIAGNOSIS — I480 Paroxysmal atrial fibrillation: Secondary | ICD-10-CM

## 2019-03-19 LAB — CUP PACEART REMOTE DEVICE CHECK
Date Time Interrogation Session: 20210119164433
Implantable Pulse Generator Implant Date: 20180925

## 2019-04-01 ENCOUNTER — Telehealth: Payer: Self-pay | Admitting: Internal Medicine

## 2019-04-01 MED ORDER — CARVEDILOL 12.5 MG PO TABS: 12.5000 mg | ORAL_TABLET | Freq: Two times a day (BID) | ORAL | 3 refills | Status: DC

## 2019-04-01 NOTE — Telephone Encounter (Signed)
Prescription updated and sent to pharmacy on file.  See mychart message.

## 2019-04-01 NOTE — Telephone Encounter (Signed)
Pt c/o medication issue:  1. Name of Medication: carvedilol (COREG) 6.25 MG tablet  2. How are you currently taking this medication (dosage and times per day)? 2 tablets by mouth 2 times daily  3. Are you having a reaction (difficulty breathing--STAT)? No  4. What is your medication issue? Patient's wife is calling stating the patient is requesting the dosage of the  medication be raised to 12.5 MG so he doesn't have to take so many tablets through out the day. Please advise.

## 2019-04-02 NOTE — Telephone Encounter (Signed)
Spoke with Pt's wife and notified of medication update/refill.

## 2019-04-08 DIAGNOSIS — I482 Chronic atrial fibrillation, unspecified: Secondary | ICD-10-CM | POA: Diagnosis not present

## 2019-04-08 DIAGNOSIS — E7800 Pure hypercholesterolemia, unspecified: Secondary | ICD-10-CM | POA: Diagnosis not present

## 2019-04-08 DIAGNOSIS — N183 Chronic kidney disease, stage 3 unspecified: Secondary | ICD-10-CM | POA: Diagnosis not present

## 2019-04-08 DIAGNOSIS — F411 Generalized anxiety disorder: Secondary | ICD-10-CM | POA: Diagnosis not present

## 2019-04-08 DIAGNOSIS — M109 Gout, unspecified: Secondary | ICD-10-CM | POA: Diagnosis not present

## 2019-04-08 DIAGNOSIS — K2100 Gastro-esophageal reflux disease with esophagitis, without bleeding: Secondary | ICD-10-CM | POA: Diagnosis not present

## 2019-04-08 DIAGNOSIS — I4891 Unspecified atrial fibrillation: Secondary | ICD-10-CM | POA: Diagnosis not present

## 2019-04-08 DIAGNOSIS — R7301 Impaired fasting glucose: Secondary | ICD-10-CM | POA: Diagnosis not present

## 2019-04-14 DIAGNOSIS — Z23 Encounter for immunization: Secondary | ICD-10-CM | POA: Diagnosis not present

## 2019-04-15 DIAGNOSIS — R7301 Impaired fasting glucose: Secondary | ICD-10-CM | POA: Diagnosis not present

## 2019-04-15 DIAGNOSIS — D696 Thrombocytopenia, unspecified: Secondary | ICD-10-CM | POA: Diagnosis not present

## 2019-04-15 DIAGNOSIS — Z6827 Body mass index (BMI) 27.0-27.9, adult: Secondary | ICD-10-CM | POA: Diagnosis not present

## 2019-04-15 DIAGNOSIS — M109 Gout, unspecified: Secondary | ICD-10-CM | POA: Diagnosis not present

## 2019-04-21 ENCOUNTER — Ambulatory Visit (INDEPENDENT_AMBULATORY_CARE_PROVIDER_SITE_OTHER): Payer: Medicare Other | Admitting: *Deleted

## 2019-04-21 DIAGNOSIS — I480 Paroxysmal atrial fibrillation: Secondary | ICD-10-CM

## 2019-04-21 LAB — CUP PACEART REMOTE DEVICE CHECK
Date Time Interrogation Session: 20210222002031
Implantable Pulse Generator Implant Date: 20180925

## 2019-04-21 NOTE — Progress Notes (Signed)
ILR Remote 

## 2019-05-22 ENCOUNTER — Ambulatory Visit (INDEPENDENT_AMBULATORY_CARE_PROVIDER_SITE_OTHER): Payer: Medicare Other | Admitting: *Deleted

## 2019-05-22 DIAGNOSIS — I480 Paroxysmal atrial fibrillation: Secondary | ICD-10-CM | POA: Diagnosis not present

## 2019-05-22 LAB — CUP PACEART REMOTE DEVICE CHECK
Date Time Interrogation Session: 20210325012137
Implantable Pulse Generator Implant Date: 20180925

## 2019-05-22 NOTE — Progress Notes (Signed)
ILR Remote 

## 2019-06-03 DIAGNOSIS — Z85828 Personal history of other malignant neoplasm of skin: Secondary | ICD-10-CM | POA: Diagnosis not present

## 2019-06-03 DIAGNOSIS — D1801 Hemangioma of skin and subcutaneous tissue: Secondary | ICD-10-CM | POA: Diagnosis not present

## 2019-06-03 DIAGNOSIS — L570 Actinic keratosis: Secondary | ICD-10-CM | POA: Diagnosis not present

## 2019-06-23 LAB — CUP PACEART REMOTE DEVICE CHECK
Date Time Interrogation Session: 20210425012706
Implantable Pulse Generator Implant Date: 20180925

## 2019-06-24 ENCOUNTER — Ambulatory Visit (INDEPENDENT_AMBULATORY_CARE_PROVIDER_SITE_OTHER): Payer: Medicare Other | Admitting: *Deleted

## 2019-06-24 DIAGNOSIS — I480 Paroxysmal atrial fibrillation: Secondary | ICD-10-CM | POA: Diagnosis not present

## 2019-06-25 NOTE — Progress Notes (Signed)
ILR Remote 

## 2019-06-30 ENCOUNTER — Telehealth: Payer: Self-pay

## 2019-06-30 NOTE — Telephone Encounter (Signed)
See mychart messages

## 2019-07-23 ENCOUNTER — Ambulatory Visit (INDEPENDENT_AMBULATORY_CARE_PROVIDER_SITE_OTHER): Payer: Medicare Other | Admitting: *Deleted

## 2019-07-23 DIAGNOSIS — I4819 Other persistent atrial fibrillation: Secondary | ICD-10-CM | POA: Diagnosis not present

## 2019-07-23 LAB — CUP PACEART REMOTE DEVICE CHECK
Date Time Interrogation Session: 20210526014733
Implantable Pulse Generator Implant Date: 20180925

## 2019-07-24 NOTE — Progress Notes (Signed)
Carelink Summary Report / Loop Recorder 

## 2019-08-25 ENCOUNTER — Ambulatory Visit (INDEPENDENT_AMBULATORY_CARE_PROVIDER_SITE_OTHER): Payer: Medicare Other | Admitting: *Deleted

## 2019-08-25 DIAGNOSIS — I4819 Other persistent atrial fibrillation: Secondary | ICD-10-CM | POA: Diagnosis not present

## 2019-08-25 LAB — CUP PACEART REMOTE DEVICE CHECK
Date Time Interrogation Session: 20210628025352
Implantable Pulse Generator Implant Date: 20180925

## 2019-08-26 NOTE — Progress Notes (Signed)
Carelink Summary Report / Loop Recorder 

## 2019-09-09 DIAGNOSIS — D696 Thrombocytopenia, unspecified: Secondary | ICD-10-CM | POA: Diagnosis not present

## 2019-09-09 DIAGNOSIS — Z6828 Body mass index (BMI) 28.0-28.9, adult: Secondary | ICD-10-CM | POA: Diagnosis not present

## 2019-09-09 DIAGNOSIS — R7301 Impaired fasting glucose: Secondary | ICD-10-CM | POA: Diagnosis not present

## 2019-09-09 DIAGNOSIS — M109 Gout, unspecified: Secondary | ICD-10-CM | POA: Diagnosis not present

## 2019-09-09 DIAGNOSIS — Z0001 Encounter for general adult medical examination with abnormal findings: Secondary | ICD-10-CM | POA: Diagnosis not present

## 2019-09-19 ENCOUNTER — Other Ambulatory Visit (HOSPITAL_COMMUNITY): Payer: Self-pay | Admitting: Physician Assistant

## 2019-09-29 ENCOUNTER — Ambulatory Visit (INDEPENDENT_AMBULATORY_CARE_PROVIDER_SITE_OTHER): Payer: Medicare Other | Admitting: *Deleted

## 2019-09-29 DIAGNOSIS — I4819 Other persistent atrial fibrillation: Secondary | ICD-10-CM | POA: Diagnosis not present

## 2019-09-29 LAB — CUP PACEART REMOTE DEVICE CHECK
Date Time Interrogation Session: 20210731030452
Implantable Pulse Generator Implant Date: 20180925

## 2019-10-01 NOTE — Progress Notes (Signed)
Carelink Summary Report / Loop Recorder 

## 2019-11-02 LAB — CUP PACEART REMOTE DEVICE CHECK
Date Time Interrogation Session: 20210902030626
Implantable Pulse Generator Implant Date: 20180925

## 2019-11-04 ENCOUNTER — Ambulatory Visit (INDEPENDENT_AMBULATORY_CARE_PROVIDER_SITE_OTHER): Payer: Medicare Other | Admitting: *Deleted

## 2019-11-04 DIAGNOSIS — I480 Paroxysmal atrial fibrillation: Secondary | ICD-10-CM

## 2019-11-05 NOTE — Progress Notes (Signed)
Carelink Summary Report / Loop Recorder 

## 2019-12-04 LAB — CUP PACEART REMOTE DEVICE CHECK
Date Time Interrogation Session: 20211005030906
Implantable Pulse Generator Implant Date: 20180925

## 2019-12-05 DIAGNOSIS — Z23 Encounter for immunization: Secondary | ICD-10-CM | POA: Diagnosis not present

## 2019-12-08 ENCOUNTER — Ambulatory Visit (INDEPENDENT_AMBULATORY_CARE_PROVIDER_SITE_OTHER): Payer: Medicare Other

## 2019-12-08 DIAGNOSIS — Z85828 Personal history of other malignant neoplasm of skin: Secondary | ICD-10-CM | POA: Diagnosis not present

## 2019-12-08 DIAGNOSIS — I480 Paroxysmal atrial fibrillation: Secondary | ICD-10-CM | POA: Diagnosis not present

## 2019-12-08 DIAGNOSIS — L570 Actinic keratosis: Secondary | ICD-10-CM | POA: Diagnosis not present

## 2019-12-08 DIAGNOSIS — D239 Other benign neoplasm of skin, unspecified: Secondary | ICD-10-CM | POA: Diagnosis not present

## 2019-12-10 NOTE — Progress Notes (Signed)
Carelink Summary Report / Loop Recorder 

## 2019-12-15 ENCOUNTER — Other Ambulatory Visit (HOSPITAL_COMMUNITY): Payer: Self-pay | Admitting: Physician Assistant

## 2019-12-16 NOTE — Telephone Encounter (Addendum)
Xarelto 20mg  refill request received. Pt is 87 years old, weight-91.6kg, Crea-1.43 on 09/02/19 via [facility] from [facility], last seen by Dr. Rayann Heman on 03/05/2019, Diagnosis-Afib, [place].61ml/min; Dose is inappropriate based on dosing criteria.   Last refill was sent on 09/19/2019 for a 3 month supply; will send a message to to Dr. Rayann Heman regarding this.

## 2019-12-23 ENCOUNTER — Telehealth: Payer: Self-pay | Admitting: *Deleted

## 2019-12-23 MED ORDER — RIVAROXABAN 15 MG PO TABS: 15.0000 mg | ORAL_TABLET | Freq: Every day | ORAL | 1 refills | Status: DC

## 2019-12-23 NOTE — Telephone Encounter (Signed)
-----   Message from [name], MD sent at 12/21/2019 11:01 AM EDT ----- I am ok with you reducing dose to 15mg  daily.    ----- Message ----- From: Marcos Eke, RN Sent: 12/16/2019   2:57 PM EDT To: [name], MD  Dr. Rayann Heman,  This patient has requested a Xarelto 20mg  refill and per dosing criteria he is on the incorrect dose. He is 86 years old, weight-91.6kg, Crea-1.43 on 09/02/19 from his PCP office, Diagnosis-Afib, CrCl-48.70ml/min. Please advise on dosing or if you want updated labs, etc.  Thanks, Derrel Nip, RN

## 2019-12-23 NOTE — Telephone Encounter (Addendum)
Message from Dr. Rayann Heman regarding reducing Xarelto dose:  Bradley Grayer, MD  [name], RN I am ok with you reducing dose to 46m daily.       Previous Messages  ----- Message -----  From: HMarcos Eke RN  Sent: 12/16/2019  2:57 PM EDT  To: JThompson Grayer MD   Dr. ARayann Heman  This patient has requested a Xarelto 217mrefill and per dosing criteria he is on the incorrect dose. He is [age]ears old, weight-91.6kg, Crea-1.43 on 09/02/19 from his PCP office, Diagnosis-Afib, CrCl-48.9375min. Please advise on dosing or if you want updated labs, etc.  Thanks,  [name]    12/23/19 at 1025am: Called pt to inform him of the dose change; he was appreciative of the information and how concerned we are to make sure his well being is met so thoroughly.

## 2019-12-23 NOTE — Telephone Encounter (Signed)
Called the pt to update him regarding the Xarelto dose change from 20mg  to 15mg  tablet. Pt states he is at [facility] at this time and has the 20mg  tablets and once he completes them he will start the 15mg  tablets that I am sending in today. He is aware to call back if any issues.

## 2020-01-04 LAB — CUP PACEART REMOTE DEVICE CHECK
Date Time Interrogation Session: 20211107022925
Implantable Pulse Generator Implant Date: 20180925

## 2020-01-12 ENCOUNTER — Ambulatory Visit (INDEPENDENT_AMBULATORY_CARE_PROVIDER_SITE_OTHER): Payer: Medicare Other

## 2020-01-12 DIAGNOSIS — I480 Paroxysmal atrial fibrillation: Secondary | ICD-10-CM

## 2020-01-12 NOTE — Progress Notes (Signed)
Carelink Summary Report / Loop Recorder 

## 2020-02-16 ENCOUNTER — Ambulatory Visit (INDEPENDENT_AMBULATORY_CARE_PROVIDER_SITE_OTHER): Payer: Medicare Other

## 2020-02-16 DIAGNOSIS — R002 Palpitations: Secondary | ICD-10-CM | POA: Diagnosis not present

## 2020-02-16 DIAGNOSIS — I480 Paroxysmal atrial fibrillation: Secondary | ICD-10-CM

## 2020-02-16 LAB — CUP PACEART REMOTE DEVICE CHECK
Date Time Interrogation Session: 20211218232725
Implantable Pulse Generator Implant Date: 20180925

## 2020-02-17 DIAGNOSIS — H353131 Nonexudative age-related macular degeneration, bilateral, early dry stage: Secondary | ICD-10-CM | POA: Diagnosis not present

## 2020-02-17 DIAGNOSIS — H524 Presbyopia: Secondary | ICD-10-CM | POA: Diagnosis not present

## 2020-03-01 NOTE — Progress Notes (Signed)
Carelink Summary Report / Loop Recorder 

## 2020-03-19 LAB — CUP PACEART REMOTE DEVICE CHECK
Date Time Interrogation Session: 20220121005524
Implantable Pulse Generator Implant Date: 20180925

## 2020-03-22 ENCOUNTER — Ambulatory Visit (INDEPENDENT_AMBULATORY_CARE_PROVIDER_SITE_OTHER): Payer: Medicare Other

## 2020-03-22 DIAGNOSIS — I4819 Other persistent atrial fibrillation: Secondary | ICD-10-CM | POA: Diagnosis not present

## 2020-03-25 ENCOUNTER — Other Ambulatory Visit: Payer: Self-pay | Admitting: Internal Medicine

## 2020-04-01 NOTE — Progress Notes (Signed)
Carelink Summary Report / Loop Recorder 

## 2020-04-22 LAB — CUP PACEART REMOTE DEVICE CHECK
Date Time Interrogation Session: 20220223021100
Implantable Pulse Generator Implant Date: 20180925

## 2020-04-26 ENCOUNTER — Ambulatory Visit (INDEPENDENT_AMBULATORY_CARE_PROVIDER_SITE_OTHER): Payer: Medicare Other

## 2020-04-26 ENCOUNTER — Other Ambulatory Visit: Payer: Self-pay | Admitting: Internal Medicine

## 2020-04-26 DIAGNOSIS — I4819 Other persistent atrial fibrillation: Secondary | ICD-10-CM | POA: Diagnosis not present

## 2020-05-03 NOTE — Progress Notes (Signed)
Carelink Summary Report / Loop Recorder 

## 2020-05-06 ENCOUNTER — Ambulatory Visit (INDEPENDENT_AMBULATORY_CARE_PROVIDER_SITE_OTHER): Payer: Medicare Other | Admitting: Internal Medicine

## 2020-05-06 ENCOUNTER — Encounter: Payer: Self-pay | Admitting: Internal Medicine

## 2020-05-06 ENCOUNTER — Other Ambulatory Visit: Payer: Self-pay

## 2020-05-06 VITALS — BP 132/70 | HR 93 | Ht 70.0 in | Wt 200.8 lb

## 2020-05-06 DIAGNOSIS — I484 Atypical atrial flutter: Secondary | ICD-10-CM

## 2020-05-06 DIAGNOSIS — I4819 Other persistent atrial fibrillation: Secondary | ICD-10-CM | POA: Diagnosis not present

## 2020-05-06 DIAGNOSIS — I10 Essential (primary) hypertension: Secondary | ICD-10-CM | POA: Diagnosis not present

## 2020-05-06 MED ORDER — CARVEDILOL 12.5 MG PO TABS: 12.5000 mg | ORAL_TABLET | Freq: Two times a day (BID) | ORAL | 3 refills | Status: DC

## 2020-05-06 MED ORDER — RIVAROXABAN 15 MG PO TABS: 15.0000 mg | ORAL_TABLET | Freq: Every day | ORAL | 3 refills | Status: DC

## 2020-05-06 NOTE — Patient Instructions (Addendum)
Medication Instructions:  Your physician recommends that you continue on your current medications as directed. Please refer to the Current Medication list given to you today.  Labwork: CBC, BMET  Testing/Procedures: None ordered.  Follow-Up:  Your physician wants you to follow-up in: One year with [name], MD    You will receive a reminder letter in the mail two months in advance. If you don't receive a letter, please call our office to schedule the follow-up appointment.  Any Other Special Instructions Will Be Listed Below (If Applicable).  If you need a refill on your cardiac medications before your next appointment, please call your pharmacy.   Afib Clinic phone number: [phone].

## 2020-05-06 NOTE — Progress Notes (Signed)
PCP: Manon Hilding, MD   Primary EP: Dr Teressa Senter is a 87 y.o. male who presents today for routine electrophysiology followup.  Since last being seen in our clinic, the patient reports doing very well. He is not aware of afib episodes.  Today, he denies symptoms of palpitations, chest pain, shortness of breath,  lower extremity edema, dizziness, presyncope, or syncope.  The patient is otherwise without complaint today.   Past Medical History:  Diagnosis Date  . Arthritis   . Atrial fibrillation (HCC)    paroxysmal, s/p PVI by Dr Rayann Heman 8/12  . Atrial flutter ([place])    s/p CTI ablation  . Cancer [facility])    4th stage dysplasia mass in colon-removed 7/18  . Dysrhythmia    A. fib/a. a. flutter  . Hyperlipidemia   . Obstructive sleep apnea (adult) (pediatric)    does not use cpap   Past Surgical History:  Procedure Laterality Date  . ATRIAL ABLATION SURGERY     s/p CTI and PVI ablations by Dr Rayann Heman 8/12  . ATRIAL FIBRILLATION ABLATION N/A 08/02/2017   Procedure: ATRIAL FIBRILLATION ABLATION;  Surgeon: [name], MD;  Location: [place] [facility];  Service: Cardiovascular;  Laterality: N/A;  . CARDIAC CATHETERIZATION     nonobstructive CAD 2000  . COLONOSCOPY WITH PROPOFOL N/A 08/24/2016   Procedure: COLONOSCOPY WITH PROPOFOL;  Surgeon: Teena Irani, MD;  [facility];  Service: Endoscopy;  Laterality: N/A;  . INSERTION OF MESH N/A 02/28/2017   Procedure: INSERTION OF MESH;  Surgeon: Jovita Kussmaul, MD;  Location: [place];  Service: General;  Laterality: N/A;  . LAPAROSCOPIC RIGHT COLECTOMY Right 08/28/2016   Procedure: LAPAROSCOPIC ASSISTED RIGHT COLECTOMY;  Surgeon: Jovita Kussmaul, MD;  [facility];  Service: General;  Laterality: Right;  . LOOP RECORDER INSERTION N/A 11/21/2016   Procedure: LOOP RECORDER INSERTION;  Surgeon: [name], MD;  Location: [place] [facility];  Service: Cardiovascular;  Laterality: N/A;  . MELANOMA EXCISION Right    shoulder   . VENTRAL HERNIA REPAIR  02/28/2017  . VENTRAL HERNIA REPAIR N/A 02/28/2017   Procedure: LAPAROSCOPIC VENTRAL HERNIA REPAIR WITH MESH;  Surgeon: Jovita Kussmaul, MD;  Location: [place];  Service: General;  Laterality: N/A;    ROS- all systems are reviewed and negatives except as per HPI above  Current Outpatient Medications  Medication Sig Dispense Refill  . acetaminophen (TYLENOL) 325 MG tablet Take 325 mg by mouth every 6 (six) hours as needed for mild pain.    Marland Kitchen allopurinol (ZYLOPRIM) 100 MG tablet Take 100 mg by mouth daily.     . carvedilol (COREG) 12.5 MG tablet Take 1 tablet (12.5 mg total) by mouth 2 (two) times daily. Please make overdue appt with Dr. Rayann Heman before anymore refills. Thank you 2nd attempt 30 tablet 0  . colchicine 0.6 MG tablet Take 0.6 mg by mouth as needed (Gout).     . Multiple Vitamins-Minerals (MULTIVITAMIN WITH MINERALS) tablet Take 1 tablet by mouth daily.    . Rivaroxaban (XARELTO) 15 MG TABS tablet Take 1 tablet (15 mg total) by mouth daily with supper. 90 tablet 1   No current facility-administered medications for this visit.    Physical Exam: Vitals:   05/06/20 1613  BP: 132/70  Pulse: 93  SpO2: 98%  Weight: 200 lb 12.8 oz (91.1 kg)  Height: 5\' 10"  (1.778 m)    GEN- The patient is well appearing, alert and oriented x 3  today.   Head- normocephalic, atraumatic Eyes-  Sclera clear, conjunctiva pink Ears- hearing intact Oropharynx- clear Lungs- Clear to ausculation bilaterally, normal work of breathing Heart- irregular rate and rhythm  GI- soft, NT, ND, + BS Extremities- no clubbing, cyanosis, or edema  Wt Readings from Last 3 Encounters:  05/06/20 200 lb 12.8 oz (91.1 kg)  03/05/19 202 lb (91.6 kg)  03/04/19 202 lb 12.8 oz (92 kg)    EKG tracing ordered today is personally reviewed and shows afib, V rate 93 bpm  Assessment and Plan:  1. Persistent afib/ atypical atrial flutter Doing well  arrhythmia burden is 8.6% by ILR He is in afib  today and not aware. Asymptomatic recently chads2vasc score is 3.  He is on xarelto. He is s/p PVI x 2 He had a rash with medicinces previously including amiodarone, diltiazem and propafenone.   Bmet, cbc today  2. HTN Stable No change required today bmet  Risks, benefits and potential toxicities for medications prescribed and/or refilled reviewed with patient today.   [name] MD, [facility] 05/06/2020 4:29 PM

## 2020-05-07 LAB — BASIC METABOLIC PANEL
BUN/Creatinine Ratio: 11 (ref 10–24)
BUN: 15 mg/dL (ref 8–27)
CO2: 23 mmol/L (ref 20–29)
Calcium: 9.7 mg/dL (ref 8.6–10.2)
Chloride: 106 mmol/L (ref 96–106)
Creatinine, Ser: 1.32 mg/dL — ABNORMAL HIGH (ref 0.76–1.27)
Glucose: 93 mg/dL (ref 65–99)
Potassium: 4.8 mmol/L (ref 3.5–5.2)
Sodium: 143 mmol/L (ref 134–144)
eGFR: 53 mL/min/{1.73_m2} — ABNORMAL LOW (ref >59)

## 2020-05-07 LAB — CBC
Hematocrit: 46.3 % (ref 37.5–51.0)
Hemoglobin: 16 g/dL (ref 13.0–17.7)
MCH: 32.2 pg (ref 26.6–33.0)
MCHC: 34.6 g/dL (ref 31.5–35.7)
MCV: 93 fL (ref 79–97)
Platelets: 149 10*3/uL — ABNORMAL LOW (ref 150–450)
RBC: 4.97 x10E6/uL (ref 4.14–5.80)
RDW: 13.9 % (ref 11.6–15.4)
WBC: 10.8 10*3/uL (ref 3.4–10.8)

## 2020-05-24 ENCOUNTER — Ambulatory Visit (INDEPENDENT_AMBULATORY_CARE_PROVIDER_SITE_OTHER): Payer: Medicare Other

## 2020-05-24 DIAGNOSIS — I4819 Other persistent atrial fibrillation: Secondary | ICD-10-CM | POA: Diagnosis not present

## 2020-05-24 LAB — CUP PACEART REMOTE DEVICE CHECK
Date Time Interrogation Session: 20220328031339
Implantable Pulse Generator Implant Date: 20180925

## 2020-06-01 DIAGNOSIS — L570 Actinic keratosis: Secondary | ICD-10-CM | POA: Diagnosis not present

## 2020-06-03 NOTE — Progress Notes (Signed)
Carelink Summary Report / Loop Recorder 

## 2020-06-28 ENCOUNTER — Ambulatory Visit (INDEPENDENT_AMBULATORY_CARE_PROVIDER_SITE_OTHER): Payer: Medicare Other

## 2020-06-28 DIAGNOSIS — I4819 Other persistent atrial fibrillation: Secondary | ICD-10-CM

## 2020-06-28 LAB — CUP PACEART REMOTE DEVICE CHECK
Date Time Interrogation Session: 20220430031421
Implantable Pulse Generator Implant Date: 20180925

## 2020-07-01 ENCOUNTER — Telehealth: Payer: Self-pay

## 2020-07-01 NOTE — Telephone Encounter (Signed)
Carelink alert report received.  ILR has reached RRT.    Attempted to reach pt to discuss next steps.  No answer, LVM with device clinic # and hours to return call.

## 2020-07-01 NOTE — Telephone Encounter (Signed)
Spoke with patient.  Advised device battery has reached RRT.  Patient and wife on phone asking if device should be replaced.  Advised that that will need to be discussed with Dr. Rayann Heman.  Typically we do not replace these devices.  Patient indicated desire to discuss with Dr. Rayann Heman before discontinuing monitoring.  Stressed that at anytime, the device battery will hit EOS and stop transmitting data.  Advised I will have scheduling contact them to set up appointment.

## 2020-07-16 NOTE — Progress Notes (Signed)
Carelink Summary Report / Loop Recorder 

## 2020-08-02 ENCOUNTER — Ambulatory Visit (INDEPENDENT_AMBULATORY_CARE_PROVIDER_SITE_OTHER): Payer: Medicare Other

## 2020-08-02 DIAGNOSIS — I4819 Other persistent atrial fibrillation: Secondary | ICD-10-CM

## 2020-08-02 LAB — CUP PACEART REMOTE DEVICE CHECK
Date Time Interrogation Session: 20220602031550
Implantable Pulse Generator Implant Date: 20180925

## 2020-08-09 DIAGNOSIS — R7301 Impaired fasting glucose: Secondary | ICD-10-CM | POA: Diagnosis not present

## 2020-08-09 DIAGNOSIS — N183 Chronic kidney disease, stage 3 unspecified: Secondary | ICD-10-CM | POA: Diagnosis not present

## 2020-08-09 DIAGNOSIS — K2100 Gastro-esophageal reflux disease with esophagitis, without bleeding: Secondary | ICD-10-CM | POA: Diagnosis not present

## 2020-08-09 DIAGNOSIS — M109 Gout, unspecified: Secondary | ICD-10-CM | POA: Diagnosis not present

## 2020-08-09 DIAGNOSIS — E785 Hyperlipidemia, unspecified: Secondary | ICD-10-CM | POA: Diagnosis not present

## 2020-08-12 DIAGNOSIS — E7801 Familial hypercholesterolemia: Secondary | ICD-10-CM | POA: Diagnosis not present

## 2020-08-12 DIAGNOSIS — R7301 Impaired fasting glucose: Secondary | ICD-10-CM | POA: Diagnosis not present

## 2020-08-12 DIAGNOSIS — M109 Gout, unspecified: Secondary | ICD-10-CM | POA: Diagnosis not present

## 2020-08-12 DIAGNOSIS — F411 Generalized anxiety disorder: Secondary | ICD-10-CM | POA: Diagnosis not present

## 2020-08-12 DIAGNOSIS — K2100 Gastro-esophageal reflux disease with esophagitis, without bleeding: Secondary | ICD-10-CM | POA: Diagnosis not present

## 2020-08-12 DIAGNOSIS — D696 Thrombocytopenia, unspecified: Secondary | ICD-10-CM | POA: Diagnosis not present

## 2020-08-13 ENCOUNTER — Encounter: Payer: Self-pay | Admitting: Internal Medicine

## 2020-08-13 ENCOUNTER — Other Ambulatory Visit: Payer: Self-pay

## 2020-08-13 ENCOUNTER — Ambulatory Visit (INDEPENDENT_AMBULATORY_CARE_PROVIDER_SITE_OTHER): Payer: Medicare Other | Admitting: Internal Medicine

## 2020-08-13 VITALS — BP 114/68 | HR 84 | Ht 70.0 in | Wt 203.0 lb

## 2020-08-13 DIAGNOSIS — I4819 Other persistent atrial fibrillation: Secondary | ICD-10-CM

## 2020-08-13 DIAGNOSIS — I484 Atypical atrial flutter: Secondary | ICD-10-CM

## 2020-08-13 DIAGNOSIS — I10 Essential (primary) hypertension: Secondary | ICD-10-CM | POA: Diagnosis not present

## 2020-08-13 NOTE — Patient Instructions (Signed)
Medication Instructions:  Your physician recommends that you continue on your current medications as directed. Please refer to the Current Medication list given to you today.  Labwork: None ordered.  Testing/Procedures: None ordered.  Follow-Up: Your physician wants you to follow-up in: 6 months with [name], MD   For possible loop removal and reimplant.    Any Other Special Instructions Will Be Listed Below (If Applicable).  If you need a refill on your cardiac medications before your next appointment, please call your pharmacy.

## 2020-08-13 NOTE — Progress Notes (Signed)
PCP: Manon Hilding, MD   Primary EP: Dr Teressa Senter is a 87 y.o. male who presents today for routine electrophysiology followup.  Since last being seen in our clinic, the patient reports doing very well.  Today, he denies symptoms of palpitations, chest pain, shortness of breath,  lower extremity edema, dizziness, presyncope, or syncope.  The patient is otherwise without complaint today.   Past Medical History:  Diagnosis Date   Arthritis    Atrial fibrillation (HCC)    paroxysmal, s/p PVI by Dr Rayann Heman 8/12   Atrial flutter [facility])    s/p CTI ablation   Cancer [facility])    4th stage dysplasia mass in colon-removed 7/18   Dysrhythmia    A. fib/a. a. flutter   Hyperlipidemia    Obstructive sleep apnea (adult) (pediatric)    does not use cpap   Past Surgical History:  Procedure Laterality Date   ATRIAL ABLATION SURGERY     s/p CTI and PVI ablations by Dr Rayann Heman 8/12   ATRIAL FIBRILLATION ABLATION N/A 08/02/2017   Procedure: ATRIAL FIBRILLATION ABLATION;  Surgeon: [name], MD;  Location: [place] [facility];  Service: Cardiovascular;  Laterality: N/A;   CARDIAC CATHETERIZATION     nonobstructive CAD 2000   COLONOSCOPY WITH PROPOFOL N/A 08/24/2016   Procedure: COLONOSCOPY WITH PROPOFOL;  Surgeon: Teena Irani, MD;  [facility];  Service: Endoscopy;  Laterality: N/A;   INSERTION OF MESH N/A 02/28/2017   Procedure: INSERTION OF MESH;  Surgeon: Jovita Kussmaul, MD;  Location: [place];  Service: General;  Laterality: N/A;   LAPAROSCOPIC RIGHT COLECTOMY Right 08/28/2016   Procedure: LAPAROSCOPIC ASSISTED RIGHT COLECTOMY;  Surgeon: Jovita Kussmaul, MD;  [facility];  Service: General;  Laterality: Right;   LOOP RECORDER INSERTION N/A 11/21/2016   Procedure: LOOP RECORDER INSERTION;  Surgeon: [name], MD;  Location: [place] CV LAB;  Service: Cardiovascular;  Laterality: N/A;   MELANOMA EXCISION Right    shoulder   VENTRAL HERNIA REPAIR  02/28/2017   VENTRAL  HERNIA REPAIR N/A 02/28/2017   Procedure: LAPAROSCOPIC VENTRAL HERNIA REPAIR WITH MESH;  Surgeon: Jovita Kussmaul, MD;  Location: [place];  Service: General;  Laterality: N/A;    ROS- all systems are reviewed and negatives except as per HPI above  Current Outpatient Medications  Medication Sig Dispense Refill   acetaminophen (TYLENOL) 325 MG tablet Take 325 mg by mouth every 6 (six) hours as needed for mild pain.     allopurinol (ZYLOPRIM) 100 MG tablet Take 100 mg by mouth daily.      carvedilol (COREG) 12.5 MG tablet Take 1 tablet (12.5 mg total) by mouth 2 (two) times daily. 180 tablet 3   colchicine 0.6 MG tablet Take 0.6 mg by mouth as needed (Gout).      Multiple Vitamins-Minerals (MULTIVITAMIN WITH MINERALS) tablet Take 1 tablet by mouth daily.     Rivaroxaban (XARELTO) 15 MG TABS tablet Take 1 tablet (15 mg total) by mouth daily with supper. 90 tablet 3   No current facility-administered medications for this visit.    Physical Exam: Vitals:   08/13/20 1056  BP: 114/68  Pulse: 84  SpO2: 98%  Weight: 203 lb (92.1 kg)  Height: 5\' 10"  (1.778 m)    GEN- The patient is well appearing, alert and oriented x 3 today.   Head- normocephalic, atraumatic Eyes-  Sclera clear, conjunctiva pink Ears- hearing intact Oropharynx- clear Lungs- Clear to ausculation bilaterally, normal  work of breathing Heart- Regular rate and rhythm, no murmurs, rubs or gallops, PMI not laterally displaced GI- soft, NT, ND, + BS Extremities- no clubbing, cyanosis, or edema  Wt Readings from Last 3 Encounters:  08/13/20 203 lb (92.1 kg)  05/06/20 200 lb 12.8 oz (91.1 kg)  03/05/19 202 lb (91.6 kg)    EKG tracing ordered today is personally reviewed and shows afib  Assessment and Plan:  Paroxysmal afib Doing reasonably well Afib burden by ILR is 3.6% He would prefer to keep look (now at RRT) in place and think about the idea of replacement.  We will revisit in 6 months Continue xarelto for stroke  prevention He is in afib but mostly unaware.  2. HTN Stable No change required today  Return in 6 months  [name] MD, [facility] 08/13/2020 11:08 AM

## 2020-08-23 NOTE — Progress Notes (Signed)
Carelink Summary Report / Loop Recorder 

## 2020-10-07 DIAGNOSIS — Z23 Encounter for immunization: Secondary | ICD-10-CM | POA: Diagnosis not present

## 2020-12-08 DIAGNOSIS — Z23 Encounter for immunization: Secondary | ICD-10-CM | POA: Diagnosis not present

## 2021-02-07 DIAGNOSIS — E7801 Familial hypercholesterolemia: Secondary | ICD-10-CM | POA: Diagnosis not present

## 2021-02-07 DIAGNOSIS — I482 Chronic atrial fibrillation, unspecified: Secondary | ICD-10-CM | POA: Diagnosis not present

## 2021-02-07 DIAGNOSIS — R7301 Impaired fasting glucose: Secondary | ICD-10-CM | POA: Diagnosis not present

## 2021-02-07 DIAGNOSIS — E7800 Pure hypercholesterolemia, unspecified: Secondary | ICD-10-CM | POA: Diagnosis not present

## 2021-02-07 DIAGNOSIS — K2100 Gastro-esophageal reflux disease with esophagitis, without bleeding: Secondary | ICD-10-CM | POA: Diagnosis not present

## 2021-02-07 DIAGNOSIS — N183 Chronic kidney disease, stage 3 unspecified: Secondary | ICD-10-CM | POA: Diagnosis not present

## 2021-02-10 DIAGNOSIS — E7801 Familial hypercholesterolemia: Secondary | ICD-10-CM | POA: Diagnosis not present

## 2021-02-10 DIAGNOSIS — M109 Gout, unspecified: Secondary | ICD-10-CM | POA: Diagnosis not present

## 2021-02-10 DIAGNOSIS — D696 Thrombocytopenia, unspecified: Secondary | ICD-10-CM | POA: Diagnosis not present

## 2021-02-10 DIAGNOSIS — R7301 Impaired fasting glucose: Secondary | ICD-10-CM | POA: Diagnosis not present

## 2021-02-10 DIAGNOSIS — K2100 Gastro-esophageal reflux disease with esophagitis, without bleeding: Secondary | ICD-10-CM | POA: Diagnosis not present

## 2021-02-10 DIAGNOSIS — F411 Generalized anxiety disorder: Secondary | ICD-10-CM | POA: Diagnosis not present

## 2021-03-03 ENCOUNTER — Ambulatory Visit (INDEPENDENT_AMBULATORY_CARE_PROVIDER_SITE_OTHER): Payer: Medicare Other | Admitting: Internal Medicine

## 2021-03-03 ENCOUNTER — Encounter: Payer: Self-pay | Admitting: Internal Medicine

## 2021-03-03 ENCOUNTER — Other Ambulatory Visit: Payer: Self-pay

## 2021-03-03 VITALS — BP 110/68 | HR 91 | Ht 70.0 in | Wt 201.0 lb

## 2021-03-03 DIAGNOSIS — I10 Essential (primary) hypertension: Secondary | ICD-10-CM

## 2021-03-03 DIAGNOSIS — I480 Paroxysmal atrial fibrillation: Secondary | ICD-10-CM

## 2021-03-03 NOTE — Patient Instructions (Addendum)
Medication Instructions:  Your physician recommends that you continue on your current medications as directed. Please refer to the Current Medication list given to you today. *If you need a refill on your cardiac medications before your next appointment, please call your pharmacy*  Lab Work: None. If you have labs (blood work) drawn today and your tests are completely normal, you will receive your results only by: [place] (if you have MyChart) OR A paper copy in the mail If you have any lab test that is abnormal or we need to change your treatment, we will call you to review the results.  Testing/Procedures: None.  Follow-Up: At [facility], you and your health needs are our priority.  As part of our continuing mission to provide you with exceptional heart care, we have created designated Provider Care Teams.  These Care Teams include your primary Cardiologist (physician) and Advanced Practice Providers (APPs -  Physician Assistants and Nurse Practitioners) who all work together to provide you with the care you need, when you need it.  Your physician wants you to follow-up in: 12 months with Dr. [name]     You will receive a reminder letter in the mail two months in advance. If you don't receive a letter, please call our office to schedule the follow-up appointment.   We recommend signing up for the patient portal called "MyChart".  Sign up information is provided on this After Visit Summary.  MyChart is used to connect with patients for Virtual Visits (Telemedicine).  Patients are able to view lab/test results, encounter notes, upcoming appointments, etc.  Non-urgent messages can be sent to your provider as well.   To learn more about what you can do with MyChart, go to [email or web address].    Any Other Special Instructions Will Be Listed Below (If Applicable).

## 2021-03-03 NOTE — Progress Notes (Signed)
PCP: Manon Hilding, MD   Primary EP: Dr Teressa Senter is a 88 y.o. male who presents today for routine electrophysiology followup.  Since last being seen in our clinic, the patient reports doing very well.  He is in afib but unaware today.  Today, he denies symptoms of palpitations, chest pain, shortness of breath,  lower extremity edema, dizziness, presyncope, or syncope.  The patient is otherwise without complaint today.   Past Medical History:  Diagnosis Date   Arthritis    Atrial fibrillation (HCC)    paroxysmal, s/p PVI by Dr Rayann Heman 8/12   Atrial flutter [facility])    s/p CTI ablation   Cancer [facility])    4th stage dysplasia mass in colon-removed 7/18   Dysrhythmia    A. fib/a. a. flutter   Hyperlipidemia    Obstructive sleep apnea (adult) (pediatric)    does not use cpap   Past Surgical History:  Procedure Laterality Date   ATRIAL ABLATION SURGERY     s/p CTI and PVI ablations by Dr Rayann Heman 8/12   ATRIAL FIBRILLATION ABLATION N/A 08/02/2017   Procedure: ATRIAL FIBRILLATION ABLATION;  Surgeon: [name], MD;  Location: [place] [facility];  Service: Cardiovascular;  Laterality: N/A;   CARDIAC CATHETERIZATION     nonobstructive CAD 2000   COLONOSCOPY WITH PROPOFOL N/A 08/24/2016   Procedure: COLONOSCOPY WITH PROPOFOL;  Surgeon: Teena Irani, MD;  [facility];  Service: Endoscopy;  Laterality: N/A;   INSERTION OF MESH N/A 02/28/2017   Procedure: INSERTION OF MESH;  Surgeon: Jovita Kussmaul, MD;  Location: [place];  Service: General;  Laterality: N/A;   LAPAROSCOPIC RIGHT COLECTOMY Right 08/28/2016   Procedure: LAPAROSCOPIC ASSISTED RIGHT COLECTOMY;  Surgeon: Jovita Kussmaul, MD;  [facility];  Service: General;  Laterality: Right;   LOOP RECORDER INSERTION N/A 11/21/2016   Procedure: LOOP RECORDER INSERTION;  Surgeon: [name], MD;  Location: [place] [facility];  Service: Cardiovascular;  Laterality: N/A;   MELANOMA EXCISION Right    shoulder   VENTRAL HERNIA  REPAIR  02/28/2017   VENTRAL HERNIA REPAIR N/A 02/28/2017   Procedure: LAPAROSCOPIC VENTRAL HERNIA REPAIR WITH MESH;  Surgeon: Jovita Kussmaul, MD;  Location: [place];  Service: General;  Laterality: N/A;    ROS- all systems are reviewed and negatives except as per HPI above  Current Outpatient Medications  Medication Sig Dispense Refill   acetaminophen (TYLENOL) 325 MG tablet Take 325 mg by mouth every 6 (six) hours as needed for mild pain.     allopurinol (ZYLOPRIM) 100 MG tablet Take 100 mg by mouth daily.      carvedilol (COREG) 12.5 MG tablet Take 1 tablet (12.5 mg total) by mouth 2 (two) times daily. 180 tablet 3   colchicine 0.6 MG tablet Take 0.6 mg by mouth as needed (Gout).      Multiple Vitamins-Minerals (MULTIVITAMIN WITH MINERALS) tablet Take 1 tablet by mouth daily.     Rivaroxaban (XARELTO) 15 MG TABS tablet Take 1 tablet (15 mg total) by mouth daily with supper. 90 tablet 3   No current facility-administered medications for this visit.    Physical Exam: Vitals:   03/03/21 1018  BP: 110/68  Pulse: 91  SpO2: 99%  Weight: 201 lb (91.2 kg)  Height: 5\' 10"  (1.778 m)    GEN- The patient is well appearing, alert and oriented x 3 today.   Head- normocephalic, atraumatic Eyes-  Sclera clear, conjunctiva pink Ears- hearing intact  Oropharynx- clear Lungs- Clear to ausculation bilaterally, normal work of breathing Heart- iRRR GI- soft, NT, ND, + BS Extremities- no clubbing, cyanosis, or edema  Wt Readings from Last 3 Encounters:  03/03/21 201 lb (91.2 kg)  08/13/20 203 lb (92.1 kg)  05/06/20 200 lb 12.8 oz (91.1 kg)    EKG tracing ordered today is personally reviewed and shows afib, V rate 91 bpm  Assessment and Plan:  Paroxysmal atrial fibrillation ILR is no longer functioning We discussed removal and also replacement as options.  He is very clear that he does not wish to have a new device and also declines removal today. Continue xarelto for stroke prevention I  suspect that he is persistently in AF at this point.  He is mostly unaware. We will plan rate control as our long term strategy going forward.  2. HTN Stable No change required today   Return in a year unless problems arise in the interim.  [name] MD, [place] 03/03/2021 10:48 AM

## 2021-03-07 ENCOUNTER — Encounter: Payer: Medicare Other | Admitting: Internal Medicine

## 2021-05-12 ENCOUNTER — Other Ambulatory Visit: Payer: Self-pay | Admitting: Internal Medicine

## 2021-05-20 DIAGNOSIS — L570 Actinic keratosis: Secondary | ICD-10-CM | POA: Diagnosis not present

## 2021-08-04 ENCOUNTER — Other Ambulatory Visit: Payer: Self-pay | Admitting: Internal Medicine

## 2021-08-04 NOTE — Telephone Encounter (Signed)
Prescription refill request for Xarelto received.  Indication:Afib Last office visit:1/23 Weight:91.2 kg Age:88 Scr:1.4 CrCl:47.95 ml/min  Prescription refilled

## 2021-08-08 DIAGNOSIS — E7801 Familial hypercholesterolemia: Secondary | ICD-10-CM | POA: Diagnosis not present

## 2021-08-08 DIAGNOSIS — K2100 Gastro-esophageal reflux disease with esophagitis, without bleeding: Secondary | ICD-10-CM | POA: Diagnosis not present

## 2021-08-08 DIAGNOSIS — M109 Gout, unspecified: Secondary | ICD-10-CM | POA: Diagnosis not present

## 2021-08-08 DIAGNOSIS — E7800 Pure hypercholesterolemia, unspecified: Secondary | ICD-10-CM | POA: Diagnosis not present

## 2021-08-08 DIAGNOSIS — N183 Chronic kidney disease, stage 3 unspecified: Secondary | ICD-10-CM | POA: Diagnosis not present

## 2021-08-11 DIAGNOSIS — F411 Generalized anxiety disorder: Secondary | ICD-10-CM | POA: Diagnosis not present

## 2021-08-11 DIAGNOSIS — Z6828 Body mass index (BMI) 28.0-28.9, adult: Secondary | ICD-10-CM | POA: Diagnosis not present

## 2021-08-11 DIAGNOSIS — M109 Gout, unspecified: Secondary | ICD-10-CM | POA: Diagnosis not present

## 2021-08-11 DIAGNOSIS — D696 Thrombocytopenia, unspecified: Secondary | ICD-10-CM | POA: Diagnosis not present

## 2021-08-11 DIAGNOSIS — R7301 Impaired fasting glucose: Secondary | ICD-10-CM | POA: Diagnosis not present

## 2021-08-11 DIAGNOSIS — K2100 Gastro-esophageal reflux disease with esophagitis, without bleeding: Secondary | ICD-10-CM | POA: Diagnosis not present

## 2021-08-11 DIAGNOSIS — E7801 Familial hypercholesterolemia: Secondary | ICD-10-CM | POA: Diagnosis not present

## 2021-09-19 DIAGNOSIS — H524 Presbyopia: Secondary | ICD-10-CM | POA: Diagnosis not present

## 2021-09-19 DIAGNOSIS — H353131 Nonexudative age-related macular degeneration, bilateral, early dry stage: Secondary | ICD-10-CM | POA: Diagnosis not present

## 2021-11-07 DIAGNOSIS — E7801 Familial hypercholesterolemia: Secondary | ICD-10-CM | POA: Diagnosis not present

## 2021-11-07 DIAGNOSIS — N179 Acute kidney failure, unspecified: Secondary | ICD-10-CM | POA: Diagnosis not present

## 2021-11-07 DIAGNOSIS — Z13220 Encounter for screening for lipoid disorders: Secondary | ICD-10-CM | POA: Diagnosis not present

## 2021-11-07 DIAGNOSIS — E7800 Pure hypercholesterolemia, unspecified: Secondary | ICD-10-CM | POA: Diagnosis not present

## 2021-11-07 DIAGNOSIS — M109 Gout, unspecified: Secondary | ICD-10-CM | POA: Diagnosis not present

## 2021-11-07 DIAGNOSIS — R7301 Impaired fasting glucose: Secondary | ICD-10-CM | POA: Diagnosis not present

## 2021-11-10 DIAGNOSIS — Z0001 Encounter for general adult medical examination with abnormal findings: Secondary | ICD-10-CM | POA: Diagnosis not present

## 2021-11-10 DIAGNOSIS — F411 Generalized anxiety disorder: Secondary | ICD-10-CM | POA: Diagnosis not present

## 2021-11-10 DIAGNOSIS — Z6827 Body mass index (BMI) 27.0-27.9, adult: Secondary | ICD-10-CM | POA: Diagnosis not present

## 2021-11-10 DIAGNOSIS — D696 Thrombocytopenia, unspecified: Secondary | ICD-10-CM | POA: Diagnosis not present

## 2021-11-10 DIAGNOSIS — K2100 Gastro-esophageal reflux disease with esophagitis, without bleeding: Secondary | ICD-10-CM | POA: Diagnosis not present

## 2021-11-10 DIAGNOSIS — R7301 Impaired fasting glucose: Secondary | ICD-10-CM | POA: Diagnosis not present

## 2021-11-10 DIAGNOSIS — M109 Gout, unspecified: Secondary | ICD-10-CM | POA: Diagnosis not present

## 2021-11-10 DIAGNOSIS — E7801 Familial hypercholesterolemia: Secondary | ICD-10-CM | POA: Diagnosis not present

## 2021-12-20 DIAGNOSIS — Z23 Encounter for immunization: Secondary | ICD-10-CM | POA: Diagnosis not present

## 2022-03-06 ENCOUNTER — Encounter: Payer: Self-pay | Admitting: Cardiovascular Disease

## 2022-03-06 ENCOUNTER — Ambulatory Visit: Payer: Medicare Other | Attending: Cardiovascular Disease | Admitting: Cardiovascular Disease

## 2022-03-06 VITALS — BP 138/58 | HR 83 | Ht 70.0 in | Wt 199.0 lb

## 2022-03-06 DIAGNOSIS — I480 Paroxysmal atrial fibrillation: Secondary | ICD-10-CM | POA: Diagnosis not present

## 2022-03-06 MED ORDER — RIVAROXABAN 15 MG PO TABS: 15.0000 mg | ORAL_TABLET | Freq: Every day | ORAL | 3 refills | Status: DC

## 2022-03-06 NOTE — Progress Notes (Signed)
PCP: Manon Hilding, MD   Primary EP: Dr Annie Sable is a 89 y.o. male who presents today for routine electrophysiology followup.  Since last being seen in our clinic, the patient reports doing very well.  He is in afib but unaware today.  Today, he denies symptoms of palpitations, chest pain, shortness of breath,  lower extremity edema, dizziness, presyncope, or syncope.  The patient is otherwise without complaint today.   Past Medical History:  Diagnosis Date   Arthritis    Atrial fibrillation (HCC)    paroxysmal, s/p PVI by Dr Rayann Heman 8/12   Atrial flutter [facility])    s/p CTI ablation   Cancer [facility])    4th stage dysplasia mass in colon-removed 7/18   Dysrhythmia    A. fib/a. a. flutter   Hyperlipidemia    Obstructive sleep apnea (adult) (pediatric)    does not use cpap   Past Surgical History:  Procedure Laterality Date   ATRIAL ABLATION SURGERY     s/p CTI and PVI ablations by Dr Rayann Heman 8/12   ATRIAL FIBRILLATION ABLATION N/A 08/02/2017   Procedure: ATRIAL FIBRILLATION ABLATION;  Surgeon: [name], MD;  Location: [place] [facility];  Service: Cardiovascular;  Laterality: N/A;   CARDIAC CATHETERIZATION     nonobstructive CAD 2000   COLONOSCOPY WITH PROPOFOL N/A 08/24/2016   Procedure: COLONOSCOPY WITH PROPOFOL;  Surgeon: Teena Irani, MD;  [facility];  Service: Endoscopy;  Laterality: N/A;   INSERTION OF MESH N/A 02/28/2017   Procedure: INSERTION OF MESH;  Surgeon: Jovita Kussmaul, MD;  Location: [place];  Service: General;  Laterality: N/A;   LAPAROSCOPIC RIGHT COLECTOMY Right 08/28/2016   Procedure: LAPAROSCOPIC ASSISTED RIGHT COLECTOMY;  Surgeon: Jovita Kussmaul, MD;  [facility];  Service: General;  Laterality: Right;   LOOP RECORDER INSERTION N/A 11/21/2016   Procedure: LOOP RECORDER INSERTION;  Surgeon: [name], MD;  Location: [place] [facility];  Service: Cardiovascular;  Laterality: N/A;   MELANOMA EXCISION Right    shoulder   VENTRAL HERNIA  REPAIR  02/28/2017   VENTRAL HERNIA REPAIR N/A 02/28/2017   Procedure: LAPAROSCOPIC VENTRAL HERNIA REPAIR WITH MESH;  Surgeon: Jovita Kussmaul, MD;  Location: [place];  Service: General;  Laterality: N/A;    ROS- all systems are reviewed and negatives except as per HPI above  Current Outpatient Medications  Medication Sig Dispense Refill   acetaminophen (TYLENOL) 325 MG tablet Take 325 mg by mouth every 6 (six) hours as needed for mild pain.     allopurinol (ZYLOPRIM) 100 MG tablet Take 100 mg by mouth daily.      carvedilol (COREG) 12.5 MG tablet Take 1 tablet by mouth twice daily 180 tablet 3   colchicine 0.6 MG tablet Take 0.6 mg by mouth as needed (Gout).      Multiple Vitamins-Minerals (MULTIVITAMIN WITH MINERALS) tablet Take 1 tablet by mouth daily.     Rivaroxaban (XARELTO) 15 MG TABS tablet TAKE 1 TABLET BY MOUTH ONCE DAILY WITH SUPPER 90 tablet 1   No current facility-administered medications for this visit.    Physical Exam: Vitals:   03/06/22 1128  BP: (!) 138/58  Pulse: 83  SpO2: 99%  Weight: 199 lb (90.3 kg)  Height: '5\' 10"'$  (1.778 m)    GEN- The patient is well appearing, alert and oriented x 3 today.   Head- normocephalic, atraumatic Eyes-  Sclera clear, conjunctiva pink Ears- hearing intact Oropharynx- clear Lungs- Clear to ausculation  bilaterally, normal work of breathing Heart- iRRR GI- soft, NT, ND, + BS Extremities- no clubbing, cyanosis, or edema  Wt Readings from Last 3 Encounters:  03/06/22 199 lb (90.3 kg)  03/03/21 201 lb (91.2 kg)  08/13/20 203 lb (92.1 kg)    EKG tracing ordered today is personally reviewed and shows afib, V rate 91 bpm  Assessment and Plan:  Permanent atrial fibrillation ILR is no longer functioning -- he does not want it removed  2. Secondary hypercoagulable state: Continue Xarelto.  He has labs from PCP today that show normal hemoglobin and renal function.  2. HTN Stable No change required today   Return in a year  unless problems arise in the interim.  Melida Quitter, MD  03/06/2022 12:10 PM

## 2022-03-06 NOTE — Patient Instructions (Addendum)
Medication Instructions:  Your physician recommends that you continue on your current medications as directed. Please refer to the Current Medication list given to you today.  *If you need a refill on your cardiac medications before your next appointment, please call your pharmacy*  Follow-Up: At [facility], you and your health needs are our priority.  As part of our continuing mission to provide you with exceptional heart care, we have created designated Provider Care Teams.  These Care Teams include your primary Cardiologist (physician) and Advanced Practice Providers (APPs -  Physician Assistants and Nurse Practitioners) who all work together to provide you with the care you need, when you need it.    Your next appointment:   1 year(s)  The format for your next appointment:   In Person  Provider:   You may see Melida Quitter, MD or one of the following Advanced Practice Providers on your designated Care Team:   Tommye Standard, [place] Legrand Como "[place]" [place], [place] {If Card or EP not listed click to update   DO  Important Information About Sugar

## 2022-03-16 DIAGNOSIS — N183 Chronic kidney disease, stage 3 unspecified: Secondary | ICD-10-CM | POA: Diagnosis not present

## 2022-03-16 DIAGNOSIS — E7801 Familial hypercholesterolemia: Secondary | ICD-10-CM | POA: Diagnosis not present

## 2022-03-16 DIAGNOSIS — R7301 Impaired fasting glucose: Secondary | ICD-10-CM | POA: Diagnosis not present

## 2022-03-16 DIAGNOSIS — E7800 Pure hypercholesterolemia, unspecified: Secondary | ICD-10-CM | POA: Diagnosis not present

## 2022-03-16 DIAGNOSIS — M109 Gout, unspecified: Secondary | ICD-10-CM | POA: Diagnosis not present

## 2022-03-16 DIAGNOSIS — K2100 Gastro-esophageal reflux disease with esophagitis, without bleeding: Secondary | ICD-10-CM | POA: Diagnosis not present

## 2022-03-20 DIAGNOSIS — R7301 Impaired fasting glucose: Secondary | ICD-10-CM | POA: Diagnosis not present

## 2022-03-20 DIAGNOSIS — N1831 Chronic kidney disease, stage 3a: Secondary | ICD-10-CM | POA: Diagnosis not present

## 2022-03-20 DIAGNOSIS — E7800 Pure hypercholesterolemia, unspecified: Secondary | ICD-10-CM | POA: Diagnosis not present

## 2022-03-20 DIAGNOSIS — Z23 Encounter for immunization: Secondary | ICD-10-CM | POA: Diagnosis not present

## 2022-03-20 DIAGNOSIS — M109 Gout, unspecified: Secondary | ICD-10-CM | POA: Diagnosis not present

## 2022-03-20 DIAGNOSIS — F411 Generalized anxiety disorder: Secondary | ICD-10-CM | POA: Diagnosis not present

## 2022-03-20 DIAGNOSIS — R030 Elevated blood-pressure reading, without diagnosis of hypertension: Secondary | ICD-10-CM | POA: Diagnosis not present

## 2022-03-20 DIAGNOSIS — R7303 Prediabetes: Secondary | ICD-10-CM | POA: Diagnosis not present

## 2022-03-20 DIAGNOSIS — D696 Thrombocytopenia, unspecified: Secondary | ICD-10-CM | POA: Diagnosis not present

## 2022-03-20 DIAGNOSIS — K2100 Gastro-esophageal reflux disease with esophagitis, without bleeding: Secondary | ICD-10-CM | POA: Diagnosis not present

## 2022-03-20 DIAGNOSIS — Z6828 Body mass index (BMI) 28.0-28.9, adult: Secondary | ICD-10-CM | POA: Diagnosis not present

## 2022-05-23 DIAGNOSIS — D239 Other benign neoplasm of skin, unspecified: Secondary | ICD-10-CM | POA: Diagnosis not present

## 2022-05-23 DIAGNOSIS — L570 Actinic keratosis: Secondary | ICD-10-CM | POA: Diagnosis not present

## 2022-05-23 DIAGNOSIS — Z85820 Personal history of malignant melanoma of skin: Secondary | ICD-10-CM | POA: Diagnosis not present

## 2022-05-23 DIAGNOSIS — Z85828 Personal history of other malignant neoplasm of skin: Secondary | ICD-10-CM | POA: Diagnosis not present

## 2022-09-18 DIAGNOSIS — M109 Gout, unspecified: Secondary | ICD-10-CM | POA: Diagnosis not present

## 2022-09-18 DIAGNOSIS — E7800 Pure hypercholesterolemia, unspecified: Secondary | ICD-10-CM | POA: Diagnosis not present

## 2022-09-18 DIAGNOSIS — R7303 Prediabetes: Secondary | ICD-10-CM | POA: Diagnosis not present

## 2022-09-18 DIAGNOSIS — E7801 Familial hypercholesterolemia: Secondary | ICD-10-CM | POA: Diagnosis not present

## 2022-09-18 DIAGNOSIS — N183 Chronic kidney disease, stage 3 unspecified: Secondary | ICD-10-CM | POA: Diagnosis not present

## 2022-09-21 DIAGNOSIS — K2100 Gastro-esophageal reflux disease with esophagitis, without bleeding: Secondary | ICD-10-CM | POA: Diagnosis not present

## 2022-09-21 DIAGNOSIS — F411 Generalized anxiety disorder: Secondary | ICD-10-CM | POA: Diagnosis not present

## 2022-09-21 DIAGNOSIS — Z6828 Body mass index (BMI) 28.0-28.9, adult: Secondary | ICD-10-CM | POA: Diagnosis not present

## 2022-09-21 DIAGNOSIS — D696 Thrombocytopenia, unspecified: Secondary | ICD-10-CM | POA: Diagnosis not present

## 2022-09-21 DIAGNOSIS — N1831 Chronic kidney disease, stage 3a: Secondary | ICD-10-CM | POA: Diagnosis not present

## 2022-09-21 DIAGNOSIS — M109 Gout, unspecified: Secondary | ICD-10-CM | POA: Diagnosis not present

## 2022-09-21 DIAGNOSIS — E1122 Type 2 diabetes mellitus with diabetic chronic kidney disease: Secondary | ICD-10-CM | POA: Diagnosis not present

## 2022-09-21 DIAGNOSIS — R7301 Impaired fasting glucose: Secondary | ICD-10-CM | POA: Diagnosis not present

## 2022-09-21 DIAGNOSIS — R030 Elevated blood-pressure reading, without diagnosis of hypertension: Secondary | ICD-10-CM | POA: Diagnosis not present

## 2022-09-21 DIAGNOSIS — E7800 Pure hypercholesterolemia, unspecified: Secondary | ICD-10-CM | POA: Diagnosis not present

## 2022-10-27 DIAGNOSIS — H353131 Nonexudative age-related macular degeneration, bilateral, early dry stage: Secondary | ICD-10-CM | POA: Diagnosis not present

## 2022-10-27 DIAGNOSIS — H524 Presbyopia: Secondary | ICD-10-CM | POA: Diagnosis not present

## 2022-10-27 DIAGNOSIS — Z961 Presence of intraocular lens: Secondary | ICD-10-CM | POA: Diagnosis not present

## 2022-11-29 DIAGNOSIS — H903 Sensorineural hearing loss, bilateral: Secondary | ICD-10-CM | POA: Diagnosis not present

## 2022-12-18 DIAGNOSIS — Z23 Encounter for immunization: Secondary | ICD-10-CM | POA: Diagnosis not present

## 2023-03-16 DIAGNOSIS — M109 Gout, unspecified: Secondary | ICD-10-CM | POA: Diagnosis not present

## 2023-03-16 DIAGNOSIS — R7301 Impaired fasting glucose: Secondary | ICD-10-CM | POA: Diagnosis not present

## 2023-03-16 DIAGNOSIS — E7800 Pure hypercholesterolemia, unspecified: Secondary | ICD-10-CM | POA: Diagnosis not present

## 2023-03-16 DIAGNOSIS — E1122 Type 2 diabetes mellitus with diabetic chronic kidney disease: Secondary | ICD-10-CM | POA: Diagnosis not present

## 2023-03-16 DIAGNOSIS — E7849 Other hyperlipidemia: Secondary | ICD-10-CM | POA: Diagnosis not present

## 2023-03-22 DIAGNOSIS — R030 Elevated blood-pressure reading, without diagnosis of hypertension: Secondary | ICD-10-CM | POA: Diagnosis not present

## 2023-03-22 DIAGNOSIS — Z6828 Body mass index (BMI) 28.0-28.9, adult: Secondary | ICD-10-CM | POA: Diagnosis not present

## 2023-03-22 DIAGNOSIS — Z0001 Encounter for general adult medical examination with abnormal findings: Secondary | ICD-10-CM | POA: Diagnosis not present

## 2023-03-23 DIAGNOSIS — E1122 Type 2 diabetes mellitus with diabetic chronic kidney disease: Secondary | ICD-10-CM | POA: Diagnosis not present

## 2023-03-23 DIAGNOSIS — M109 Gout, unspecified: Secondary | ICD-10-CM | POA: Diagnosis not present

## 2023-03-23 DIAGNOSIS — D696 Thrombocytopenia, unspecified: Secondary | ICD-10-CM | POA: Diagnosis not present

## 2023-03-23 DIAGNOSIS — I482 Chronic atrial fibrillation, unspecified: Secondary | ICD-10-CM | POA: Diagnosis not present

## 2023-03-23 DIAGNOSIS — Z6828 Body mass index (BMI) 28.0-28.9, adult: Secondary | ICD-10-CM | POA: Diagnosis not present

## 2023-04-30 ENCOUNTER — Encounter: Payer: Self-pay | Admitting: Cardiovascular Disease

## 2023-04-30 ENCOUNTER — Ambulatory Visit: Attending: Cardiovascular Disease

## 2023-04-30 ENCOUNTER — Ambulatory Visit: Payer: Medicare Other | Attending: Cardiovascular Disease | Admitting: Cardiovascular Disease

## 2023-04-30 VITALS — BP 110/78 | HR 108 | Ht 70.0 in | Wt 195.2 lb

## 2023-04-30 DIAGNOSIS — I4892 Unspecified atrial flutter: Secondary | ICD-10-CM | POA: Diagnosis not present

## 2023-04-30 DIAGNOSIS — I4819 Other persistent atrial fibrillation: Secondary | ICD-10-CM

## 2023-04-30 DIAGNOSIS — I480 Paroxysmal atrial fibrillation: Secondary | ICD-10-CM | POA: Insufficient documentation

## 2023-04-30 MED ORDER — RIVAROXABAN 20 MG PO TABS: 20.0000 mg | ORAL_TABLET | Freq: Every day | ORAL | 3 refills | Status: AC

## 2023-04-30 NOTE — Progress Notes (Signed)
   PCP: Estanislado Pandy, MD   Primary EP: Dr Max Fickle is a 89 y.o. male who presents today for routine electrophysiology followup.  Since last being seen in our clinic, the patient reports doing very well.  He is in afib but unaware today.  Today, he denies symptoms of palpitations, chest pain, shortness of breath,  lower extremity edema, dizziness, presyncope, or syncope.  The patient is otherwise without complaint today.     Physical Exam: Vitals:   04/30/23 1055  BP: 110/78  Pulse: (!) 108  SpO2: 95%  Weight: 195 lb 3.2 oz (88.5 kg)  Height: 5\' 10"  (1.778 m)    GEN- The patient is well appearing, alert and oriented x 3 today.   Head- normocephalic, atraumatic Eyes-  Sclera clear, conjunctiva pink Ears- hearing intact Oropharynx- clear Lungs- Clear to ausculation bilaterally, normal work of breathing Heart- regular, tachy rhythm GI- soft, NT, ND, + BS Extremities- no clubbing, cyanosis, or edema  Wt Readings from Last 3 Encounters:  04/30/23 195 lb 3.2 oz (88.5 kg)  03/06/22 199 lb (90.3 kg)  03/03/21 201 lb (91.2 kg)    EKG tracing ordered today is personally reviewed and shows afib, V rate 91 bpm  Assessment and Plan:  Permanent atrial fibrillation, atypical atrial flutter ILR is no longer functioning -- he does not want it removed Rates in clinic today at 108 bpm Will place short-term monitor to assess rate control He did not tolerate carvedilol 12.5 in the past due to dizziness and is back on 6.25 If rates are consistently above 100 bpm, would consider transitioning to metoprolol  2. Secondary hypercoagulable state: Continue Xarelto --will refill today at correct dose of 20 mg he has labs from PCP March 16, 2023 that show normal hemoglobin and slightly depressed renal function.  2. HTN Stable No change required today   Return in a year unless problems arise in the interim.  Maurice Small, MD  04/30/2023 11:17 AM

## 2023-04-30 NOTE — Patient Instructions (Signed)
 Medication Instructions:  INCREASE Xarelto to 20 mg daily  *If you need a refill on your cardiac medications before your next appointment, please call your pharmacy*   Testing/Procedures: Zio Cardiac Monitor - this will be mailed to you via [employer] Your physician has recommended that you wear an event monitor. Event monitors are medical devices that record the heart's electrical activity. Doctors most often [place] these monitors to diagnose arrhythmias. Arrhythmias are problems with the speed or rhythm of the heartbeat. The monitor is a small, portable device. You can wear one while you do your normal daily activities. This is usually used to diagnose what is causing palpitations/syncope (passing out).   Follow-Up: At [facility], you and your health needs are our priority.  As part of our continuing mission to provide you with exceptional heart care, we have created designated Provider Care Teams.  These Care Teams include your primary Cardiologist (physician) and Advanced Practice Providers (APPs -  Physician Assistants and Nurse Practitioners) who all work together to provide you with the care you need, when you need it.  We recommend signing up for the patient portal called "MyChart".  Sign up information is provided on this After Visit Summary.  MyChart is used to connect with patients for Virtual Visits (Telemedicine).  Patients are able to view lab/test results, encounter notes, upcoming appointments, etc.  Non-urgent messages can be sent to your provider as well.   To learn more about what you can do with MyChart, go to [email or web address].    Your next appointment:   6 - 8 week(s)  Provider:   York Pellant, MD

## 2023-04-30 NOTE — Progress Notes (Unsigned)
 Enrolled for [employer] to mail a ZIO XT long term holter monitor to the patients address on file.

## 2023-05-22 DIAGNOSIS — Z85820 Personal history of malignant melanoma of skin: Secondary | ICD-10-CM | POA: Diagnosis not present

## 2023-05-22 DIAGNOSIS — L821 Other seborrheic keratosis: Secondary | ICD-10-CM | POA: Diagnosis not present

## 2023-05-22 DIAGNOSIS — Z85828 Personal history of other malignant neoplasm of skin: Secondary | ICD-10-CM | POA: Diagnosis not present

## 2023-05-22 DIAGNOSIS — L570 Actinic keratosis: Secondary | ICD-10-CM | POA: Diagnosis not present

## 2023-05-23 DIAGNOSIS — I4892 Unspecified atrial flutter: Secondary | ICD-10-CM | POA: Diagnosis not present

## 2023-05-23 DIAGNOSIS — I480 Paroxysmal atrial fibrillation: Secondary | ICD-10-CM | POA: Diagnosis not present

## 2023-06-09 DIAGNOSIS — I4892 Unspecified atrial flutter: Secondary | ICD-10-CM

## 2023-06-09 DIAGNOSIS — I480 Paroxysmal atrial fibrillation: Secondary | ICD-10-CM

## 2023-06-09 DIAGNOSIS — I4819 Other persistent atrial fibrillation: Secondary | ICD-10-CM

## 2023-06-11 ENCOUNTER — Encounter: Payer: Self-pay | Admitting: Cardiovascular Disease

## 2023-06-11 ENCOUNTER — Ambulatory Visit: Attending: Cardiovascular Disease | Admitting: Cardiovascular Disease

## 2023-06-11 VITALS — BP 122/72 | HR 77 | Ht 70.0 in | Wt 195.6 lb

## 2023-06-11 DIAGNOSIS — I4819 Other persistent atrial fibrillation: Secondary | ICD-10-CM | POA: Insufficient documentation

## 2023-06-11 NOTE — Progress Notes (Signed)
   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  Since last being seen in our clinic, the patient reports doing very well.  He is in afib but unaware today.  Today, he denies symptoms of palpitations, chest pain, shortness of breath,  lower extremity edema, dizziness, presyncope, or syncope.  The patient is otherwise without complaint today.   He presents to follow-up regarding the monitor he recently wore.  3 day monitor: April 2025 Atrial fibrillation and flutter HR 50-116 bpm, AVG 77 bpm One 3.4s pause occurred at 11:14 PM   Physical Exam: Vitals:   06/11/23 1041  BP: 122/72  Pulse: 77  SpO2: 98%  Weight: 195 lb 9.6 oz (88.7 kg)  Height: 5\' 10"  (1.778 m)    GEN- The patient is well appearing, alert and oriented x 3 today.   Head- normocephalic, atraumatic Eyes-  Sclera clear, conjunctiva pink Ears- hearing intact Oropharynx- clear Lungs- Clear to ausculation bilaterally, normal work of breathing Heart- regular, tachy rhythm GI- soft, NT, ND, + BS Extremities- no clubbing, cyanosis, or edema  Wt Readings from Last 3 Encounters:  06/11/23 195 lb 9.6 oz (88.7 kg)  04/30/23 195 lb 3.2 oz (88.5 kg)  03/06/22 199 lb (90.3 kg)    EKG tracing ordered today is personally reviewed and shows afib, V rate 91 bpm  Assessment and Plan:  Permanent atrial fibrillation, atypical atrial flutter ILR is no longer functioning -- he does not want it removed Outpatient monitoring shows an average rate of 77 bpm (50-116 bpm) He did not tolerate carvedilol 12.5 in the past due to dizziness and is back on 6.25 With rate controlled permanent AF, I think he may be discharged from EP clinic and follow-up with his PCP for carvedilol and anticoagulation. I will be happy to see him again if new problems arise -- eg. Development of RVR or symptomatic pauses.   2. Secondary hypercoagulable  state: Continue Xarelto 20mg    2. HTN Stable No change required today     Efraim Grange, MD  06/11/2023 10:50 AM

## 2023-06-11 NOTE — Patient Instructions (Signed)
 Medication Instructions:  Your physician recommends that you continue on your current medications as directed. Please refer to the Current Medication list given to you today.  *If you need a refill on your cardiac medications before your next appointment, please call your pharmacy*   Follow-Up: At [facility], you and your health needs are our priority.  As part of our continuing mission to provide you with exceptional heart care, our providers are all part of one team.  This team includes your primary Cardiologist (physician) and Advanced Practice Providers or APPs (Physician Assistants and Nurse Practitioners) who all work together to provide you with the care you need, when you need it.  Your next appointment:   As needed with Dr. Arlester Ladd    1st Floor: - Lobby - Registration  - Pharmacy  - Lab - Cafe  2nd Floor: - PV Lab - Diagnostic Testing (echo, CT, nuclear med)  3rd Floor: - Vacant  4th Floor: - TCTS (cardiothoracic surgery) - AFib Clinic - Structural Heart Clinic - Vascular Surgery  - Vascular Ultrasound  5th Floor: - HeartCare Cardiology (general and EP) - Clinical Pharmacy for coumadin, hypertension, lipid, weight-loss medications, and med management appointments    Valet parking services will be available as well.

## 2023-09-18 DIAGNOSIS — M109 Gout, unspecified: Secondary | ICD-10-CM | POA: Diagnosis not present

## 2023-09-18 DIAGNOSIS — E1122 Type 2 diabetes mellitus with diabetic chronic kidney disease: Secondary | ICD-10-CM | POA: Diagnosis not present

## 2023-09-18 DIAGNOSIS — R7301 Impaired fasting glucose: Secondary | ICD-10-CM | POA: Diagnosis not present

## 2023-09-18 DIAGNOSIS — Z0001 Encounter for general adult medical examination with abnormal findings: Secondary | ICD-10-CM | POA: Diagnosis not present

## 2023-09-18 DIAGNOSIS — E7800 Pure hypercholesterolemia, unspecified: Secondary | ICD-10-CM | POA: Diagnosis not present

## 2023-09-18 DIAGNOSIS — E7849 Other hyperlipidemia: Secondary | ICD-10-CM | POA: Diagnosis not present

## 2023-09-25 DIAGNOSIS — I482 Chronic atrial fibrillation, unspecified: Secondary | ICD-10-CM | POA: Diagnosis not present

## 2023-09-25 DIAGNOSIS — Z23 Encounter for immunization: Secondary | ICD-10-CM | POA: Diagnosis not present

## 2023-09-25 DIAGNOSIS — N1831 Chronic kidney disease, stage 3a: Secondary | ICD-10-CM | POA: Diagnosis not present

## 2023-09-25 DIAGNOSIS — Z6828 Body mass index (BMI) 28.0-28.9, adult: Secondary | ICD-10-CM | POA: Diagnosis not present

## 2023-09-25 DIAGNOSIS — M109 Gout, unspecified: Secondary | ICD-10-CM | POA: Diagnosis not present

## 2023-09-25 DIAGNOSIS — K2100 Gastro-esophageal reflux disease with esophagitis, without bleeding: Secondary | ICD-10-CM | POA: Diagnosis not present

## 2023-11-27 DIAGNOSIS — Z23 Encounter for immunization: Secondary | ICD-10-CM | POA: Diagnosis not present

## 2023-12-11 DIAGNOSIS — H353131 Nonexudative age-related macular degeneration, bilateral, early dry stage: Secondary | ICD-10-CM | POA: Diagnosis not present

## 2023-12-11 DIAGNOSIS — Z961 Presence of intraocular lens: Secondary | ICD-10-CM | POA: Diagnosis not present

## 2023-12-11 DIAGNOSIS — H524 Presbyopia: Secondary | ICD-10-CM | POA: Diagnosis not present

## 2024-01-18 DIAGNOSIS — E7849 Other hyperlipidemia: Secondary | ICD-10-CM | POA: Diagnosis not present

## 2024-01-18 DIAGNOSIS — Z130 Encounter for screening for diseases of the blood and blood-forming organs and certain disorders involving the immune mechanism: Secondary | ICD-10-CM | POA: Diagnosis not present

## 2024-01-18 DIAGNOSIS — N1831 Chronic kidney disease, stage 3a: Secondary | ICD-10-CM | POA: Diagnosis not present

## 2024-01-31 DIAGNOSIS — M109 Gout, unspecified: Secondary | ICD-10-CM | POA: Diagnosis not present

## 2024-01-31 DIAGNOSIS — N1832 Chronic kidney disease, stage 3b: Secondary | ICD-10-CM | POA: Diagnosis not present

## 2024-01-31 DIAGNOSIS — Z9049 Acquired absence of other specified parts of digestive tract: Secondary | ICD-10-CM | POA: Diagnosis not present

## 2024-01-31 DIAGNOSIS — Z6828 Body mass index (BMI) 28.0-28.9, adult: Secondary | ICD-10-CM | POA: Diagnosis not present

## 2024-01-31 DIAGNOSIS — E1122 Type 2 diabetes mellitus with diabetic chronic kidney disease: Secondary | ICD-10-CM | POA: Diagnosis not present

## 2024-01-31 DIAGNOSIS — I482 Chronic atrial fibrillation, unspecified: Secondary | ICD-10-CM | POA: Diagnosis not present

## 2024-01-31 DIAGNOSIS — D696 Thrombocytopenia, unspecified: Secondary | ICD-10-CM | POA: Diagnosis not present

## 2024-07-17 ENCOUNTER — Ambulatory Visit: Admitting: Cardiovascular Disease
